# Patient Record
Sex: Female | Born: 1980
Health system: Southern US, Community
[De-identification: ages and names within clinical notes are randomized; demographics above are authoritative.]

## PROBLEM LIST (undated history)

## (undated) DIAGNOSIS — I10 Essential (primary) hypertension: Secondary | ICD-10-CM

## (undated) DIAGNOSIS — J189 Pneumonia, unspecified organism: Secondary | ICD-10-CM

## (undated) DIAGNOSIS — J302 Other seasonal allergic rhinitis: Secondary | ICD-10-CM

## (undated) DIAGNOSIS — E785 Hyperlipidemia, unspecified: Secondary | ICD-10-CM

## (undated) HISTORY — PX: BREAST BIOPSY: SHX20

## (undated) HISTORY — DX: Hyperlipidemia, unspecified: E78.5

## (undated) HISTORY — PX: TUBAL LIGATION: SHX77

## (undated) HISTORY — DX: Essential (primary) hypertension: I10

---

## 2009-05-02 ENCOUNTER — Encounter (INDEPENDENT_AMBULATORY_CARE_PROVIDER_SITE_OTHER): Payer: Self-pay | Admitting: *Deleted

## 2009-05-16 ENCOUNTER — Ambulatory Visit: Payer: Self-pay | Admitting: Family Medicine

## 2009-05-16 DIAGNOSIS — R42 Dizziness and giddiness: Secondary | ICD-10-CM | POA: Insufficient documentation

## 2009-05-16 LAB — CONVERTED CEMR LAB
BUN: 9 mg/dL (ref 6–23)
Basophils Absolute: 0 10*3/uL (ref 0.0–0.1)
Basophils Relative: 0.1 % (ref 0.0–3.0)
CO2: 28 meq/L (ref 19–32)
Calcium: 9 mg/dL (ref 8.4–10.5)
Chloride: 108 meq/L (ref 96–112)
Creatinine, Ser: 0.6 mg/dL (ref 0.4–1.2)
Eosinophils Absolute: 0.1 10*3/uL (ref 0.0–0.7)
Eosinophils Relative: 2.8 % (ref 0.0–5.0)
Folate: 9.8 ng/mL
GFR calc non Af Amer: 124.71 mL/min (ref >60)
Glucose, Bld: 77 mg/dL (ref 70–99)
HCT: 37.1 % (ref 36.0–46.0)
Hemoglobin: 12.7 g/dL (ref 12.0–15.0)
Lymphocytes Relative: 38.5 % (ref 12.0–46.0)
Lymphs Abs: 1.7 10*3/uL (ref 0.7–4.0)
MCHC: 34.3 g/dL (ref 30.0–36.0)
MCV: 90.4 fL (ref 78.0–100.0)
Monocytes Absolute: 0.3 10*3/uL (ref 0.1–1.0)
Monocytes Relative: 7.7 % (ref 3.0–12.0)
Neutro Abs: 2.3 10*3/uL (ref 1.4–7.7)
Neutrophils Relative %: 50.9 % (ref 43.0–77.0)
Platelets: 226 10*3/uL (ref 150.0–400.0)
Potassium: 3.8 meq/L (ref 3.5–5.1)
RBC: 4.11 M/uL (ref 3.87–5.11)
RDW: 12.2 % (ref 11.5–14.6)
Sodium: 141 meq/L (ref 135–145)
TSH: 1.46 microintl units/mL (ref 0.35–5.50)
Vitamin B-12: 172 pg/mL — ABNORMAL LOW (ref 211–911)
WBC: 4.4 10*3/uL — ABNORMAL LOW (ref 4.5–10.5)

## 2009-05-17 LAB — CONVERTED CEMR LAB: Vit D, 25-Hydroxy: 20 ng/mL — ABNORMAL LOW (ref 30–89)

## 2009-05-20 ENCOUNTER — Telehealth (INDEPENDENT_AMBULATORY_CARE_PROVIDER_SITE_OTHER): Payer: Self-pay | Admitting: *Deleted

## 2009-05-22 ENCOUNTER — Encounter: Payer: Self-pay | Admitting: Cardiology

## 2009-05-22 ENCOUNTER — Ambulatory Visit: Payer: Self-pay

## 2009-05-23 ENCOUNTER — Ambulatory Visit: Payer: Self-pay | Admitting: Family Medicine

## 2009-05-23 DIAGNOSIS — E538 Deficiency of other specified B group vitamins: Secondary | ICD-10-CM | POA: Insufficient documentation

## 2009-05-30 ENCOUNTER — Ambulatory Visit: Payer: Self-pay | Admitting: Family Medicine

## 2009-05-30 DIAGNOSIS — E559 Vitamin D deficiency, unspecified: Secondary | ICD-10-CM | POA: Insufficient documentation

## 2009-05-30 DIAGNOSIS — F411 Generalized anxiety disorder: Secondary | ICD-10-CM | POA: Insufficient documentation

## 2009-05-30 LAB — CONVERTED CEMR LAB: Calcium: 9.4 mg/dL (ref 8.4–10.5)

## 2009-06-04 ENCOUNTER — Telehealth (INDEPENDENT_AMBULATORY_CARE_PROVIDER_SITE_OTHER): Payer: Self-pay | Admitting: *Deleted

## 2009-06-04 LAB — CONVERTED CEMR LAB: Vit D, 25-Hydroxy: 45 ng/mL (ref 30–89)

## 2009-06-06 ENCOUNTER — Ambulatory Visit: Payer: Self-pay | Admitting: Family Medicine

## 2009-06-13 ENCOUNTER — Ambulatory Visit: Payer: Self-pay | Admitting: Family Medicine

## 2009-07-04 ENCOUNTER — Ambulatory Visit: Payer: Self-pay | Admitting: Family Medicine

## 2009-08-05 ENCOUNTER — Ambulatory Visit: Payer: Self-pay | Admitting: Family Medicine

## 2009-08-05 ENCOUNTER — Encounter (INDEPENDENT_AMBULATORY_CARE_PROVIDER_SITE_OTHER): Payer: Self-pay | Admitting: *Deleted

## 2010-11-14 ENCOUNTER — Ambulatory Visit
Admission: RE | Admit: 2010-11-14 | Discharge: 2010-11-14 | Payer: Self-pay | Source: Home / Self Care | Attending: Family Medicine | Admitting: Family Medicine

## 2010-11-18 NOTE — Assessment & Plan Note (Signed)
Summary: b12 shot/kdc  Nurse Visit   Allergies: No Known Drug Allergies  Medication Administration  Injection # 1:    Medication: Vit B12 1000 mcg    Diagnosis: B12 DEFICIENCY (ICD-266.2)    Route: IM    Site: L deltoid    Exp Date: 02/2011    Lot #: [id number]    Mfr: American Regent    Patient tolerated injection without complications    Given by: Floydene Flock CMA (June 13, 2009 9:51 AM)  Orders Added: 1)  Admin of Therapeutic Inj  intramuscular or subcutaneous [96372] 2)  Vit B12 1000 mcg [J3420]   Medication Administration  Injection # 1:    Medication: Vit B12 1000 mcg    Diagnosis: B12 DEFICIENCY (ICD-266.2)    Route: IM    Site: L deltoid    Exp Date: 02/2011    Lot #: [id number]    Mfr: American Regent    Patient tolerated injection without complications    Given by: Floydene Flock CMA (June 13, 2009 9:51 AM)  Orders Added: 1)  Admin of Therapeutic Inj  intramuscular or subcutaneous [96372] 2)  Vit B12 1000 mcg [J3420]

## 2010-11-18 NOTE — Assessment & Plan Note (Signed)
Summary: RTO 2 WEEKS.CBS b12/kdc   Vital Signs:  Patient profile:   30 year old female Weight:      162.2 pounds Pulse rate:   54 / minute BP sitting:   110 / 58  (left arm)  Vitals Entered By: Doristine Devoid (May 30, 2009 9:42 AM) CC: 2 WEEK ROA- no dizziness occassionally light headed   History of Present Illness: 30 yo woman here today for f/u on dizziness.  pt reports dizziness is gone.  was having nausea w/ Vit D- took daily rather than weekly.  no chest pain, SOB, bone pain.  still having disconnected feeling- feels 'empty and anxious', 'light and airy', 'like a wave coming over me'.  reports there is a lot of stress at home- reports that she may be headed for 2nd divorce at age 43.  has 3 kids involved.  states she and husband have thought about counseling but have never followed through.  pt is tearful during visit.  Problems Prior to Update: 1)  Anxiety  (ICD-300.00) 2)  Vitamin D Deficiency  (ICD-268.9) 3)  B12 Deficiency  (ICD-266.2) 4)  Dizziness  (ICD-780.4)  Current Medications (verified): 1)  Meclizine Hcl 25 Mg Tabs (Meclizine Hcl) .Marland Kitchen.. 1 By Mouth Q6 As Needed For Dizziness 2)  Vitamin D (Ergocalciferol) 50000 Unit Caps (Ergocalciferol) .... 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 Eyes:  Denies blurring and double vision. CV:  Denies lightheadness, near fainting, palpitations, shortness of breath with exertion, swelling of feet, and swelling of hands. GI:  Denies abdominal pain, nausea, and vomiting. MS:  Denies joint pain and cramps. Psych:  Complains of anxiety, depression, easily angered, easily tearful, and irritability.  Physical Exam  General:  Well-developed,well-nourished,in no  acute distress; alert,appropriate and cooperative throughout examination Lungs:  Normal respiratory effort, chest expands symmetrically. Lungs are clear to auscultation, no crackles or wheezes. Heart:  reg S1/S2, no M/R/G Pulses:  +2 carotid, radial, DP Extremities:  no C/C/E Psych:  tearful, obviously anxious, depressed   Impression & Recommendations:  Problem # 1:  DIZZINESS (ICD-780.4) Assessment Improved pt reports no additional episodes of dizziness.  holter monitor completed but no results available, will continue to follow. Her updated medication list for this problem includes:    Meclizine Hcl 25 Mg Tabs (Meclizine hcl) .Marland Kitchen... 1 by mouth q6 as needed for dizziness  Problem # 2:  VITAMIN D DEFICIENCY (ICD-268.9) Assessment: New pt repleted Vit D in 8 days rather than 8 weeks.  had some nausea and HAs while taking meds but now asymptomatic.  repeat EKG unchanged from baseline- no arrhythmia present.  check Vit D and Ca levels.  pt aware of medication mistake when called into pharmacy and accepted both my and nurse's apology. Orders: Venipuncture (78295) T-Vitamin D (25-Hydroxy) (62130-86578) TLB-Calcium (82310-CA)  Problem # 3:  ANXIETY (ICD-300.00) Assessment: New pt obviously upset about current marriage situation.  referred for counseling.  spent >30 minutes discussing current situation and pt's feelings about it.  pt in agreement that sxs of 'disconnected' are likely stress related.  will follow closely.  Complete Medication List: 1)  Meclizine Hcl 25 Mg Tabs (Meclizine hcl) .Marland Kitchen.. 1 by mouth q6 as needed for dizziness 2)  Vitamin D (ergocalciferol) 50000 Unit Caps (  Ergocalciferol) .... Take one tablet weekly  Other Orders: Admin of Therapeutic Inj  intramuscular or subcutaneous (86578) Vit B12 1000 mcg (I6962)  Patient Instructions: 1)  Please schedule a follow-up appointment in 6 weeks to follow up anxiety. 2)  Try and find a stress outlet 3)  We'll notify you of your  labs 4)  Hold off on your Caltrate for the next 6 weeks- until your next visit 5)  We'll call you when we get your Holter monitor results (they still haven't faxed them!) 6)  Please call and schedule some counseling w/ [facility] at [phone] 7)  Call with any questions or concerns 8)  HANG IN THERE!!!  You can do this!    Medication Administration  Injection # 1:    Medication: Vit B12 1000 mcg    Diagnosis: B12 DEFICIENCY (ICD-266.2)    Route: IM    Site: L deltoid    Exp Date: 12/2010    Lot #: [id number]    Mfr: American Regent    Patient tolerated injection without complications    Given by: Floydene Flock CMA (May 30, 2009 11:09 AM)  Orders Added: 1)  Venipuncture [24401] 2)  T-Vitamin D (25-Hydroxy) [phone] 3)  Admin of Therapeutic Inj  intramuscular or subcutaneous [96372] 4)  Vit B12 1000 mcg [J3420] 5)  TLB-Calcium [82310-CA] 6)  Est. Patient Level IV [03474]

## 2010-11-18 NOTE — Assessment & Plan Note (Signed)
Summary: rov/swh//b12 inj//lh   Vital Signs:  Patient profile:   30 year old female Weight:      161.8 pounds Pulse rate:   62 / minute BP sitting:   90 / 60  (left arm)  Vitals Entered By: Doristine Devoid (July 04, 2009 10:12 AM) CC: rov and b12 shot   History of Present Illness: 30 yo woman here today to f/u  1) anxiety- has not started counseling due to financial reasons.  she and husband are 'trying to be on our best behavior'.  'we're making a better effort to work on Therapist, music'.  denies any dizzy spells since she and her husband have been on better terms.  doesn't feel she needs meds at this time.  Problems Prior to Update: 1)  Anxiety  (ICD-300.00) 2)  Vitamin D Deficiency  (ICD-268.9) 3)  B12 Deficiency  (ICD-266.2) 4)  Dizziness  (ICD-780.4)  Current Medications (verified): 1)  Meclizine Hcl 25 Mg Tabs (Meclizine Hcl) .Marland Kitchen.. 1 By Mouth Q6 As Needed For Dizziness 2)  Vitamin D (Ergocalciferol) 50000 Unit Caps (Ergocalciferol) .... Take One Tablet Weekly  Allergies (verified): No Known Drug Allergies  Past History:  Social History: Last updated: 05/16/2009 married, has 3 children- (98, 99, 2002) works at [employer] as Production designer, theatre/television/film  Review of Systems      See HPI  Physical Exam  General:  Well-developed,well-nourished,in no acute distress; alert,appropriate and cooperative throughout examination Psych:  Oriented X3, memory intact for recent and remote, normally interactive, good eye contact, not anxious appearing, and not depressed appearing.     Impression & Recommendations:  Problem # 1:  ANXIETY (ICD-300.00) Assessment Unchanged pt's sxs much improved since working on things w/ husband.  no need for meds at this time.  has not started counseling.  will follow.  Complete Medication List: 1)  Meclizine Hcl 25 Mg Tabs (Meclizine hcl) .Marland Kitchen.. 1 by mouth q6 as needed for dizziness 2)  Vitamin D (ergocalciferol) 50000 Unit Caps (Ergocalciferol) .... Take one tablet  weekly  Other Orders: Vit B12 1000 mcg (J3420) Admin of Therapeutic Inj  intramuscular or subcutaneous (65784)  Patient Instructions: 1)  Schedule a nurse visit in 1 month for B12 injection 2)  Set up a physical at your convenience in the next 2 months- do not eat before this appt 3)  Call with any questions or concerns 4)  Hang in there and keep 'working' at it!   Medication Administration  Injection # 1:    Medication: Vit B12 1000 mcg    Diagnosis: B12 DEFICIENCY (ICD-266.2)    Route: IM    Site: R deltoid    Exp Date: 02/17/2011    Lot #: [id number]    Mfr: American Regent    Given by: Doristine Devoid (July 04, 2009 10:13 AM)  Orders Added: 1)  Vit B12 1000 mcg [J3420] 2)  Admin of Therapeutic Inj  intramuscular or subcutaneous [96372] 3)  Est. Patient Level III [95284]

## 2010-11-18 NOTE — Assessment & Plan Note (Signed)
Summary: b12 injection/swh  Nurse Visit   Allergies: No Known Drug Allergies  Medication Administration  Injection # 1:    Medication: Vit B12 1000 mcg    Diagnosis: B12 DEFICIENCY (ICD-266.2)    Route: IM    Site: R deltoid    Exp Date: 12/18/2010    Lot #: [id number]    Mfr: American Regent    Given by: Doristine Devoid (June 06, 2009 10:38 AM)  Orders Added: 1)  Vit B12 1000 mcg [J3420] 2)  Admin of Therapeutic Inj  intramuscular or subcutaneous [96372]   Medication Administration  Injection # 1:    Medication: Vit B12 1000 mcg    Diagnosis: B12 DEFICIENCY (ICD-266.2)    Route: IM    Site: R deltoid    Exp Date: 12/18/2010    Lot #: [id number]    Mfr: American Regent    Given by: Doristine Devoid (June 06, 2009 10:38 AM)  Orders Added: 1)  Vit B12 1000 mcg [J3420] 2)  Admin of Therapeutic Inj  intramuscular or subcutaneous [45409]

## 2010-11-18 NOTE — Assessment & Plan Note (Signed)
Summary: NEW TO EST.CBS - AWARE N/S   Vital Signs:  Patient profile:   30 year old female Height:      63.25 inches Weight:      159.6 pounds BMI:     28.15 Pulse rate:   64 / minute BP sitting:   120 / 60  (left arm)  Vitals Entered By: Doristine Devoid (May 16, 2009 9:52 AM) CC: new est- dizziness on and off xmar. went away but came back few weeks ago not associated w/ anything in particular   History of Present Illness: 30 yo woman here today to establish care.  hasn't had a regular doctor 'in quite some time'.  here today for dizziness.  [place] w/ [place] and while stepping off the bus got very pale, dizzy, weak, had a hard time focusing.  ate something and still felt shaky.  took a few hours to feel better.  had a few additional spells and then the sxs disappeared.  a few weeks ago sxs returned.  1st sxs was increased fatigue- would sleep as much as possible.  felt motivation to do anything was low.  1 week ago Monday pt felt shaky.  the next morning pt again felt weak, trouble walking w/out holding on, vision blurry.  decreased appetite.  sxs all seem to cluster around meals- breakfast, lunch.  yesterday felt nausea.  no vomiting.  no fevers.  Preventive Screening-Counseling & Management  Alcohol-Tobacco     Alcohol drinks/day: <1     Smoking Status: never  Caffeine-Diet-Exercise     Does Patient Exercise: no      Sexual History:  currently monogamous.        Drug Use:  never.    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 Eyes:  Denies blurring and double vision; during episodes vision w/ 'close in'. ENT:  Denies nosebleeds and ringing in ears. CV:  Denies chest pain or discomfort, shortness of breath with exertion, swelling of feet, and swelling of hands. Resp:  Denies cough and wheezing. GI:  Complains of loss of appetite and nausea; denies change in bowel habits and vomiting. MS:  Denies joint pain and muscle weakness. Neuro:  Complains of poor balance, sensation of room spinning, and visual disturbances; denies falling down, headaches, and seizures. Psych:  Complains of irritability; denies anxiety, depression, easily angered, and easily tearful.  Physical Exam  General:  Well-developed,well-nourished,in no acute distress; alert,appropriate and cooperative throughout examination Head:  Normocephalic and atraumatic without obvious abnormalities. No apparent alopecia or balding. Eyes:  No corneal or conjunctival inflammation noted. EOMI. Perrla. Funduscopic exam benign, without hemorrhages, exudates or papilledema. Vision grossly normal. Ears:  External ear exam shows no significant lesions or deformities.  Otoscopic examination reveals clear canals, tympanic membranes are intact bilaterally without bulging, retraction, inflammation or discharge. Hearing is grossly normal bilaterally. Nose:  External nasal examination shows no deformity or inflammation. Nasal mucosa are pink and moist without lesions or exudates. Mouth:  Oral mucosa and oropharynx without  lesions or exudates.  Teeth in good repair. Neck:  No deformities, masses, or tenderness noted. Lungs:  Normal respiratory effort, chest expands symmetrically. Lungs are clear to auscultation, no crackles or wheezes. Heart:  reg S1/S2, no M/R/G Abdomen:  soft, NT/ND, +BS Pulses:  +2 carotid, radial, DP Extremities:  no C/C/E Neurologic:  No cranial nerve deficits noted.  Station and gait are normal. Plantar reflexes are down-going bilaterally. DTRs are symmetrical throughout. Sensory, motor and coordinative functions appear intact.  tandem gait intact, (-) Romberg   Impression & Recommendations:  Problem # 1:  DIZZINESS (ICD-780.4) Assessment New pt's dizziness w/out clear etiology based on hx or PE.  EKG showed irregular sinus bradycardia.  will arrange for 48 hr holter monitor to r/o symptomatic bradycardia.  check labs to r/o DM, anemia, thyroid dz, vitamin deficiency.  encouraged small but frequent meals to avoid rapid changes in blood sugars.  reviewed supportive care and red flags that should prompt return.  Pt expresses understanding and is in agreement w/ this plan.  34 minutes spent w/ pt. Orders: Venipuncture (57846) T-Vitamin D (25-Hydroxy) [phone]) TLB-BMP (Basic Metabolic Panel-BMET) (80048-METABOL) TLB-CBC Platelet - w/Differential (85025-CBCD) TLB-TSH (Thyroid Stimulating Hormone) (84443-TSH) TLB-B12 + Folate Pnl ([id number]-D66/YQI) Cardiology Referral (Cardiology)  Her updated medication list for this problem includes:    Meclizine Hcl 25 Mg Tabs (Meclizine hcl) .Marland Kitchen... 1 by mouth q6 as needed for dizziness  Complete Medication List: 1)  Meclizine Hcl 25 Mg Tabs (Meclizine hcl) .Marland Kitchen.. 1 by mouth q6 as needed for dizziness  Patient Instructions: 1)  Please schedule a follow-up appointment in 2 weeks to discuss dizziness. 2)  Someone will contact you to arrange your holter monitor 3)  Make sure you are eating small amounts but frequently- this will avoid your blood sugar dropping low and then flooding your system 4)  Drink plenty of fluids- especially in this heat 5)  We'll notify you of your lab results 6)  Take the Meclizine as needed for dizziness 7)  Call with any questions or concerns 8)  Hang in there! Prescriptions: MECLIZINE HCL 25 MG TABS (MECLIZINE HCL) 1 by mouth Q6 as needed for dizziness  #45 x 0   Entered and  Authorized by:   Neena Rhymes MD   Signed by:   Neena Rhymes MD on 05/16/2009   Method used:   Print then Give to Patient   RxID:   [id number]

## 2010-11-18 NOTE — Procedures (Signed)
Summary: summary report  summary report   Imported By: Mirna Mires 06/03/2009 09:59:22  _____________________________________________________________________  External Attachment:    Type:   Image     Comment:   External Document

## 2010-11-18 NOTE — Progress Notes (Signed)
Summary: labs  Phone Note Outgoing Call   Call placed by: Doristine Devoid,  June 04, 2009 10:53 AM Call placed to: Patient Summary of Call: Vit D and Ca are both normal and in center of the normal range.  so despite taking the Vit D for 8 days in a row, her levels are normal and she should have no problems.  please call  Follow-up for Phone Call        patient aware of labs................Marland KitchenDoristine Devoid  June 04, 2009 10:53 AM

## 2010-11-18 NOTE — Letter (Signed)
Summary: [place] No Show Letter  [facility] [place]  [address] [place], [place] [address]   Phone: [phone]  Fax: [phone]    08/05/2009 MRN: [id number]  Rose Bell [address] [place], [place]  [address]   Dear Ms. Bjorn,   Our records indicate that you missed your scheduled appointment with ________Dr.Tabori________ on _____10/18/10______.  Please contact this office to reschedule your appointment as soon as possible.  It is important that you keep your scheduled appointments with your physician, so we can provide you the best care possible.  Please be advised that there may be a charge for "no show" appointments.    Sincerely,    at [facility]

## 2010-11-18 NOTE — Progress Notes (Signed)
Summary: discuss lab  Phone Note Outgoing Call   Call placed by: Doristine Devoid,  May 20, 2009 10:15 AM Summary of Call: pt needs 50,000 units of Vit D x8 weeks and then recheck labs.  B12 deficiency- can replete w/ injections weekly x1 month and then monthly or use the nasal spray weekly.  remainder of labs normal.  Follow-up for Phone Call        spoke with patient aware of labs and recommendations appt scheduled to for b12 shots..............Marland KitchenDoristine Devoid  May 20, 2009 10:16 AM     New/Updated Medications: VITAMIN D (ERGOCALCIFEROL) 50000 UNIT CAPS (ERGOCALCIFEROL) take one tablet daily Prescriptions: VITAMIN D (ERGOCALCIFEROL) 50000 UNIT CAPS (ERGOCALCIFEROL) take one tablet daily  #8 x 0   Entered by:   Doristine Devoid   Authorized by:   Neena Rhymes MD   Signed by:   Doristine Devoid on 05/20/2009   Method used:   Electronically to        [facility]. [phone]* (retail)       [address]       [place], [place]  [address]       Ph: [phone]       Fax: [phone]   RxID:   [id number]

## 2010-11-26 NOTE — Assessment & Plan Note (Signed)
Summary: FOR CONSULT/ ABOUT HER SON///PH   Vital Signs:  Patient profile:   30 year old female Weight:      154 pounds BMI:     27.16 Pulse rate:   62 / minute  Vitals Entered By: Doristine Devoid CMA (November 14, 2010 3:00 PM) CC: discuss son and ADHD   History of Present Illness: 30 yo woman here today for anxiety.  pt concerned about her son, age 26 (he will be a pt here).  reports he has always had borderline grades but this year he has dropped 'way below grade level'.  school wants him tested for ADHD.  this is upsetting to mom.  his behavior is disruptive, he can't focus.  mom reports he is active at home.  son attends [employer]- they are calling pt frequently.  also having stress w/ bad marriage, school, full time work- but is losing her job.  has again started panic attacks.  still not interested in meds.    Current Medications (verified): 1)  None  Allergies (verified): No Known Drug Allergies  Past History:  Past medical, surgical, family and social histories (including risk factors) reviewed for relevance to current acute and chronic problems.  Past Medical History: Reviewed history from 05/16/2009 and no changes required. none  Past Surgical History: Reviewed history from 05/16/2009 and no changes required. Tubal ligation  Family History: Reviewed history from 05/16/2009 and no changes required. CAD-no HTN-no DM-paternal aunt and uncle STROKE-no COLON CA-no BREAST CA-no mom w/ thyroid condition  Social History: Reviewed history from 05/16/2009 and no changes required. married, has 3 children- (98, 99, 2002) works at [employer] as Production designer, theatre/television/film  Review of Systems      See HPI  Physical Exam  General:  Well-developed,well-nourished,in no acute distress; alert,appropriate and cooperative throughout examination Psych:  Oriented X3, memory intact for recent and remote, normally interactive, good eye contact, rapid speech, mildly anxious.  not  depressed.   Impression & Recommendations:  Problem # 1:  ANXIETY (ICD-300.00) Assessment Deteriorated pt's anxiety previously well controlled but she is now having panic attacks again due to stress of losing her job, son's possible ADHD and problems w/ school.  she is still not interested in meds- 'i just wanted to talk to you'.  discussed her current stressors and possible strategies for dealing w/ them- including having son tested by the school system.  will follow pt's mood closely.  total time spent w/ pt- 31 minutes, >50% spent counseling pt.  Patient Instructions: 1)  Follow up at your convenience for your complete physical- do not eat before this appt 2)  Go ahead and proceed w/ school testing for your son 3)  Call if think you need to start meds for the anxiety 4)  You can do this!!!   Orders Added: 1)  Est. Patient Level IV [16109]

## 2011-05-25 ENCOUNTER — Encounter: Payer: Self-pay | Admitting: Family Medicine

## 2011-06-26 ENCOUNTER — Other Ambulatory Visit (HOSPITAL_COMMUNITY)
Admission: RE | Admit: 2011-06-26 | Discharge: 2011-06-26 | Disposition: A | Discharge status: Home / Self Care | Payer: 59 | Source: Ambulatory Visit | Attending: Family Medicine | Admitting: Family Medicine

## 2011-06-26 ENCOUNTER — Encounter: Payer: Self-pay | Admitting: Family Medicine

## 2011-06-26 ENCOUNTER — Ambulatory Visit (INDEPENDENT_AMBULATORY_CARE_PROVIDER_SITE_OTHER): Payer: 59 | Admitting: Family Medicine

## 2011-06-26 DIAGNOSIS — E559 Vitamin D deficiency, unspecified: Secondary | ICD-10-CM

## 2011-06-26 DIAGNOSIS — Z0000 Encounter for general adult medical examination without abnormal findings: Secondary | ICD-10-CM | POA: Insufficient documentation

## 2011-06-26 DIAGNOSIS — Z01419 Encounter for gynecological examination (general) (routine) without abnormal findings: Secondary | ICD-10-CM

## 2011-06-26 DIAGNOSIS — Z124 Encounter for screening for malignant neoplasm of cervix: Secondary | ICD-10-CM | POA: Insufficient documentation

## 2011-06-26 DIAGNOSIS — E538 Deficiency of other specified B group vitamins: Secondary | ICD-10-CM

## 2011-06-26 LAB — CBC WITH DIFFERENTIAL/PLATELET
Basophils Absolute: 0 10*3/uL (ref 0.0–0.1)
Basophils Relative: 0.5 % (ref 0.0–3.0)
Eosinophils Absolute: 0.2 10*3/uL (ref 0.0–0.7)
Eosinophils Relative: 2.9 % (ref 0.0–5.0)
HCT: 36.8 % (ref 36.0–46.0)
Hemoglobin: 12.3 g/dL (ref 12.0–15.0)
Lymphocytes Relative: 36.3 % (ref 12.0–46.0)
Lymphs Abs: 1.9 10*3/uL (ref 0.7–4.0)
MCHC: 33.5 g/dL (ref 30.0–36.0)
MCV: 92.6 fl (ref 78.0–100.0)
Monocytes Absolute: 0.4 10*3/uL (ref 0.1–1.0)
Monocytes Relative: 7.2 % (ref 3.0–12.0)
Neutro Abs: 2.7 10*3/uL (ref 1.4–7.7)
Neutrophils Relative %: 53.1 % (ref 43.0–77.0)
Platelets: 235 10*3/uL (ref 150.0–400.0)
RBC: 3.97 Mil/uL (ref 3.87–5.11)
RDW: 12.5 % (ref 11.5–14.6)
WBC: 5.2 10*3/uL (ref 4.5–10.5)

## 2011-06-26 LAB — LIPID PANEL
Cholesterol: 265 mg/dL — ABNORMAL HIGH (ref 0–200)
HDL: 51.2 mg/dL (ref >39.00)
Total CHOL/HDL Ratio: 5
Triglycerides: 96 mg/dL (ref 0.0–149.0)
VLDL: 19.2 mg/dL (ref 0.0–40.0)

## 2011-06-26 LAB — BASIC METABOLIC PANEL
BUN: 12 mg/dL (ref 6–23)
CO2: 26 mEq/L (ref 19–32)
Calcium: 9 mg/dL (ref 8.4–10.5)
Chloride: 106 mEq/L (ref 96–112)
Creatinine, Ser: 0.7 mg/dL (ref 0.4–1.2)
GFR: 99.67 mL/min (ref >60.00)
Glucose, Bld: 90 mg/dL (ref 70–99)
Potassium: 3.7 mEq/L (ref 3.5–5.1)
Sodium: 141 mEq/L (ref 135–145)

## 2011-06-26 LAB — LDL CHOLESTEROL, DIRECT: Direct LDL: 199.8 mg/dL

## 2011-06-26 LAB — HEPATIC FUNCTION PANEL
ALT: 13 U/L (ref 0–35)
AST: 16 U/L (ref 0–37)
Albumin: 4.2 g/dL (ref 3.5–5.2)
Alkaline Phosphatase: 57 U/L (ref 39–117)
Bilirubin, Direct: 0.1 mg/dL (ref 0.0–0.3)
Total Bilirubin: 0.8 mg/dL (ref 0.3–1.2)
Total Protein: 7 g/dL (ref 6.0–8.3)

## 2011-06-26 LAB — TSH: TSH: 1.35 u[IU]/mL (ref 0.35–5.50)

## 2011-06-26 LAB — VITAMIN B12: Vitamin B-12: 391 pg/mL (ref 211–911)

## 2011-06-26 NOTE — Progress Notes (Signed)
  Subjective:    Patient ID: Rose Bell, female    DOB: 10-15-1981, 30 y.o.   MRN: [id number]  HPI CPE- no concerns about health today.   Review of Systems Patient reports no vision/ hearing changes, adenopathy,fever, weight change,  persistant/recurrent hoarseness , swallowing issues, chest pain, palpitations, edema, persistant/recurrent cough, hemoptysis, dyspnea (rest/exertional/paroxysmal nocturnal), gastrointestinal bleeding (melena, rectal bleeding), abdominal pain, significant heartburn, bowel changes, GU symptoms (dysuria, hematuria, incontinence), Gyn symptoms (abnormal  bleeding, pain),  syncope, focal weakness, memory loss, numbness & tingling, skin/hair/nail changes, abnormal bruising or bleeding, anxiety, or depression.     Objective:   Physical Exam  General Appearance:    Alert, cooperative, no distress, appears stated age  Head:    Normocephalic, without obvious abnormality, atraumatic  Eyes:    PERRL, conjunctiva/corneas clear, EOM's intact, fundi    benign, both eyes  Ears:    Normal TM's and external ear canals, both ears  Nose:   Nares normal, septum midline, mucosa normal, no drainage    or sinus tenderness  Throat:   Lips, mucosa, and tongue normal; teeth and gums normal  Neck:   Supple, symmetrical, trachea midline, no adenopathy;    Thyroid: no enlargement/tenderness/nodules  Back:     Symmetric, no curvature, ROM normal, no CVA tenderness  Lungs:     Clear to auscultation bilaterally, respirations unlabored  Chest Wall:    No tenderness or deformity   Heart:    Regular rate and rhythm, S1 and S2 normal, no murmur, rub   or gallop  Breast Exam:    No tenderness, masses, or nipple abnormality  Abdomen:     Soft, non-tender, bowel sounds active all four quadrants,    no masses, no organomegaly  Genitalia:    External genitalia normal, cervix normal in appearance, no CMT, uterus in normal size and position, adnexa w/out mass or tenderness, mucosa pink and moist,  no lesions or discharge present  Rectal:    Normal external appearance  Extremities:   Extremities normal, atraumatic, no cyanosis or edema  Pulses:   2+ and symmetric all extremities  Skin:   Skin color, texture, turgor normal, no rashes or lesions  Lymph nodes:   Cervical, supraclavicular, and axillary nodes normal  Neurologic:   CNII-XII intact, normal strength, sensation and reflexes    throughout          Assessment & Plan:

## 2011-06-26 NOTE — Patient Instructions (Signed)
Follow up in 1 year or as needed We'll notify you of your lab results Try and get regular exercise and make healthy food choices Call with any questions or concerns Hang in there!!!

## 2011-06-28 NOTE — Assessment & Plan Note (Signed)
Pt's PE WNL.  Check labs.  Encouraged healthy diet and regular exercise.  Anticipatory guidance provided.  

## 2011-06-28 NOTE — Assessment & Plan Note (Signed)
Check labs- supplement prn.

## 2011-06-28 NOTE — Assessment & Plan Note (Signed)
Pap collected. 

## 2011-06-28 NOTE — Assessment & Plan Note (Signed)
Check labs.  Supplement prn.

## 2011-06-29 ENCOUNTER — Telehealth: Payer: Self-pay

## 2011-06-29 LAB — VITAMIN D 1,25 DIHYDROXY
Vitamin D 1, 25 (OH)2 Total: 105 pg/mL — ABNORMAL HIGH (ref 18–72)
Vitamin D2 1, 25 (OH)2: <8 pg/mL
Vitamin D3 1, 25 (OH)2: 105 pg/mL

## 2011-06-29 NOTE — Progress Notes (Signed)
Pt aware.

## 2011-06-29 NOTE — Telephone Encounter (Signed)
Pt notified and verbalized understanding. She decides that she will work on the diet and exercise instead of starting the medication.

## 2011-06-29 NOTE — Telephone Encounter (Signed)
Message copied by Beverely Low on Mon Jun 29, 2011 11:29 AM ------      Message from: Sheliah Hatch      Created: Sun Jun 28, 2011  8:15 PM       Total cholesterol and LDL are both elevated- need to reduce by ~50%.  Would recommend working on diet and exercise for next 6 months and then rechecking labs.  If pt doesn't think she can do diet and exercise can start Lipitor 20mg  nightly and recheck LFTs in 6-8 weeks (dx 272.4)

## 2011-07-17 ENCOUNTER — Telehealth: Payer: Self-pay | Admitting: Family Medicine

## 2011-07-17 NOTE — Telephone Encounter (Signed)
Patient dropped off a form from her insurance co, she would like to pick it up ASAP

## 2011-07-22 NOTE — Telephone Encounter (Signed)
Forms faxed to pt's employer and pt aware

## 2011-08-19 ENCOUNTER — Telehealth: Payer: Self-pay | Admitting: Family Medicine

## 2011-08-19 NOTE — Telephone Encounter (Signed)
Error msg is for son

## 2012-03-04 ENCOUNTER — Ambulatory Visit (INDEPENDENT_AMBULATORY_CARE_PROVIDER_SITE_OTHER): Payer: 59 | Admitting: Family Medicine

## 2012-03-04 ENCOUNTER — Encounter: Payer: Self-pay | Admitting: Family Medicine

## 2012-03-04 VITALS — BP 127/78 | HR 91 | Temp 98.5°F | Ht 63.0 in | Wt 179.6 lb

## 2012-03-04 DIAGNOSIS — E538 Deficiency of other specified B group vitamins: Secondary | ICD-10-CM

## 2012-03-04 DIAGNOSIS — E559 Vitamin D deficiency, unspecified: Secondary | ICD-10-CM

## 2012-03-04 DIAGNOSIS — K219 Gastro-esophageal reflux disease without esophagitis: Secondary | ICD-10-CM | POA: Insufficient documentation

## 2012-03-04 DIAGNOSIS — N926 Irregular menstruation, unspecified: Secondary | ICD-10-CM

## 2012-03-04 DIAGNOSIS — Z01419 Encounter for gynecological examination (general) (routine) without abnormal findings: Secondary | ICD-10-CM

## 2012-03-04 LAB — CBC WITH DIFFERENTIAL/PLATELET
Basophils Absolute: 0 10*3/uL (ref 0.0–0.1)
Basophils Relative: 0.3 % (ref 0.0–3.0)
Eosinophils Absolute: 0.1 10*3/uL (ref 0.0–0.7)
Eosinophils Relative: 2 % (ref 0.0–5.0)
HCT: 38.2 % (ref 36.0–46.0)
Hemoglobin: 12.8 g/dL (ref 12.0–15.0)
Lymphocytes Relative: 34.7 % (ref 12.0–46.0)
Lymphs Abs: 2.5 10*3/uL (ref 0.7–4.0)
MCHC: 33.4 g/dL (ref 30.0–36.0)
MCV: 91.1 fl (ref 78.0–100.0)
Monocytes Absolute: 0.5 10*3/uL (ref 0.1–1.0)
Monocytes Relative: 7.1 % (ref 3.0–12.0)
Neutro Abs: 4 10*3/uL (ref 1.4–7.7)
Neutrophils Relative %: 55.9 % (ref 43.0–77.0)
Platelets: 243 10*3/uL (ref 150.0–400.0)
RBC: 4.2 Mil/uL (ref 3.87–5.11)
RDW: 13.2 % (ref 11.5–14.6)
WBC: 7.1 10*3/uL (ref 4.5–10.5)

## 2012-03-04 LAB — BASIC METABOLIC PANEL
BUN: 10 mg/dL (ref 6–23)
CO2: 23 mEq/L (ref 19–32)
Calcium: 9 mg/dL (ref 8.4–10.5)
Chloride: 106 mEq/L (ref 96–112)
Creatinine, Ser: 0.7 mg/dL (ref 0.4–1.2)
GFR: 107.83 mL/min (ref >60.00)
Glucose, Bld: 78 mg/dL (ref 70–99)
Potassium: 3.5 mEq/L (ref 3.5–5.1)
Sodium: 139 mEq/L (ref 135–145)

## 2012-03-04 LAB — HEPATIC FUNCTION PANEL
ALT: 16 U/L (ref 0–35)
AST: 19 U/L (ref 0–37)
Albumin: 4.1 g/dL (ref 3.5–5.2)
Alkaline Phosphatase: 61 U/L (ref 39–117)
Bilirubin, Direct: 0 mg/dL (ref 0.0–0.3)
Total Bilirubin: 0.7 mg/dL (ref 0.3–1.2)
Total Protein: 6.9 g/dL (ref 6.0–8.3)

## 2012-03-04 LAB — VITAMIN B12: Vitamin B-12: 479 pg/mL (ref 211–911)

## 2012-03-04 LAB — LIPID PANEL
Cholesterol: 254 mg/dL — ABNORMAL HIGH (ref 0–200)
HDL: 49.3 mg/dL (ref >39.00)
Total CHOL/HDL Ratio: 5
Triglycerides: 135 mg/dL (ref 0.0–149.0)
VLDL: 27 mg/dL (ref 0.0–40.0)

## 2012-03-04 LAB — LDL CHOLESTEROL, DIRECT: Direct LDL: 199.3 mg/dL

## 2012-03-04 LAB — TSH: TSH: 1.77 u[IU]/mL (ref 0.35–5.50)

## 2012-03-04 MED ORDER — OMEPRAZOLE 40 MG PO CPDR: 40.0000 mg | DELAYED_RELEASE_CAPSULE | Freq: Every day | ORAL | Status: DC

## 2012-03-04 NOTE — Patient Instructions (Signed)
We'll notify you of your lab results We'll call you with your GYN appt Start the Omeprazole daily for the reflux Call with any questions or concerns Hang in there!  You look great! Happy Early [place] Ouch!!!

## 2012-03-04 NOTE — Progress Notes (Signed)
  Subjective:    Patient ID: Rose Bell, female    DOB: Sep 14, 1981, 31 y.o.   MRN: [id number]  HPI CPE- wants to find GYN due to menstrual irregularity.   Review of Systems Patient reports no vision/ hearing changes, adenopathy,fever, weight change,  persistant/recurrent hoarseness , swallowing issues, palpitations, edema, persistant/recurrent cough, hemoptysis, dyspnea (rest/exertional/paroxysmal nocturnal), gastrointestinal bleeding (melena, rectal bleeding), abdominal pain, bowel changes, GU symptoms (dysuria, hematuria, incontinence),  syncope, focal weakness, memory loss, numbness & tingling, skin/hair/nail changes, abnormal bruising or bleeding, anxiety, or depression.   + chest pain- hx of similar, it was anxiety at that time.  Described as a tightness, w/ burning sensation.  Woke her from sleep.  + sour brash.  Having daytime reflux sxs.    Objective:   Physical Exam General Appearance:    Alert, cooperative, no distress, appears stated age  Head:    Normocephalic, without obvious abnormality, atraumatic  Eyes:    PERRL, conjunctiva/corneas clear, EOM's intact, fundi    benign, both eyes  Ears:    Normal TM's and external ear canals, both ears  Nose:   Nares normal, septum midline, mucosa normal, no drainage    or sinus tenderness  Throat:   Lips, mucosa, and tongue normal; teeth and gums normal  Neck:   Supple, symmetrical, trachea midline, no adenopathy;    Thyroid: no enlargement/tenderness/nodules  Back:     Symmetric, no curvature, ROM normal, no CVA tenderness  Lungs:     Clear to auscultation bilaterally, respirations unlabored  Chest Wall:    No tenderness or deformity   Heart:    Regular rate and rhythm, S1 and S2 normal, no murmur, rub   or gallop  Breast Exam:    Deferred to GYN  Abdomen:     Soft, non-tender, bowel sounds active all four quadrants,    no masses, no organomegaly  Genitalia:    Deferred to GYN  Rectal:    Extremities:   Extremities normal,  atraumatic, no cyanosis or edema  Pulses:   2+ and symmetric all extremities  Skin:   Skin color, texture, turgor normal, no rashes or lesions  Lymph nodes:   Cervical, supraclavicular, and axillary nodes normal  Neurologic:   CNII-XII intact, normal strength, sensation and reflexes    throughout         Assessment & Plan:

## 2012-03-07 ENCOUNTER — Encounter: Payer: Self-pay | Admitting: *Deleted

## 2012-03-07 LAB — VITAMIN D 1,25 DIHYDROXY
Vitamin D 1, 25 (OH)2 Total: 76 pg/mL — ABNORMAL HIGH (ref 18–72)
Vitamin D2 1, 25 (OH)2: <8 pg/mL
Vitamin D3 1, 25 (OH)2: 76 pg/mL

## 2012-03-07 MED ORDER — ATORVASTATIN CALCIUM 20 MG PO TABS: 20.0000 mg | ORAL_TABLET | Freq: Every day | ORAL | Status: DC

## 2012-03-08 ENCOUNTER — Telehealth: Payer: Self-pay | Admitting: *Deleted

## 2012-03-08 NOTE — Telephone Encounter (Signed)
Left message to call office to see if Pt has tried Prilosec OTC,prevacid 24 hour, zegerid OTC or any OTC med for this condition. If so PA can be started if not must have tried and fail in order to approval current Rx.

## 2012-03-10 NOTE — Telephone Encounter (Signed)
Pt states has not tried any other meds.per insurance Pt must have tried and the OTC meds. Please advise

## 2012-03-10 NOTE — Telephone Encounter (Signed)
Please let pt know her insurance requires her to try OTC med first

## 2012-03-11 NOTE — Telephone Encounter (Signed)
Spoke to pt to advise results/instructions. Pt understood. Pt will try OTC, if they do not work pt will call back to let us know

## 2012-03-15 NOTE — Assessment & Plan Note (Signed)
Check labs.  Replete prn. 

## 2012-03-15 NOTE — Assessment & Plan Note (Signed)
New.  Pt's sxs consistent w/ GERD.  Start PPI.  Reviewed supportive care and red flags that should prompt return.  Pt expressed understanding and is in agreement w/ plan.

## 2012-03-15 NOTE — Assessment & Plan Note (Signed)
Pt's PE WNL.  Check labs.  Anticipatory guidance provided.  

## 2012-03-22 ENCOUNTER — Encounter: Payer: Self-pay | Admitting: *Deleted

## 2013-08-10 LAB — HM PAP SMEAR: HM Pap smear: NORMAL

## 2014-01-18 ENCOUNTER — Telehealth: Payer: Self-pay

## 2014-01-18 NOTE — Telephone Encounter (Signed)
Medication and allergies:  Reviewed and updated  90 day supply/mail order: n/a Local pharmacy:  [id number] - [place], [place]   Immunizations due:  Tetanus-DUE   A/P: Personal, family history and past surgical hx:  Reviewed and updated PAP- 06/26/11- normal Flu- did not receive Tdap- patient was unsure of last vaccination; feels that it has been greater than 10 years   To Discuss with Provider: She would like any labs drawn to be sent to [facility] to be processed.

## 2014-01-22 ENCOUNTER — Ambulatory Visit (INDEPENDENT_AMBULATORY_CARE_PROVIDER_SITE_OTHER): Payer: 59 | Admitting: Family Medicine

## 2014-01-22 ENCOUNTER — Encounter: Payer: Self-pay | Admitting: Family Medicine

## 2014-01-22 VITALS — BP 124/76 | HR 74 | Temp 98.2°F | Resp 16 | Ht 63.0 in | Wt 178.1 lb

## 2014-01-22 DIAGNOSIS — Z23 Encounter for immunization: Secondary | ICD-10-CM

## 2014-01-22 DIAGNOSIS — Z0000 Encounter for general adult medical examination without abnormal findings: Secondary | ICD-10-CM

## 2014-01-22 NOTE — Assessment & Plan Note (Signed)
Pt's PE WNL.  UTD on GYN.  Tdap updated today.  Check labs.  Anticipatory guidance provided.

## 2014-01-22 NOTE — Patient Instructions (Signed)
Follow up in 6 months to recheck cholesterol We'll notify you of your lab results and make any changes if needed Keep up the good work on healthy diet and regular exercise Call with any questions or concerns Hang in there!!!

## 2014-01-22 NOTE — Progress Notes (Signed)
Pre visit review using our clinic review tool, if applicable. No additional management support is needed unless otherwise documented below in the visit note. 

## 2014-01-22 NOTE — Addendum Note (Signed)
Addended by: Modena Morrow D on: 01/22/2014 11:08 AM   Modules accepted: Orders

## 2014-01-22 NOTE — Progress Notes (Signed)
   Subjective:    Patient ID: Rose Bell, female    DOB: Jan 30, 1981, 33 y.o.   MRN: [id number]  HPI CPE- UTD w/ GYN ([facility])   Review of Systems Patient reports no vision/ hearing changes, adenopathy,fever, weight change,  persistant/recurrent hoarseness , swallowing issues, chest pain, palpitations, edema, persistant/recurrent cough, hemoptysis, dyspnea (rest/exertional/paroxysmal nocturnal), gastrointestinal bleeding (melena, rectal bleeding), abdominal pain, significant heartburn, bowel changes, GU symptoms (dysuria, hematuria, incontinence), Gyn symptoms (abnormal  bleeding, pain),  syncope, focal weakness, memory loss, numbness & tingling, skin/hair/nail changes, abnormal bruising or bleeding, anxiety, or depression.     Objective:   Physical Exam General Appearance:    Alert, cooperative, no distress, appears stated age  Head:    Normocephalic, without obvious abnormality, atraumatic  Eyes:    PERRL, conjunctiva/corneas clear, EOM's intact, fundi    benign, both eyes  Ears:    Normal TM's and external ear canals, both ears  Nose:   Nares normal, septum midline, mucosa normal, no drainage    or sinus tenderness  Throat:   Lips, mucosa, and tongue normal; teeth and gums normal  Neck:   Supple, symmetrical, trachea midline, no adenopathy;    Thyroid: no enlargement/tenderness/nodules  Back:     Symmetric, no curvature, ROM normal, no CVA tenderness  Lungs:     Clear to auscultation bilaterally, respirations unlabored  Chest Wall:    No tenderness or deformity   Heart:    Regular rate and rhythm, S1 and S2 normal, no murmur, rub   or gallop  Breast Exam:    Deferred to GYN  Abdomen:     Soft, non-tender, bowel sounds active all four quadrants,    no masses, no organomegaly  Genitalia:    Deferred to GYN  Rectal:    Extremities:   Extremities normal, atraumatic, no cyanosis or edema  Pulses:   2+ and symmetric all extremities  Skin:   Skin color, texture, turgor normal, no  rashes or lesions  Lymph nodes:   Cervical, supraclavicular, and axillary nodes normal  Neurologic:   CNII-XII intact, normal strength, sensation and reflexes    throughout          Assessment & Plan:

## 2014-01-25 ENCOUNTER — Encounter: Payer: Self-pay | Admitting: General Practice

## 2014-01-25 ENCOUNTER — Other Ambulatory Visit: Payer: Self-pay | Admitting: Family Medicine

## 2014-01-25 DIAGNOSIS — E785 Hyperlipidemia, unspecified: Secondary | ICD-10-CM

## 2014-01-25 MED ORDER — ATORVASTATIN CALCIUM 40 MG PO TABS: 40.0000 mg | ORAL_TABLET | Freq: Every day | ORAL | Status: DC

## 2014-02-16 ENCOUNTER — Encounter: Payer: Self-pay | Admitting: Family Medicine

## 2014-03-02 ENCOUNTER — Telehealth: Payer: Self-pay | Admitting: Family Medicine

## 2014-03-02 NOTE — Telephone Encounter (Signed)
Caller name: Cheyrl Relation to pt: Call back number: [phone]   Reason for call:  Pt is having symptoms that they want to speak with nurse about.  Would not give anymore than that.

## 2014-03-02 NOTE — Telephone Encounter (Signed)
C/o:  "abscess" located on patient's left outer vaginal lip.  Area described as reddened and slightly swollen ("like a little ball underneath").  Painful. No drainage.  No fever.    Onset:  3-4 days ago  States she has never had an abscess in the area before.    Self-treatment: warm compresses; swelling has decreased some.    Advice given:  Appointment scheduled for Sat. Clinic on 03/03/14 @ 0930.

## 2014-03-03 ENCOUNTER — Encounter: Payer: Self-pay | Admitting: Family Medicine

## 2014-03-03 ENCOUNTER — Ambulatory Visit (INDEPENDENT_AMBULATORY_CARE_PROVIDER_SITE_OTHER): Payer: 59 | Admitting: Family Medicine

## 2014-03-03 VITALS — BP 120/80 | HR 63 | Temp 98.0°F | Wt 173.0 lb

## 2014-03-03 DIAGNOSIS — N9089 Other specified noninflammatory disorders of vulva and perineum: Secondary | ICD-10-CM

## 2014-03-03 MED ORDER — MUPIROCIN CALCIUM 2 % EX CREA: 1.0000 "application " | TOPICAL_CREAM | Freq: Two times a day (BID) | CUTANEOUS | Status: DC

## 2014-03-03 NOTE — Progress Notes (Signed)
   BP 120/80  Pulse 63  Temp(Src) 98 F (36.7 C) (Oral)  Wt 173 lb (78.472 kg)  SpO2 98%   CC: groin skin infection?  Subjective:    Patient ID: Rose Bell, female    DOB: 03/13/1981, 33 y.o.   MRN: [id number]  HPI: Rose Bell is a 33 y.o. female presenting on 03/03/2014 for Recurrent Skin Infections   1 wk h/o small knot in groin that started larger, now has gotten smaller.  Would like this evaluated.  Describes lesion in vulvar area.  Initially some uncomfortable but actually improved, may have drained because she did see some blood   Has been treating with warm compresses and warm shower. No h/o diabetes. First time this has happened.  Relevant past medical, surgical, family and social history reviewed and updated as indicated.  Allergies and medications reviewed and updated. Current Outpatient Prescriptions on File Prior to Visit  Medication Sig  . atorvastatin (LIPITOR) 40 MG tablet Take 1 tablet (40 mg total) by mouth daily.   No current facility-administered medications on file prior to visit.    Review of Systems Per HPI unless specifically indicated above    Objective:    BP 120/80  Pulse 63  Temp(Src) 98 F (36.7 C) (Oral)  Wt 173 lb (78.472 kg)  SpO2 98%  Physical Exam  Nursing note and vitals reviewed. Constitutional: She appears well-developed and well-nourished. No distress.  Genitourinary:     Left outer vulva with open wound with underlying indurated skin without surrounding erythema or fluctuance.  + mild induration.  Bartholin gland opening not erythematous or swollen, does not connect to indurated area on outside.       Assessment & Plan:   Problem List Items Addressed This Visit   Vulvar lesion - Primary     Anticipate resolving small boil from ingrown hair, rec no shaving but rather trimming. Nothing to drain today. Treat with mupirocin cream and continued warm compresses. Doubt bartholin gland cyst/abscess. Questions answered.   Update if sxs recurring.        Follow up plan: No Follow-up on file.

## 2014-03-03 NOTE — Patient Instructions (Signed)
I think there was ingrown hair that is resolving. Continue warm compresses and continue warm soaks. May use mupirocin antibiotic cream twice daily for 3-5 days. Update [place] if symptoms persist or recur.

## 2014-03-03 NOTE — Progress Notes (Signed)
Pre visit review using our clinic review tool, if applicable. No additional management support is needed unless otherwise documented below in the visit note. 

## 2014-03-03 NOTE — Assessment & Plan Note (Signed)
Anticipate resolving small boil from ingrown hair, rec no shaving but rather trimming. Nothing to drain today. Treat with mupirocin cream and continued warm compresses. Doubt bartholin gland cyst/abscess. Questions answered.  Update if sxs recurring.

## 2014-04-11 ENCOUNTER — Telehealth: Payer: Self-pay | Admitting: *Deleted

## 2014-04-11 NOTE — Telephone Encounter (Signed)
Noted will wait for a call back if pt changes her mind.

## 2014-04-11 NOTE — Telephone Encounter (Signed)
Caller name:  Johann Relation to pt:  self Call back number: [phone]  Pharmacy:  Reason for call:   Right hand pointer finger at top knuckle/joint she has been having burning sensation for aprox 2 days.  No other symptoms, but becoming more frequent.  I consulted with Ashlee, RN and she advised if no other symptoms, she just needed appt to have it checked.  Pt wants to wait and see how it does and will call back if she feels she needs appt.

## 2014-07-24 ENCOUNTER — Ambulatory Visit: Payer: 59 | Admitting: Family Medicine

## 2014-08-10 ENCOUNTER — Encounter: Payer: Self-pay | Admitting: Family Medicine

## 2014-08-10 ENCOUNTER — Ambulatory Visit (INDEPENDENT_AMBULATORY_CARE_PROVIDER_SITE_OTHER): Payer: 59 | Admitting: Family Medicine

## 2014-08-10 VITALS — BP 120/82 | HR 71 | Temp 98.2°F | Resp 16 | Wt 177.5 lb

## 2014-08-10 DIAGNOSIS — E785 Hyperlipidemia, unspecified: Secondary | ICD-10-CM

## 2014-08-10 LAB — HEPATIC FUNCTION PANEL
ALT: 19 U/L (ref 7–35)
AST: 17 U/L (ref 13–35)
Alkaline Phosphatase: 76 U/L (ref 25–125)
Bilirubin, Direct: 0.13 mg/dL
Bilirubin, Total: 0.5 mg/dL

## 2014-08-10 LAB — LIPID PANEL
Cholesterol: 192 mg/dL (ref 0–200)
HDL: 54 mg/dL (ref 35–70)
LDL Cholesterol: 118 mg/dL
LDl/HDL Ratio: 3.6
Triglycerides: 99 mg/dL (ref 40–160)

## 2014-08-10 LAB — BASIC METABOLIC PANEL
BUN: 7 mg/dL (ref 4–21)
Creatinine: 0.7 mg/dL (ref <=1.1)
Glucose: 90 mg/dL
Potassium: 4 mmol/L (ref 3.4–5.3)
Sodium: 141 mmol/L (ref 137–147)

## 2014-08-10 NOTE — Patient Instructions (Signed)
Schedule your complete physical in 6 months Keep up the good work on healthy diet and regular exercise Monitor your periods for the next few cycles and if they remain irregular, call me! Call with any questions or concerns Happy Fall!!!

## 2014-08-10 NOTE — Progress Notes (Signed)
Pre visit review using our clinic review tool, if applicable. No additional management support is needed unless otherwise documented below in the visit note. 

## 2014-08-10 NOTE — Progress Notes (Signed)
   Subjective:    Patient ID: Rose Bell, female    DOB: [birth year], 33 y.o.   MRN: [id number]  HPI Hyperlipidemia- chronic problem, on Lipitor 40mg  daily.  Denies abd pain, N/V, myalgias.  Has been attempting to work on healthy diet and regular exercise.   Review of Systems For ROS see HPI     Objective:   Physical Exam  Vitals reviewed. Constitutional: She is oriented to person, place, and time. She appears well-developed and well-nourished. No distress.  HENT:  Head: Normocephalic and atraumatic.  Eyes: Conjunctivae and EOM are normal. Pupils are equal, round, and reactive to light.  Neck: Normal range of motion. Neck supple. No thyromegaly present.  Cardiovascular: Normal rate, regular rhythm, normal heart sounds and intact distal pulses.   No murmur heard. Pulmonary/Chest: Effort normal and breath sounds normal. No respiratory distress.  Abdominal: Soft. She exhibits no distension. There is no tenderness.  Musculoskeletal: She exhibits no edema.  Lymphadenopathy:    She has no cervical adenopathy.  Neurological: She is alert and oriented to person, place, and time.  Skin: Skin is warm and dry.  Psychiatric: She has a normal mood and affect. Her behavior is normal.          Assessment & Plan:

## 2014-08-12 NOTE — Assessment & Plan Note (Signed)
Chronic problem.  Pt now taking statin regularly.  Attempting healthy diet and regular exercise.  Check labs.  Adjust meds prn

## 2014-08-15 ENCOUNTER — Encounter: Payer: Self-pay | Admitting: General Practice

## 2014-08-21 ENCOUNTER — Encounter: Payer: Self-pay | Admitting: Family Medicine

## 2014-08-22 ENCOUNTER — Ambulatory Visit (INDEPENDENT_AMBULATORY_CARE_PROVIDER_SITE_OTHER): Payer: 59 | Admitting: Family Medicine

## 2014-08-22 VITALS — BP 137/84 | HR 86 | Temp 98.3°F | Resp 16 | Wt 181.2 lb

## 2014-08-22 DIAGNOSIS — N9089 Other specified noninflammatory disorders of vulva and perineum: Secondary | ICD-10-CM

## 2014-08-22 MED ORDER — DOXYCYCLINE HYCLATE 100 MG PO TABS: 100.0000 mg | ORAL_TABLET | Freq: Two times a day (BID) | ORAL | Status: DC

## 2014-08-22 NOTE — Patient Instructions (Signed)
Follow up as needed Remind the lab to send it to [place]- I ordered it but a reminder never hurts! Start the Doxy twice daily as directed- take w/ food Hot compresses or warm soaks will help Call with any questions or concerns Hang in there!!!

## 2014-08-22 NOTE — Progress Notes (Signed)
   Subjective:    Patient ID: Rose Bell, female    DOB: 01/07/1981, 33 y.o.   MRN: [id number]  HPI Bump- 'like a pimple'.  Started this week, mildly irritated.  On inner labia, R side.  No drainage.  Hx of similar on L that 'popped' and scarred.  No new sexual contacts.  No hx of HSV.   Review of Systems For ROS see HPI      Objective:   Physical Exam  Constitutional: She appears well-developed and well-nourished. No distress.  Genitourinary:  Pt w/ <0.5 cm erythematous, TTP, R labial inclusion cyst w/ apparent infection.  Area prepped w/ alcohol, lanced w/ 25 gauge needle and firm, pearly core expressed.  Vitals reviewed.         Assessment & Plan:

## 2014-08-22 NOTE — Assessment & Plan Note (Signed)
New.  Area consistent w/ inclusion cyst.  Due to erythema and TTP will start doxy in case of mild infxn.  Area was lanced and hard pearly center expressed.  Pt tolerated w/o difficulty.  Area not consistent w/ HSV but will get labs to r/o and provide reassurance for pt.

## 2014-08-22 NOTE — Progress Notes (Signed)
Pre visit review using our clinic review tool, if applicable. No additional management support is needed unless otherwise documented below in the visit note. 

## 2014-08-23 NOTE — Addendum Note (Signed)
Addended by: Modena Morrow D on: 08/23/2014 09:48 AM   Modules accepted: Orders

## 2014-08-23 NOTE — Addendum Note (Signed)
Addended by: Modena Morrow D on: 08/23/2014 09:45 AM   Modules accepted: Orders

## 2014-08-27 ENCOUNTER — Telehealth: Payer: Self-pay | Admitting: General Practice

## 2014-08-27 NOTE — Telephone Encounter (Signed)
Pt notified that herpes results were negative. Original sent to scanning.

## 2014-11-12 ENCOUNTER — Telehealth: Payer: Self-pay | Admitting: Family Medicine

## 2014-11-12 MED ORDER — ATORVASTATIN CALCIUM 40 MG PO TABS: 40.0000 mg | ORAL_TABLET | Freq: Every day | ORAL | Status: DC

## 2014-11-12 NOTE — Telephone Encounter (Signed)
Caller name: Joana Relation to pt: self Call back number:  [phone] Pharmacy:   Reason for call:   Insurance has deemed that cholesterol medication (does not remember name) a maintenance medication and requires that all refills be a 90 day supply. Patient does not know name of mail order pharmacy. Will be calling me back with this information.

## 2014-11-12 NOTE — Telephone Encounter (Signed)
Caller name:Jillann  Relationship to patient:Self Can be reached:[phone] Pharmacy: [facility] [phone]  Reason for call: PT calling to update with pharmacy info

## 2014-11-12 NOTE — Telephone Encounter (Signed)
Med filled.  

## 2015-01-02 ENCOUNTER — Encounter: Payer: Self-pay | Admitting: Family Medicine

## 2015-01-02 ENCOUNTER — Telehealth: Payer: Self-pay | Admitting: Family Medicine

## 2015-01-02 ENCOUNTER — Ambulatory Visit (INDEPENDENT_AMBULATORY_CARE_PROVIDER_SITE_OTHER): Payer: 59 | Admitting: Family Medicine

## 2015-01-02 VITALS — BP 118/78 | HR 85 | Temp 98.2°F | Resp 16 | Wt 185.2 lb

## 2015-01-02 DIAGNOSIS — R221 Localized swelling, mass and lump, neck: Secondary | ICD-10-CM | POA: Diagnosis not present

## 2015-01-02 DIAGNOSIS — E669 Obesity, unspecified: Secondary | ICD-10-CM | POA: Diagnosis not present

## 2015-01-02 DIAGNOSIS — E785 Hyperlipidemia, unspecified: Secondary | ICD-10-CM

## 2015-01-02 MED ORDER — PHENTERMINE HCL 37.5 MG PO CAPS: 37.5000 mg | ORAL_CAPSULE | ORAL | Status: DC

## 2015-01-02 NOTE — Patient Instructions (Signed)
Follow up in 4-6 weeks to recheck BP, HR, and weight loss progress Schedule your complete physical in 3-4 months Start the Phentermine daily to assist w/ weight loss Continue to make healthy food choices and get regular exercise Start Claritin or Zyrtec daily Drink plenty of fluids!! You can stop the Lipitor and start Red Yeast Rice to improve cholesterol Call with any questions or concerns Hang in there!

## 2015-01-02 NOTE — Telephone Encounter (Signed)
Ok to switch 

## 2015-01-02 NOTE — Telephone Encounter (Signed)
Caller name:Sylvia Relation to VV:ZSMOLMBE Call back number:[phone] Pharmacy:[facility]  Reason for call: pharmacy would like to know if the rx  phentermine 37.5 MG capsule can be switched to tablets verses capsule, states there is a $15 difference in the price and pt would perfer tabs.

## 2015-01-02 NOTE — Progress Notes (Signed)
   Subjective:    Patient ID: Rose Bell, female    DOB: 1981/03/04, 34 y.o.   MRN: [id number]  HPI Lump in throat- sxs started last week.  Pt has sensation of fullness and something being 'stuck' when swallowing, eating.  Unable to cough it up.  + PND.  No fevers.  No recent choking or episode of painful swallowing.  Pt has family hx of thyroid disease and this concerns here.  Obesity-pt reports weight continues to increase despite exercising and attempting to make healthy food choices.  Pt wants to lose weight in attempts to get off cholesterol.   Review of Systems For ROS see HPI     Objective:   Physical Exam  Constitutional: She appears well-developed and well-nourished. No distress.  HENT:  Head: Normocephalic and atraumatic.  Right Ear: Tympanic membrane normal.  Left Ear: Tympanic membrane normal.  Nose: Mucosal edema and rhinorrhea present. Right sinus exhibits no maxillary sinus tenderness and no frontal sinus tenderness. Left sinus exhibits no maxillary sinus tenderness and no frontal sinus tenderness.  Mouth/Throat: Mucous membranes are normal. Posterior oropharyngeal erythema (w/ PND) present.  Eyes: Conjunctivae and EOM are normal. Pupils are equal, round, and reactive to light.  Neck: Normal range of motion. Neck supple.  Cardiovascular: Normal rate, regular rhythm and normal heart sounds.   Pulmonary/Chest: Effort normal and breath sounds normal. No respiratory distress. She has no wheezes. She has no rales.  Lymphadenopathy:    She has no cervical adenopathy.  Vitals reviewed.         Assessment & Plan:

## 2015-01-02 NOTE — Progress Notes (Signed)
Pre visit review using our clinic review tool, if applicable. No additional management support is needed unless otherwise documented below in the visit note. 

## 2015-01-02 NOTE — Telephone Encounter (Signed)
Medication phoned in and switched to the tablet form. Chart updated to reflect change.

## 2015-01-15 NOTE — Assessment & Plan Note (Signed)
Ongoing struggle for pt.  She has recently gained weight w/ the holiday season.  Pt reports she is working on [employer] and plans to resume her exercise.  Feels she is 'stuck'.  Based on this, will start phentermine and monitor for weight loss.  Cautioned pt about possible anxiety, insomnia, tachycardia, elevated BP.  Pt expressed understanding and is in agreement w/ plan.

## 2015-01-15 NOTE — Assessment & Plan Note (Signed)
Chronic problem.  Pt would like to stop statin and see if she can control her #s w/ healthy diet, regular exercise and red yeast rice.  Pt to attempt this x3-6 months and at that time, we will re-evaluate and determine if statin is required.  Pt expressed understanding and is in agreement w/ plan.

## 2015-01-15 NOTE — Assessment & Plan Note (Signed)
New.  Based on pt's PE and copious PND, suspect this is the cause of her sxs.  Discussed referral to ENT but pt prefers to hold at this time.  Will start OTC antihistamine daily and monitor for symptom improvement.  Pt expressed understanding and is in agreement w/ plan.

## 2015-02-13 ENCOUNTER — Emergency Department (HOSPITAL_COMMUNITY)
Admission: EM | Admit: 2015-02-13 | Discharge: 2015-02-13 | Disposition: A | Discharge status: Home / Self Care | Payer: 59 | Attending: Emergency Medicine | Admitting: Emergency Medicine

## 2015-02-13 ENCOUNTER — Encounter (HOSPITAL_COMMUNITY): Payer: Self-pay | Admitting: *Deleted

## 2015-02-13 ENCOUNTER — Telehealth: Payer: Self-pay | Admitting: Family Medicine

## 2015-02-13 ENCOUNTER — Emergency Department (HOSPITAL_COMMUNITY): Payer: 59

## 2015-02-13 DIAGNOSIS — Z3202 Encounter for pregnancy test, result negative: Secondary | ICD-10-CM | POA: Insufficient documentation

## 2015-02-13 DIAGNOSIS — E785 Hyperlipidemia, unspecified: Secondary | ICD-10-CM | POA: Diagnosis not present

## 2015-02-13 DIAGNOSIS — R1011 Right upper quadrant pain: Secondary | ICD-10-CM

## 2015-02-13 DIAGNOSIS — Z79899 Other long term (current) drug therapy: Secondary | ICD-10-CM | POA: Diagnosis not present

## 2015-02-13 LAB — COMPREHENSIVE METABOLIC PANEL
ALT: 17 U/L (ref 0–35)
AST: 20 U/L (ref 0–37)
Albumin: 4.2 g/dL (ref 3.5–5.2)
Alkaline Phosphatase: 72 U/L (ref 39–117)
Anion gap: 10 (ref 5–15)
BUN: 9 mg/dL (ref 6–23)
CO2: 23 mmol/L (ref 19–32)
Calcium: 9.5 mg/dL (ref 8.4–10.5)
Chloride: 106 mmol/L (ref 96–112)
Creatinine, Ser: 0.87 mg/dL (ref 0.50–1.10)
GFR calc Af Amer: >90 mL/min (ref >90)
GFR calc non Af Amer: 85 mL/min — ABNORMAL LOW (ref >90)
Glucose, Bld: 103 mg/dL — ABNORMAL HIGH (ref 70–99)
Potassium: 4 mmol/L (ref 3.5–5.1)
Sodium: 139 mmol/L (ref 135–145)
Total Bilirubin: 0.6 mg/dL (ref 0.3–1.2)
Total Protein: 7.4 g/dL (ref 6.0–8.3)

## 2015-02-13 LAB — CBC WITH DIFFERENTIAL/PLATELET
Basophils Absolute: 0 10*3/uL (ref 0.0–0.1)
Basophils Relative: 0 % (ref 0–1)
Eosinophils Absolute: 0.1 10*3/uL (ref 0.0–0.7)
Eosinophils Relative: 1 % (ref 0–5)
HCT: 39.8 % (ref 36.0–46.0)
Hemoglobin: 13.1 g/dL (ref 12.0–15.0)
Lymphocytes Relative: 23 % (ref 12–46)
Lymphs Abs: 2.8 10*3/uL (ref 0.7–4.0)
MCH: 29.4 pg (ref 26.0–34.0)
MCHC: 32.9 g/dL (ref 30.0–36.0)
MCV: 89.4 fL (ref 78.0–100.0)
Monocytes Absolute: 0.7 10*3/uL (ref 0.1–1.0)
Monocytes Relative: 6 % (ref 3–12)
Neutro Abs: 8.7 10*3/uL — ABNORMAL HIGH (ref 1.7–7.7)
Neutrophils Relative %: 70 % (ref 43–77)
Platelets: 275 10*3/uL (ref 150–400)
RBC: 4.45 MIL/uL (ref 3.87–5.11)
RDW: 12.7 % (ref 11.5–15.5)
WBC: 12.4 10*3/uL — ABNORMAL HIGH (ref 4.0–10.5)

## 2015-02-13 LAB — URINALYSIS, ROUTINE W REFLEX MICROSCOPIC
Bilirubin Urine: NEGATIVE
Glucose, UA: NEGATIVE mg/dL
Hgb urine dipstick: NEGATIVE
Ketones, ur: NEGATIVE mg/dL
Leukocytes, UA: NEGATIVE
Nitrite: NEGATIVE
Protein, ur: NEGATIVE mg/dL
Specific Gravity, Urine: 1.017 (ref 1.005–1.030)
Urobilinogen, UA: 0.2 mg/dL (ref 0.0–1.0)
pH: 5 (ref 5.0–8.0)

## 2015-02-13 LAB — I-STAT TROPONIN, ED: Troponin i, poc: 0 ng/mL (ref 0.00–0.08)

## 2015-02-13 LAB — LIPASE, BLOOD: Lipase: 31 U/L (ref 11–59)

## 2015-02-13 LAB — POC URINE PREG, ED: Preg Test, Ur: NEGATIVE

## 2015-02-13 MED ORDER — HYDROCODONE-ACETAMINOPHEN 5-325 MG PO TABS: 2.0000 | ORAL_TABLET | ORAL | Status: DC | PRN

## 2015-02-13 MED ORDER — SODIUM CHLORIDE 0.9 % IV BOLUS (SEPSIS)
1000.0000 mL | Freq: Once | INTRAVENOUS | Status: AC
  Administered 2015-02-13: 1000 mL via INTRAVENOUS

## 2015-02-13 MED ORDER — MORPHINE SULFATE 4 MG/ML IJ SOLN
4.0000 mg | Freq: Once | INTRAMUSCULAR | Status: AC
  Administered 2015-02-13: 4 mg via INTRAVENOUS
  Filled 2015-02-13: qty 1

## 2015-02-13 MED ORDER — ONDANSETRON 4 MG PO TBDP: 4.0000 mg | ORAL_TABLET | Freq: Three times a day (TID) | ORAL | Status: DC | PRN

## 2015-02-13 MED ORDER — ONDANSETRON HCL 4 MG/2ML IJ SOLN
4.0000 mg | Freq: Once | INTRAMUSCULAR | Status: AC
  Administered 2015-02-13: 4 mg via INTRAVENOUS
  Filled 2015-02-13: qty 2

## 2015-02-13 NOTE — Telephone Encounter (Signed)
Patient arrived in [facility] ED.

## 2015-02-13 NOTE — ED Notes (Signed)
Pt c/o sharp pain to upper right abd since last night. States that the pain radiates across. Denies N/V/D.

## 2015-02-13 NOTE — Telephone Encounter (Signed)
Patient called in with sharp rib pain since last night that has worsened. Having a hard time catching her breath. Call transferred to [facility].

## 2015-02-13 NOTE — Discharge Instructions (Signed)
Take Vicodin as needed for pain. Take zofran as needed for nausea. Refer to attached documents for more information. Return to the ED with worsening or concerning symptoms.

## 2015-02-13 NOTE — Telephone Encounter (Signed)
[facility] - Client TELEPHONE ADVICE RECORD   [facility]     Patient Name: Rose Bell Initial Comment caller states sharp rib pain and short of breath  DOB: November 14, 1980      Nurse Assessment  Nurse: Venetia Maxon, RN, Manuela Schwartz Date/Time (Eastern Time): 02/13/2015 3:03:35 PM  Confirm and document reason for call. If symptomatic, describe symptoms. ---Caller states sharp rib pain and short of breath It is under the right rib area. no fever. Caller states that last night she had a sharp searing in the same area 45 min. she could not straighten up and could not breathe deeply. pain level was 10 this am and now 4/10 . She cannot take a full deep breath.   Has the patient traveled out of the country within the last 30 days? ---No   Does the patient require triage? ---Yes   Related visit to physician within the last 2 weeks? ---No   Does the PT have any chronic conditions? (i.e. diabetes, asthma, etc.) ---Yes   List chronic conditions. ---cholesterol   Did the patient indicate they were pregnant? ---No     Guidelines     Guideline Title Affirmed Question Affirmed Notes   Chest Pain Taking a deep breath makes pain worse    Final Disposition User   Go to ED Now (or PCP triage) Venetia Maxon, RN, Manuela Schwartz

## 2015-02-13 NOTE — ED Provider Notes (Signed)
CSN: [id number]     Arrival date & time 02/13/15  1720 History   First MD Initiated Contact with Patient 02/13/15 2014     No chief complaint on file.    (Consider location/radiation/quality/duration/timing/severity/associated sxs/prior Treatment) HPI Comments: Patient is a 34 year old female who presents with abdominal pain since last night. The pain is located in her RUQ and does not radiate. The pain is described as stabbing and severe. The pain started gradually and progressively worsened since the onset. No alleviating/aggravating factors. The patient has tried nothing for symptoms without relief. Associated symptoms include nothing. Patient denies fever, headache, NVD, chest pain, SOB, dysuria, constipation, abnormal vaginal bleeding/discharge. No history of abdominal surgery.      Past Medical History  Diagnosis Date  . Hyperlipemia    History reviewed. No pertinent past surgical history. Family History  Problem Relation Age of Onset  . Diabetes Mother   . Diabetes      father side of family  . Hypertension Mother   . Hypothyroidism Mother    History  Substance Use Topics  . Smoking status: Never Smoker   . Smokeless tobacco: Not on file  . Alcohol Use: Yes     Comment: socially   OB History    No data available     Review of Systems  Gastrointestinal: Positive for abdominal pain.  All other systems reviewed and are negative.     Allergies  Review of patient's allergies indicates no known allergies.  Home Medications   Prior to Admission medications   Medication Sig Start Date End Date Taking? Authorizing Provider  atorvastatin (LIPITOR) 40 MG tablet Take 1 tablet (40 mg total) by mouth daily. 11/12/14   Midge Minium, MD  Phentermine HCl 37.5 MG TBDP Take 1 tablet by mouth every morning.    Historical Provider, MD   BP 142/80 mmHg  Pulse 102  Temp(Src) 99 F (37.2 C)  Resp 20  Wt 178 lb 6 oz (80.91 kg)  SpO2 100%  LMP 02/13/2015 Physical Exam   Constitutional: She is oriented to person, place, and time. She appears well-developed and well-nourished. No distress.  HENT:  Head: Normocephalic and atraumatic.  Eyes: Conjunctivae and EOM are normal.  Neck: Normal range of motion.  Cardiovascular: Normal rate and regular rhythm.  Exam reveals no gallop and no friction rub.   No murmur heard. Pulmonary/Chest: Effort normal and breath sounds normal. She has no wheezes. She has no rales. She exhibits no tenderness.  Abdominal: Soft. She exhibits no distension. There is tenderness. There is no rebound.  RUQ tenderness to palpation. No other focal tenderness or peritoneal signs.   Musculoskeletal: Normal range of motion.  Neurological: She is alert and oriented to person, place, and time. Coordination normal.  Speech is goal-oriented. Moves limbs without ataxia.   Skin: Skin is warm and dry.  Psychiatric: She has a normal mood and affect. Her behavior is normal.  Nursing note and vitals reviewed.   ED Course  Procedures (including critical care time) Labs Review Labs Reviewed  CBC WITH DIFFERENTIAL/PLATELET - Abnormal; Notable for the following:    WBC 12.4 (*)    Neutro Abs 8.7 (*)    All other components within normal limits  COMPREHENSIVE METABOLIC PANEL - Abnormal; Notable for the following:    Glucose, Bld 103 (*)    GFR calc non Af Amer 85 (*)    All other components within normal limits  LIPASE, BLOOD  URINALYSIS, ROUTINE W REFLEX  MICROSCOPIC  Randolm Idol, ED  POC URINE PREG, ED    Imaging Review US Abdomen Complete  02/13/2015   CLINICAL DATA:  34 year old female with right upper quadrant abdominal pain  EXAM: ULTRASOUND ABDOMEN COMPLETE  COMPARISON:  None.  FINDINGS: Gallbladder: No gallstones or wall thickening visualized. No sonographic Murphy sign noted.  Common bile duct: Diameter: Within normal limits at 4 mm  Liver: No focal lesion identified. Within normal limits in parenchymal echogenicity. Main portal  vein patent with normal hepatopetal flow.  IVC: No abnormality visualized.  Pancreas: Visualized portion unremarkable.  Spleen: Size and appearance within normal limits.  Right Kidney: Length: 10.9 cm. Echogenicity within normal limits. No mass or hydronephrosis visualized.  Left Kidney: Length: 11.9 cm. Echogenicity within normal limits. No mass or hydronephrosis visualized.  Abdominal aorta: No aneurysm visualized.  Other findings: None.  IMPRESSION: Unremarkable abdominal ultrasound.   Electronically Signed   By: Jacqulynn Cadet M.D.   On: 02/13/2015 23:05     EKG Interpretation None      MDM   Final diagnoses:  RUQ abdominal pain   8:32 PM Patient's labs show elevated WBC at 12.4 with remaining labs stable. Patient mildly tachycardic at 102 likely due to pain. Patient will have morphine and zofran.   11:19 PM Patient reports improvement of pain. US shows no acute changes. Patient will be discharged with Vicodin and zofran and instructed to follow up with PCP. Patient instructed to return with worsening or concerning symptoms.     Alvina Chou, PA-C 02/13/15 2321  Merryl Hacker, MD 02/15/15 2007

## 2015-02-18 ENCOUNTER — Encounter: Payer: Self-pay | Admitting: Family Medicine

## 2015-02-18 ENCOUNTER — Ambulatory Visit (INDEPENDENT_AMBULATORY_CARE_PROVIDER_SITE_OTHER): Payer: 59 | Admitting: Family Medicine

## 2015-02-18 VITALS — BP 120/80 | HR 95 | Temp 98.6°F | Resp 16 | Wt 182.2 lb

## 2015-02-18 DIAGNOSIS — K5901 Slow transit constipation: Secondary | ICD-10-CM

## 2015-02-18 DIAGNOSIS — F411 Generalized anxiety disorder: Secondary | ICD-10-CM

## 2015-02-18 DIAGNOSIS — R1011 Right upper quadrant pain: Secondary | ICD-10-CM | POA: Diagnosis not present

## 2015-02-18 LAB — CBC AND DIFFERENTIAL
HCT: 38 % (ref 36–46)
Hemoglobin: 12.7 g/dL (ref 12.0–16.0)
Neutrophils Absolute: 3 /uL
Platelets: 307 10*3/uL (ref 150–399)
WBC: 5.5 10^3/mL

## 2015-02-18 MED ORDER — POLYETHYLENE GLYCOL 3350 17 GM/SCOOP PO POWD: 17.0000 g | Freq: Every day | ORAL | Status: DC

## 2015-02-18 MED ORDER — BUPROPION HCL ER (XL) 150 MG PO TB24: 150.0000 mg | ORAL_TABLET | Freq: Every day | ORAL | Status: DC

## 2015-02-18 NOTE — Progress Notes (Signed)
Pre visit review using our clinic review tool, if applicable. No additional management support is needed unless otherwise documented below in the visit note. 

## 2015-02-18 NOTE — Assessment & Plan Note (Signed)
Deteriorated.  Pt is very stressed w/ her inability to lose weight, her recent promotion, and home situation.  Based on this, will attempt to start Wellbutrin to aide in her anxiety and also her weight loss struggles.  Cautioned on possible side effects.  Will follow closely.

## 2015-02-18 NOTE — Assessment & Plan Note (Signed)
New to provider, ongoing for pt.  Start Miralax daily.  Reviewed need for increased water, fiber, and regular exercise.  Pt expressed understanding and is in agreement w/ plan.

## 2015-02-18 NOTE — Patient Instructions (Signed)
Follow up in 4 weeks to recheck mood We'll notify you of your lab results and make any changes if needed Start the Wellbutrin daily to help w/ anxiety and weight Continue the phentermine daily Start the Miralax daily until regular BMs and then as needed Drink plenty of fluids Get exercise as you are able- it will help w/ weight loss but also w/ stress outlet Call with any questions or concerns Hang in there!

## 2015-02-18 NOTE — Progress Notes (Signed)
   Subjective:    Patient ID: Rose Bell, female    DOB: Oct 21, 1980, 34 y.o.   MRN: [id number]  HPI ER f/u- pt went to ER on 4/27 for RUQ pain.  Pt reports pain woke her from sleep, 'like someone had stabbed me w/ a hot iron'.  Pain lasted for 1 hr and then resolved.  Pain worsened after lunch and was then constant.  Went to ER after talking to RN.  Had normal [place] but elevated WBC.  Had morphine in ER.  Has not had abdominal pain since ER.  Pt has hx of constipation.  Is very upset about weight- 'i have struggled my whole life and I don't want to be this way'.  Got a promotion at work and is not able to work out due to schedule and stress.   Review of Systems For ROS see HPI     Objective:   Physical Exam  Constitutional: She is oriented to person, place, and time. She appears well-developed and well-nourished. No distress.  HENT:  Head: Normocephalic and atraumatic.  Eyes: Conjunctivae and EOM are normal. Pupils are equal, round, and reactive to light.  Neck: Normal range of motion. Neck supple. No thyromegaly present.  Cardiovascular: Normal rate, regular rhythm, normal heart sounds and intact distal pulses.   No murmur heard. Pulmonary/Chest: Effort normal and breath sounds normal. No respiratory distress.  Abdominal: Soft. She exhibits no distension. There is no tenderness. There is no rebound and no guarding.  Musculoskeletal: She exhibits no edema.  Lymphadenopathy:    She has no cervical adenopathy.  Neurological: She is alert and oriented to person, place, and time.  Skin: Skin is warm and dry.  Psychiatric: Her behavior is normal.  Tearful, anxious  Vitals reviewed.         Assessment & Plan:

## 2015-02-18 NOTE — Assessment & Plan Note (Signed)
Resolved since pt's recent ER visit.  sxs and relief w/ morphine sound consistent w/ gallstone that may have passed prior to [place].  CBC was elevated at time of ER visit, will repeat today.  Reviewed supportive care and red flags that should prompt return.  Pt expressed understanding and is in agreement w/ plan.

## 2015-03-04 ENCOUNTER — Encounter: Payer: Self-pay | Admitting: General Practice

## 2015-03-11 ENCOUNTER — Encounter: Payer: Self-pay | Admitting: Family Medicine

## 2015-04-17 ENCOUNTER — Telehealth: Payer: Self-pay | Admitting: Family Medicine

## 2015-04-17 NOTE — Telephone Encounter (Signed)
Pre visit letter mailed 04/15/15

## 2015-05-03 ENCOUNTER — Telehealth: Payer: Self-pay | Admitting: *Deleted

## 2015-05-03 NOTE — Telephone Encounter (Signed)
Unable to reach patient at time of Pre-Visit Call.  Left message for patient to return call when available.    

## 2015-05-06 ENCOUNTER — Encounter: Payer: Self-pay | Admitting: Family Medicine

## 2015-05-06 ENCOUNTER — Ambulatory Visit (INDEPENDENT_AMBULATORY_CARE_PROVIDER_SITE_OTHER): Payer: 59 | Admitting: Family Medicine

## 2015-05-06 VITALS — BP 120/80 | HR 73 | Temp 98.2°F | Resp 16 | Ht 63.0 in | Wt 181.0 lb

## 2015-05-06 DIAGNOSIS — Z0000 Encounter for general adult medical examination without abnormal findings: Secondary | ICD-10-CM

## 2015-05-06 LAB — CBC AND DIFFERENTIAL
HCT: 39 % (ref 36–46)
Hemoglobin: 12.3 g/dL (ref 12.0–16.0)
Neutrophils Absolute: 52 /uL
Platelets: 324 10*3/uL (ref 150–399)
WBC: 6.8 10^3/mL

## 2015-05-06 LAB — BASIC METABOLIC PANEL
BUN: 8 mg/dL (ref 4–21)
Creatinine: 0.7 mg/dL (ref <=1.1)
Glucose: 88 mg/dL
Potassium: 3.6 mmol/L (ref 3.4–5.3)
Sodium: 141 mmol/L (ref 137–147)

## 2015-05-06 LAB — HEPATIC FUNCTION PANEL
ALT: 10 U/L (ref 7–35)
AST: 14 U/L (ref 13–35)
Alkaline Phosphatase: 71 U/L (ref 25–125)
Bilirubin, Direct: 0.09 mg/dL
Bilirubin, Total: 0.4 mg/dL

## 2015-05-06 LAB — LIPID PANEL
Cholesterol: 268 mg/dL — AB (ref 0–200)
HDL: 46 mg/dL (ref 35–70)
LDL Cholesterol: 193 mg/dL
LDl/HDL Ratio: 5.8
Triglycerides: 147 mg/dL (ref 40–160)

## 2015-05-06 LAB — TSH: TSH: 1.47 u[IU]/mL (ref <=5.90)

## 2015-05-06 NOTE — Assessment & Plan Note (Signed)
Pt's PE WNL w/ exception of obesity.  Check labs.  Applauded her recent efforts at healthy diet and regular exercise.  Anticipatory guidance provided.

## 2015-05-06 NOTE — Patient Instructions (Signed)
Follow up in 6 months to recheck cholesterol We'll notify you of your lab results and make any changes if needed Keep up the good work on healthy diet and regular exercise Call with any questions or concerns Happy [birth year] Birthday!!!

## 2015-05-06 NOTE — Progress Notes (Signed)
Pre visit review using our clinic review tool, if applicable. No additional management support is needed unless otherwise documented below in the visit note. 

## 2015-05-06 NOTE — Progress Notes (Signed)
   Subjective:    Patient ID: Rose Bell, female    DOB: [birth year], 34 y.o.   MRN: [id number]  HPI CPE- UTD on pap, due next year.  Exercising regularly.  Very upset that she hasn't lost weight.   Review of Systems Patient reports no vision/ hearing changes, adenopathy,fever, weight change,  persistant/recurrent hoarseness , swallowing issues, chest pain, palpitations, edema, persistant/recurrent cough, hemoptysis, dyspnea (rest/exertional/paroxysmal nocturnal), gastrointestinal bleeding (melena, rectal bleeding), abdominal pain, significant heartburn, bowel changes, GU symptoms (dysuria, hematuria, incontinence), Gyn symptoms (abnormal  bleeding, pain),  syncope, focal weakness, memory loss, numbness & tingling, skin/hair/nail changes, abnormal bruising or bleeding, anxiety, or depression.     Objective:   Physical Exam  General Appearance:    Alert, cooperative, no distress, appears stated age  Head:    Normocephalic, without obvious abnormality, atraumatic  Eyes:    PERRL, conjunctiva/corneas clear, EOM's intact, fundi    benign, both eyes  Ears:    Normal TM's and external ear canals, both ears  Nose:   Nares normal, septum midline, mucosa normal, no drainage    or sinus tenderness  Throat:   Lips, mucosa, and tongue normal; teeth and gums normal  Neck:   Supple, symmetrical, trachea midline, no adenopathy;    Thyroid: no enlargement/tenderness/nodules  Back:     Symmetric, no curvature, ROM normal, no CVA tenderness  Lungs:     Clear to auscultation bilaterally, respirations unlabored  Chest Wall:    No tenderness or deformity   Heart:    Regular rate and rhythm, S1 and S2 normal, no murmur, rub   or gallop  Breast Exam:    No tenderness, masses, or nipple abnormality  Abdomen:     Soft, non-tender, bowel sounds active all four quadrants,    no masses, no organomegaly  Genitalia:    External genitalia normal  Rectal:    Normal external appearance  Extremities:   Extremities  normal, atraumatic, no cyanosis or edema  Pulses:   2+ and symmetric all extremities  Skin:   Skin color, texture, turgor normal, no rashes or lesions  Lymph nodes:   Cervical, supraclavicular, and axillary nodes normal  Neurologic:   CNII-XII intact, normal strength, sensation and reflexes    throughout          Assessment & Plan:

## 2015-05-09 ENCOUNTER — Telehealth: Payer: Self-pay | Admitting: General Practice

## 2015-05-09 ENCOUNTER — Other Ambulatory Visit: Payer: Self-pay | Admitting: General Practice

## 2015-05-09 MED ORDER — VITAMIN D (ERGOCALCIFEROL) 1.25 MG (50000 UNIT) PO CAPS
50000.0000 [IU] | ORAL_CAPSULE | ORAL | Status: DC
Start: 2015-05-09 — End: 2015-08-07

## 2015-05-09 MED ORDER — ATORVASTATIN CALCIUM 40 MG PO TABS: 40.0000 mg | ORAL_TABLET | Freq: Every day | ORAL | Status: DC

## 2015-05-09 NOTE — Telephone Encounter (Signed)
Patient returned phone call. Best # [phone]. Will be away from her desk from 1:30-2:00pm

## 2015-05-09 NOTE — Telephone Encounter (Signed)
Called pt to give her latest lab results  LMOVM to return call. Per provider Total and LDL cholesterol are again elevated pt needs to restart Lipitor. Vitamin D level is low pt needs to begin 50,000iu supplement once a week fro 12 weeks, and 2000iu of Vitamin D daily (latest level was 21.2) .

## 2015-05-09 NOTE — Telephone Encounter (Signed)
Pt notified, expressed an understanding. meds filled to [facility] on [address] as requested.

## 2015-05-24 ENCOUNTER — Telehealth: Payer: Self-pay | Admitting: Family Medicine

## 2015-05-24 NOTE — Telephone Encounter (Signed)
Attempted to call patient and Rose Bell.  

## 2015-05-24 NOTE — Telephone Encounter (Signed)
Left a message for call back.  Message routed to PCP for FYI.

## 2015-05-24 NOTE — Telephone Encounter (Signed)
Please triage pt and try to determine if there is a hemorrhoid.  I will be happy to see her on Monday but if the bleeding intensifies over the weekend, she will need to be seen

## 2015-05-24 NOTE — Telephone Encounter (Signed)
[facility] - Client TELEPHONE ADVICE RECORD [facility]  Patient Name: Rose Bell  DOB: 12-01-1980    Initial Comment Caller states she has rectal bleeding. Also on the stool. She thought it might be vaginal bleeding, but it's coming from the rectum.   Nurse Assessment  Nurse: Wynetta Emery, RN, Baker Janus Date/Time Eilene Ghazi Time): 05/24/2015 10:07:19 AM  Confirm and document reason for call. If symptomatic, describe symptoms. ---Verdis Frederickson has rectal bleeding x two weeks and it is on stools thought when first noticed it was vaginal bleeding but today was on stool and from rectum heavier today than usual.  Has the patient traveled out of the country within the last 30 days? ---No  Does the patient require triage? ---Yes  Related visit to physician within the last 2 weeks? ---No  Does the PT have any chronic conditions? (i.e. diabetes, asthma, etc.) ---No  Did the patient indicate they were pregnant? ---No     Guidelines    Guideline Title Affirmed Question Affirmed Notes  Rectal Bleeding Blood passed alone without any stool    Final Disposition User   See Physician within 4 Hours (or PCP triage) Wynetta Emery, RN, Baker Janus    Comments  ATTN: appt given for 05/27/2015 1030am K. Birdie Riddle, MD c/o rectal bleed does not want to see anyone but her (no available appt today)   Referrals  REFERRED TO PCP OFFICE   Disagree/Comply: Comply

## 2015-05-27 ENCOUNTER — Ambulatory Visit: Payer: Self-pay | Admitting: Family Medicine

## 2015-05-27 NOTE — Telephone Encounter (Signed)
Patient (patient LVM 05/24/15 at 11:46am canceling appointment for 05/27/15).   Called patient to follow up.  Left voice message requesting call back.

## 2015-05-27 NOTE — Telephone Encounter (Signed)
Encounter closed until pt calls back.

## 2015-06-03 ENCOUNTER — Telehealth: Payer: Self-pay | Admitting: Family Medicine

## 2015-06-03 NOTE — Telephone Encounter (Signed)
Called patient and left voicemail on machine.

## 2015-06-03 NOTE — Telephone Encounter (Signed)
If the flow is light, there is no concern for a longer period.  This can be related to stress, change in diet, increased exercise.  Spotting or light flow can continue for some time as our bodies go through changes.

## 2015-06-03 NOTE — Telephone Encounter (Signed)
Relation to pt: self  Call back number: [phone] leave message  Pharmacy:  Reason for call:  Patient states she has been on her menstrual for 11 days and typically she is on her menstrual for 7 days, patient unsure if provider would like to see her while she is on her menstrual. Please advise

## 2015-06-03 NOTE — Telephone Encounter (Signed)
Patient states she just wanted to notify MD of long cycle and see if this is a reason to be seen.  She has no lightheadedness, states she has no other symptoms at all except abdominal/pelvic pain- in hip area which she describes as throbbing.  She states that her flow is light, she has had it for 11 days and it is continuing now (normally 7 days).  No changes in medications, no OTC or vitamin changes.  She has been trying to lose weight so she states she is eating a bit less but nothing drastic.  No urinary symptoms.    Patient would just like to know if she should be concerned. Please advise.

## 2015-06-03 NOTE — Telephone Encounter (Signed)
Can you please speak with pt and verify if she is having any other symptoms other than a long cycle? Per records pt is UTD on pap, PCP completes these

## 2015-06-04 NOTE — Telephone Encounter (Signed)
Notified patient and she stated understanding.  

## 2015-08-01 ENCOUNTER — Telehealth: Payer: Self-pay

## 2015-08-01 ENCOUNTER — Telehealth: Payer: Self-pay | Admitting: Family Medicine

## 2015-08-01 NOTE — Telephone Encounter (Signed)
error 

## 2015-08-01 NOTE — Telephone Encounter (Signed)
Caller name: Avery Eustice   Relationship to patient: Self   Can be reached: [phone]   Reason for call: Pt called in to follow up on a form that she faxed to [place] in September. She said its an appeal form from [employer] that has a deadline of 08/19/15 to be back to her insurance carrier.. She says that it has been a month and she haven't heard anything back, not sure if received. Advised pt to refax. Pt states that she is right now.

## 2015-08-01 NOTE — Telephone Encounter (Signed)
Just received the form via fax. Filled out as much as possible and forwarded to Dr. Birdie Riddle. JG//CMA

## 2015-08-01 NOTE — Telephone Encounter (Signed)
I do not recall this form but I have completed all paperwork that is in my folder

## 2015-08-02 NOTE — Telephone Encounter (Signed)
Completed form emailed to pt at [email or web address]. Sent for scanning. JG//CMA

## 2015-08-07 ENCOUNTER — Other Ambulatory Visit: Payer: Self-pay | Admitting: General Practice

## 2015-08-07 MED ORDER — VITAMIN D (ERGOCALCIFEROL) 1.25 MG (50000 UNIT) PO CAPS: 50000.0000 [IU] | ORAL_CAPSULE | ORAL | Status: DC

## 2015-08-07 MED ORDER — BUPROPION HCL ER (XL) 150 MG PO TB24
150.0000 mg | ORAL_TABLET | Freq: Every day | ORAL | Status: DC
Start: 2015-08-07 — End: 2015-12-07

## 2015-09-25 ENCOUNTER — Other Ambulatory Visit: Payer: Self-pay | Admitting: Family Medicine

## 2015-09-25 NOTE — Telephone Encounter (Signed)
Medication filled to pharmacy as requested.   

## 2015-12-07 ENCOUNTER — Other Ambulatory Visit: Payer: Self-pay | Admitting: Family Medicine

## 2015-12-09 NOTE — Telephone Encounter (Signed)
Medication filled to pharmacy as requested.  Letter mailed to pt to make cholesterol follow up.

## 2016-01-07 ENCOUNTER — Telehealth: Payer: Self-pay | Admitting: Family Medicine

## 2016-01-07 NOTE — Telephone Encounter (Signed)
Patient Name: Rose Bell  DOB: 1981-06-01    Initial Comment Caller states her bp is 171/101, feels throbbing in head   Nurse Assessment  Nurse: Mallie Mussel, RN, Alveta Heimlich Date/Time Eilene Ghazi Time): 01/07/2016 10:15:27 AM  Confirm and document reason for call. If symptomatic, describe symptoms. You must click the next button to save text entered. ---Caller states that her BP was 171/101 about 10 minutes ago. She has had a throbbing headache which began today. She rates her headache as mild at this point. Denies fever and blurry vision. She denies unilateral weakness and numbness.  Has the patient traveled out of the country within the last 30 days? ---No  Does the patient have any new or worsening symptoms? ---Yes  Will a triage be completed? ---Yes  Related visit to physician within the last 2 weeks? ---No  Does the PT have any chronic conditions? (i.e. diabetes, asthma, etc.) ---Yes  List chronic conditions. ---Hypercholesterolemia  Is the patient pregnant or possibly pregnant? (Ask all females between the ages of 39-55) ---No  Is this a behavioral health or substance abuse call? ---No     Guidelines    Guideline Title Affirmed Question Affirmed Notes  High Blood Pressure BP ? 160/100    Final Disposition User   See PCP When Office is Open (within 3 days) Mallie Mussel, RN, Alveta Heimlich    Comments  Dr. Birdie Riddle did not have any appointments today or tomorrow. She had several blocked tomorrow. I called the office and spoke with [place] who states that Dr. Birdie Riddle is moving tomorrow to another office. We discussed the PA's. She suggested that since she will be a new patient for them to schedule her for 30 minutes instead of 15. Both of them had a couple 30 minute slots available. She chose [name] PA. Appointment was scheduled for 10:30 am tomorrow.   Referrals  REFERRED TO PCP OFFICE   Disagree/Comply: Comply

## 2016-01-08 ENCOUNTER — Emergency Department (HOSPITAL_BASED_OUTPATIENT_CLINIC_OR_DEPARTMENT_OTHER): Payer: 59

## 2016-01-08 ENCOUNTER — Ambulatory Visit (INDEPENDENT_AMBULATORY_CARE_PROVIDER_SITE_OTHER): Payer: 59 | Admitting: Physician Assistant

## 2016-01-08 ENCOUNTER — Encounter: Payer: Self-pay | Admitting: Physician Assistant

## 2016-01-08 ENCOUNTER — Emergency Department (HOSPITAL_BASED_OUTPATIENT_CLINIC_OR_DEPARTMENT_OTHER)
Admission: EM | Admit: 2016-01-08 | Discharge: 2016-01-08 | Disposition: A | Discharge status: Home / Self Care | Payer: 59 | Attending: Emergency Medicine | Admitting: Emergency Medicine

## 2016-01-08 ENCOUNTER — Encounter (HOSPITAL_BASED_OUTPATIENT_CLINIC_OR_DEPARTMENT_OTHER): Payer: Self-pay | Admitting: Emergency Medicine

## 2016-01-08 VITALS — BP 180/80 | HR 89 | Temp 98.1°F | Ht 63.0 in | Wt 186.6 lb

## 2016-01-08 DIAGNOSIS — G4482 Headache associated with sexual activity: Secondary | ICD-10-CM | POA: Insufficient documentation

## 2016-01-08 DIAGNOSIS — I10 Essential (primary) hypertension: Secondary | ICD-10-CM

## 2016-01-08 DIAGNOSIS — R519 Headache, unspecified: Secondary | ICD-10-CM

## 2016-01-08 DIAGNOSIS — R51 Headache: Secondary | ICD-10-CM

## 2016-01-08 DIAGNOSIS — Z79899 Other long term (current) drug therapy: Secondary | ICD-10-CM | POA: Insufficient documentation

## 2016-01-08 DIAGNOSIS — E785 Hyperlipidemia, unspecified: Secondary | ICD-10-CM | POA: Diagnosis not present

## 2016-01-08 NOTE — ED Notes (Signed)
Patient states that she went to her MD upstairs because she was having a pain in her head. She reports that she took her BP and it was elevated. The patient also started to have more twinges of pain in her head at MD's office and they recommended her to come here.

## 2016-01-08 NOTE — Assessment & Plan Note (Signed)
With two episodes of sudden severe pain and "popping" in the head. Worsening headache from onset of visit to the end of visit. BP elevated which is new for patinet. EKG with NSR. Exam negative for meningial signs but patient endorses significant neck stiffness along with worsening headache. Patient triaged to ER for assessment and emergent imaging to r/o any bleed.

## 2016-01-08 NOTE — Progress Notes (Signed)
Patient presents to clinic today c/o new onset of hypertension since this past weekend. Endorses mild headache this past weekend. Notes during intercourse with husband Sunday night she developed a severe headache described as the worst headache of her life. Endorses she went to bed with resolution of headache. Has had mild headache since that time. Last night had another episode of severe stabbing headache with sensation that something "popped in her head". Has had headache since that time with difficulty focusing and stiffness in the neck. Denies chest pain, palpitations, dizziness, nausea or vomiting. States headache has increasing in severity since this morning.   Past Medical History  Diagnosis Date  . Hyperlipemia     Current Outpatient Prescriptions on File Prior to Visit  Medication Sig Dispense Refill  . atorvastatin (LIPITOR) 40 MG tablet Take 1 tablet by mouth  daily 90 tablet 1  . Cyanocobalamin (B-12 PO) Take 1 tablet by mouth daily.    Marland Kitchen buPROPion (WELLBUTRIN XL) 150 MG 24 hr tablet Take 1 tablet by mouth  daily (Patient not taking: Reported on 01/08/2016) 90 tablet 0  . polyethylene glycol powder (GLYCOLAX/MIRALAX) powder Take 17 g by mouth daily. (Patient not taking: Reported on 05/06/2015) 3350 g 6   No current facility-administered medications on file prior to visit.    Allergies  Allergen Reactions  . Phentermine Swelling    After 3 days pt says tongue swells and was having trouble with speech    Family History  Problem Relation Age of Onset  . Diabetes Mother   . Diabetes      father side of family  . Hypertension Mother   . Hypothyroidism Mother     Social History   Social History  . Marital Status: Married    Spouse Name: N/A  . Number of Children: N/A  . Years of Education: N/A   Social History Main Topics  . Smoking status: Never Smoker   . Smokeless tobacco: None  . Alcohol Use: Yes     Comment: socially  . Drug Use: No  . Sexual Activity: Not  Asked   Other Topics Concern  . None   Social History Narrative   Review of Systems - See HPI.  All other ROS are negative.  BP 180/80 mmHg  Pulse 89  Temp(Src) 98.1 F (36.7 C) (Oral)  Ht 5\' 3"  (1.6 m)  Wt 186 lb 9.6 oz (84.641 kg)  BMI 33.06 kg/m2  SpO2 98%  LMP 12/20/2015  Physical Exam  Constitutional: She is oriented to person, place, and time and well-developed, well-nourished, and in no distress.  Patient with head in hands due to worsening headache.   HENT:  Head: Normocephalic and atraumatic.  Eyes: Conjunctivae are normal. Pupils are equal, round, and reactive to light.  Cardiovascular: Normal rate, regular rhythm, normal heart sounds and intact distal pulses.   Pulmonary/Chest: Effort normal and breath sounds normal. No respiratory distress. She has no wheezes. She has no rales. She exhibits no tenderness.  Neurological: She is alert and oriented to person, place, and time. No cranial nerve deficit.  Skin: Skin is warm and dry.  Vitals reviewed.    Assessment/Plan: Severe headache With two episodes of sudden severe pain and "popping" in the head. Worsening headache from onset of visit to the end of visit. BP elevated which is new for patinet. EKG with NSR. Exam negative for meningial signs but patient endorses significant neck stiffness along with worsening headache. Patient triaged to ER for assessment and  emergent imaging to r/o any bleed.

## 2016-01-08 NOTE — Patient Instructions (Signed)
Giving you worsening headache with new onset elevated BP associated with these severe popping sensation in the head, I am sending you downstairs for a more emergent assessment to make sure everything is ok.

## 2016-01-08 NOTE — Progress Notes (Signed)
Pre visit review using our clinic review tool, if applicable. No additional management support is needed unless otherwise documented below in the visit note. 

## 2016-01-08 NOTE — ED Notes (Signed)
Pt reports sudden "explosive" headache with sexual orgasm on Saturday, headache subsided, had sex again on Sunday, sudden onset of headache with orgasm, resolved, again had sex on Monday and had sudden onset of headache with orgasm. Pt states she does not have headache at this time. Pt states her blood pressure was 180/80, yesterday at work was 171/100. Denies any visual changes.

## 2016-01-08 NOTE — ED Provider Notes (Signed)
CSN: [id number]     Arrival date & time 01/08/16  1125 History   First MD Initiated Contact with Patient 01/08/16 1214     Chief Complaint  Patient presents with  . Headache     (Consider location/radiation/quality/duration/timing/severity/associated sxs/prior Treatment) HPI  Pt presenting with c/o headache in the setting of hypertension.  She was referred by PMD office upstairs to have a head CT. Pt states she has had 3 separate headaches over the weekend and yesterday.  Headaches have subsided.  Her BP was elevated at 170/100 yesterday and 180/80 today at doctors office.  No changes in vision or speech, no focal weakness.  No neck pain or stiffness. No changes in vision or speech.  There are no other associated systemic symptoms, there are no other alleviating or modifying factors.   Past Medical History  Diagnosis Date  . Hyperlipemia    History reviewed. No pertinent past surgical history. Family History  Problem Relation Age of Onset  . Diabetes Mother   . Diabetes      father side of family  . Hypertension Mother   . Hypothyroidism Mother    Social History  Substance Use Topics  . Smoking status: Never Smoker   . Smokeless tobacco: None  . Alcohol Use: Yes     Comment: socially   OB History    No data available     Review of Systems  ROS reviewed and all otherwise negative except for mentioned in HPI    Allergies  Phentermine  Home Medications   Prior to Admission medications   Medication Sig Start Date End Date Taking? Authorizing Provider  atorvastatin (LIPITOR) 40 MG tablet Take 1 tablet by mouth  daily 09/25/15   Midge Minium, MD  buPROPion (WELLBUTRIN XL) 150 MG 24 hr tablet Take 1 tablet by mouth  daily Patient not taking: Reported on 01/08/2016 12/09/15   Midge Minium, MD  Cyanocobalamin (B-12 PO) Take 1 tablet by mouth daily.    Historical Provider, MD  OVER THE COUNTER MEDICATION Vitamin D3-Take 1 capsule by mouth daily.    Historical  Provider, MD  polyethylene glycol powder (GLYCOLAX/MIRALAX) powder Take 17 g by mouth daily. Patient not taking: Reported on 05/06/2015 02/18/15   Midge Minium, MD   BP 136/86 mmHg  Pulse 79  Temp(Src) 98.6 F (37 C) (Oral)  Resp 18  Ht 5\' 2"  (1.575 m)  Wt 186 lb (84.369 kg)  BMI 34.01 kg/m2  SpO2 99%  LMP 12/20/2015  Vitals reviewed Physical Exam  Physical Examination: General appearance - alert, well appearing, and in no distress Mental status - alert, oriented to person, place, and time Eyes - pupils equal and reactive, extraocular eye movements intact Mouth - mucous membranes moist, pharynx normal without lesions Neck - supple, no significant adenopathy Chest - clear to auscultation, no wheezes, rales or rhonchi, symmetric air entry Heart - normal rate, regular rhythm, normal S1, S2, no murmurs, rubs, clicks or gallops Abdomen - soft, nontender, nondistended, no masses or organomegaly Neurological - alert, oriented x 3, normal speech, cranial nerves 2-12 tested and intact, strength 5/5 in extremities x 4, sensation intact Extremities - peripheral pulses normal, no pedal edema, no clubbing or cyanosis Skin - normal coloration and turgor, no rashes  ED Course  Procedures (including critical care time) Labs Review Labs Reviewed - No data to display  Imaging Review Ct Head Wo Contrast  01/08/2016  CLINICAL DATA:  Pain in top of head for several  days. Elevated blood pressure. EXAM: CT HEAD WITHOUT CONTRAST TECHNIQUE: Contiguous axial images were obtained from the base of the skull through the vertex without intravenous contrast. COMPARISON:  None. FINDINGS: No evidence for acute infarction, hemorrhage, mass lesion, hydrocephalus, or extra-axial fluid. Mild BILATERAL frontal lobe volume loss, nonspecific, with prominence of the extracerebral CSF spaces over the frontal lobes. No signs of previous cortical infarction. No white matter disease of significance. Calvarium intact. No  layering fluid in the sinuses or mastoids. IMPRESSION: Query premature frontal atrophy for age. No acute intracranial findings. Electronically Signed   By: Staci Righter M.D.   On: 01/08/2016 13:11   I have personally reviewed and evaluated these images and lab results as part of my medical decision-making.   EKG Interpretation None      MDM   Final diagnoses:  Essential hypertension  Nonintractable episodic headache, unspecified headache type    Pt presenting with c/o headache in the setting of hypertension.  Pt currently does not have headache in the ED.  Normal neuro exam.  She was referred by her PMD to have head CT performed, this was done and reassuring- no acute findings.  Discharged with strict return precautions.  Pt agreeable with plan.    Alfonzo Beers, MD 01/08/16 [phone]

## 2016-01-08 NOTE — Discharge Instructions (Signed)
Return to the ED with any concerns including weakness of arms or legs, changes in vision or speech, fainting, vomiting and not able to keep down liquids, chest pain, difficulty breathing, decreased level of alertness/lethargy, or any other alarming symptoms

## 2016-01-08 NOTE — ED Notes (Signed)
MD at bedside. 

## 2016-01-10 ENCOUNTER — Ambulatory Visit (INDEPENDENT_AMBULATORY_CARE_PROVIDER_SITE_OTHER): Payer: 59 | Admitting: Family Medicine

## 2016-01-10 ENCOUNTER — Encounter: Payer: Self-pay | Admitting: Family Medicine

## 2016-01-10 VITALS — BP 152/88 | HR 72 | Temp 98.1°F | Resp 16 | Ht 63.0 in | Wt 187.5 lb

## 2016-01-10 DIAGNOSIS — I10 Essential (primary) hypertension: Secondary | ICD-10-CM

## 2016-01-10 DIAGNOSIS — G4482 Headache associated with sexual activity: Secondary | ICD-10-CM | POA: Diagnosis not present

## 2016-01-10 MED ORDER — SUMATRIPTAN 20 MG/ACT NA SOLN: 20.0000 mg | Freq: Once | NASAL | Status: DC

## 2016-01-10 MED ORDER — HYDROCHLOROTHIAZIDE 12.5 MG PO TABS: 12.5000 mg | ORAL_TABLET | Freq: Every day | ORAL | Status: DC

## 2016-01-10 NOTE — Progress Notes (Signed)
   Subjective:    Patient ID: Rose Bell, female    DOB: 14-Dec-1980, 35 y.o.   MRN: [id number]  HPI HA- pt was seen 3/22 for severe HA and elevated BP.  Had head CT which didn't show any acute abnormality including bleed.  6 days ago developed severe pain during orgasm- subsided in ~1 hr.  Occurred again Sunday and Monday w/ intercourse.  BP at work was elevated- 171/100, repeat 150/98.  HA is localized to top of head   Review of Systems For ROS see HPI     Objective:   Physical Exam  Constitutional: She is oriented to person, place, and time. She appears well-developed and well-nourished. No distress.  HENT:  Head: Normocephalic and atraumatic.  TMs WNL No TTP over sinuses Minimal nasal congestion  Eyes: Conjunctivae and EOM are normal. Pupils are equal, round, and reactive to light.  Neck: Normal range of motion. Neck supple.  Cardiovascular: Normal rate, regular rhythm, normal heart sounds and intact distal pulses.   Pulmonary/Chest: Effort normal and breath sounds normal. No respiratory distress. She has no wheezes. She has no rales.  Lymphadenopathy:    She has no cervical adenopathy.  Neurological: She is alert and oriented to person, place, and time. She has normal reflexes. No cranial nerve deficit. Coordination normal.  Psychiatric: She has a normal mood and affect. Her behavior is normal. Judgment and thought content normal.  Vitals reviewed.         Assessment & Plan:

## 2016-01-10 NOTE — Progress Notes (Signed)
Pre visit review using our clinic review tool, if applicable. No additional management support is needed unless otherwise documented below in the visit note. 

## 2016-01-10 NOTE — Patient Instructions (Signed)
Follow up in 1 month to recheck BP We'll notify you of your lab results and make any changes if needed Drink plenty of fluids Use the nasal spray- 1 spray if you develop a headache.  May repeat in 2 hrs if not improving (by then, be en route to ER) Start the HCTZ daily for BP Limit your salt intake We'll call you with your neuro appt Call with any questions or concerns Hang in there!!!

## 2016-01-12 NOTE — Assessment & Plan Note (Signed)
New.  Pt's BP has been elevated which may be causing her underlying headache.  Start HCTZ.  Discussed importance of low Na diet and regular exercise.  Check TSH, CBC, BMP.  Pt to f/u in 4 weeks.  Reviewed supportive care and red flags that should prompt return.  Pt expressed understanding and is in agreement w/ plan.

## 2016-01-12 NOTE — Assessment & Plan Note (Signed)
New.  Pt's HA description and severe pain at point of climax are consistent w/ orgasmic HA.  Pt needs neuro evaluation.  Will start nasal imitrex as recommended in UpToDate article.  Pt had negative CT scan for acute bleed.  Will hold on future imaging until neuro determines which is most appropriate although I feel she will likely need either MRI, MRA or both.  Pt expressed understanding and is in agreement w/ plan.

## 2016-01-13 ENCOUNTER — Other Ambulatory Visit (INDEPENDENT_AMBULATORY_CARE_PROVIDER_SITE_OTHER): Payer: 59

## 2016-01-13 DIAGNOSIS — I10 Essential (primary) hypertension: Secondary | ICD-10-CM

## 2016-01-13 LAB — CBC AND DIFFERENTIAL
HCT: 37 % (ref 36–46)
Hemoglobin: 12.3 g/dL (ref 12.0–16.0)
Platelets: 244 10*3/uL (ref 150–399)
WBC: 7.3 10^3/mL

## 2016-01-13 LAB — BASIC METABOLIC PANEL
BUN: 10 mg/dL (ref 4–21)
Creatinine: 0.7 mg/dL (ref <=1.1)
Glucose: 99 mg/dL

## 2016-01-13 LAB — TSH: TSH: 1.78 u[IU]/mL (ref <=5.90)

## 2016-01-14 ENCOUNTER — Telehealth: Payer: Self-pay | Admitting: Family Medicine

## 2016-01-14 NOTE — Telephone Encounter (Signed)
Can you check on this?

## 2016-01-14 NOTE — Telephone Encounter (Signed)
Noted.  Please check on neuro referral for pt

## 2016-01-14 NOTE — Telephone Encounter (Signed)
Pt just wanted Dr Birdie Riddle to know that she has had some sharp pains in her head. Nothing to be concerned about, has not stopped her from working. Pt also states that she should be able to start her Rx today.

## 2016-01-14 NOTE — Telephone Encounter (Signed)
In lB Neuro work que/awaiting appt

## 2016-01-22 ENCOUNTER — Encounter: Payer: Self-pay | Admitting: General Practice

## 2016-02-10 ENCOUNTER — Ambulatory Visit: Payer: 59 | Admitting: Family Medicine

## 2016-02-11 ENCOUNTER — Ambulatory Visit (INDEPENDENT_AMBULATORY_CARE_PROVIDER_SITE_OTHER): Payer: 59 | Admitting: Neurology

## 2016-02-11 ENCOUNTER — Encounter: Payer: Self-pay | Admitting: Neurology

## 2016-02-11 VITALS — BP 136/78 | HR 68 | Ht 63.0 in | Wt 191.5 lb

## 2016-02-11 DIAGNOSIS — G4482 Headache associated with sexual activity: Secondary | ICD-10-CM

## 2016-02-11 DIAGNOSIS — R200 Anesthesia of skin: Secondary | ICD-10-CM | POA: Diagnosis not present

## 2016-02-11 DIAGNOSIS — M5481 Occipital neuralgia: Secondary | ICD-10-CM

## 2016-02-11 DIAGNOSIS — R202 Paresthesia of skin: Secondary | ICD-10-CM

## 2016-02-11 NOTE — Progress Notes (Signed)
NEUROLOGY CONSULTATION NOTE  Rose Bell MRN: [id number] DOB: 03-08-1981  Referring provider: Dr. Birdie Riddle Primary care provider: Dr. Birdie Riddle  Reason for consult:  headache  HISTORY OF PRESENT ILLNESS: Rose Bell is a 35 year old right-handed female who presents for headache.  History obtained by patient and PCP note.  Imaging of head CT reviewed.  In mid-March, she developed sudden onset of severe headache which occurred only when she had an orgasm.  The severe headache was brief but she started having a constant holocephalic throbbing headache, not associated with nausea, visual disturbance, or photophobia.  It resolved after 2 weeks.  She was having sex regularly during this time.  Around this time, her blood pressure was found to be 152/88 and she was started on an antihypertensive.  She had a CT of head from 01/08/16 showed no acute intracranial abnormalities.  Since then, she occasionally has shooting pain radiating up both sides of her occiput.  She notices it while laying in bed.  She has tension in the neck and shoulders at times.  She also will get numbness and tingling in the right arm radiating down into the hand and fingers, noticeable while driving, riding a spin bike or laying in bed.  She has to shake it off.  There is no associated weakness.  PAST MEDICAL HISTORY: Past Medical History  Diagnosis Date  . Hyperlipemia   . Hypertension     PAST SURGICAL HISTORY: History reviewed. No pertinent past surgical history.  MEDICATIONS: Current Outpatient Prescriptions on File Prior to Visit  Medication Sig Dispense Refill  . atorvastatin (LIPITOR) 40 MG tablet Take 1 tablet by mouth  daily 90 tablet 1  . Cyanocobalamin (B-12 PO) Take 1 tablet by mouth daily.    . hydrochlorothiazide (HYDRODIURIL) 12.5 MG tablet Take 1 tablet (12.5 mg total) by mouth daily. 30 tablet 3  . OVER THE COUNTER MEDICATION Vitamin D3-Take 1 capsule by mouth daily.    . SUMAtriptan (IMITREX) 20  MG/ACT nasal spray Place 1 spray (20 mg total) into the nose once. May repeat in 2 hours if headache persists or recurs. (Patient not taking: Reported on 02/11/2016) 1 Inhaler 3   No current facility-administered medications on file prior to visit.    ALLERGIES: Allergies  Allergen Reactions  . Phentermine Swelling    After 3 days pt says tongue swells and was having trouble with speech    FAMILY HISTORY: Family History  Problem Relation Age of Onset  . Diabetes Mother   . Diabetes      father side of family  . Hypertension Mother   . Hypothyroidism Mother     SOCIAL HISTORY: Social History   Social History  . Marital Status: Married    Spouse Name: N/A  . Number of Children: N/A  . Years of Education: N/A   Occupational History  . Not on file.   Social History Main Topics  . Smoking status: Never Smoker   . Smokeless tobacco: Not on file  . Alcohol Use: Yes     Comment: socially  . Drug Use: No  . Sexual Activity: Not on file   Other Topics Concern  . Not on file   Social History Narrative    REVIEW OF SYSTEMS: Constitutional: No fevers, chills, or sweats, no generalized fatigue, change in appetite Eyes: No visual changes, double vision, eye pain Ear, nose and throat: No hearing loss, ear pain, nasal congestion, sore throat Cardiovascular: No chest pain, palpitations Respiratory:  No  shortness of breath at rest or with exertion, wheezes GastrointestinaI: No nausea, vomiting, diarrhea, abdominal pain, fecal incontinence Genitourinary:  No dysuria, urinary retention or frequency Musculoskeletal:  No neck pain, back pain Integumentary: No rash, pruritus, skin lesions Neurological: as above Psychiatric: No depression, insomnia, anxiety Endocrine: No palpitations, fatigue, diaphoresis, mood swings, change in appetite, change in weight, increased thirst Hematologic/Lymphatic:  No anemia, purpura, petechiae. Allergic/Immunologic: no itchy/runny eyes, nasal  congestion, recent allergic reactions, rashes  PHYSICAL EXAM: Filed Vitals:   02/11/16 0803  BP: 136/78  Pulse: 68   General: No acute distress.  Patient appears well-groomed.  Head:  Normocephalic/atraumatic Eyes:  fundi examined but not visualized Neck: supple, no paraspinal tenderness, full range of motion Back: No paraspinal tenderness Heart: regular rate and rhythm Lungs: Clear to auscultation bilaterally. Vascular: No carotid bruits. Neurological Exam: Mental status: alert and oriented to person, place, and time, recent and remote memory intact, fund of knowledge intact, attention and concentration intact, speech fluent and not dysarthric, language intact. Cranial nerves: CN I: not tested CN II: pupils equal, round and reactive to light, visual fields intact CN III, IV, VI:  full range of motion, no nystagmus, no ptosis CN V: facial sensation intact CN VII: upper and lower face symmetric CN VIII: hearing intact CN IX, X: gag intact, uvula midline CN XI: sternocleidomastoid and trapezius muscles intact CN XII: tongue midline Bulk & Tone: normal, no fasciculations. Motor:  5/5 throughout  Sensation:  Pinprick, temperature and vibration sensation intact. . Deep Tendon Reflexes:  2+ throughout, toes downgoing.  Finger to nose testing:  Without dysmetria.  Heel to shin:  Without dysmetria.  Gait:  Normal station and stride.  Able to turn and tandem walk. Romberg negative.  IMPRESSION: 1.  Probable primary headache associated with sexual activity.  Often this remits on its own. 2.  Probable occipital neuralgia, fairly well-controlled 3.  Possible carpal tunnel syndrome  PLAN: 1.  I want to rule out any potential vascular cause of the headache, so we will get CTA of the head. 2.  If they should recur, we can prescribe her indomethacin, which she can take prior to coitus.  Otherwise, propranolol is often used. 3.  She may consider wrist splint for right hand.  If it gets  worse, NCV-EMG 4.  Follow up   Thank you for allowing me to take part in the care of this patient.  Metta Clines, DO  CC:  Annye Asa, MD

## 2016-02-11 NOTE — Patient Instructions (Signed)
1.  I don't think anything serious is causing the headache, but just to be sure, I would like to get a CTA of the head to look at the blood vessels. 2.  If it should recur, then contact me and I can prescribe a medication. 3.  Follow up in 4 months or so.

## 2016-02-21 ENCOUNTER — Encounter: Payer: Self-pay | Admitting: Family Medicine

## 2016-02-21 ENCOUNTER — Ambulatory Visit (INDEPENDENT_AMBULATORY_CARE_PROVIDER_SITE_OTHER): Payer: 59 | Admitting: Family Medicine

## 2016-02-21 VITALS — BP 129/80 | HR 80 | Temp 98.1°F | Resp 16 | Wt 190.0 lb

## 2016-02-21 DIAGNOSIS — I10 Essential (primary) hypertension: Secondary | ICD-10-CM | POA: Diagnosis not present

## 2016-02-21 MED ORDER — ATORVASTATIN CALCIUM 40 MG PO TABS: ORAL_TABLET | ORAL | Status: DC

## 2016-02-21 NOTE — Progress Notes (Signed)
   Subjective:    Patient ID: Rose Bell, female    DOB: 1981-04-18, 35 y.o.   MRN: [id number]  HPI HTN- new dx at last visit.  Started on HCTZ 5 weeks ago.  BP is much better today than previous.  No recent HAs.  No CP, SOB, visual changes, edema.  Exercising regularly- losing weight.     Review of Systems For ROS see HPI     Objective:   Physical Exam  Constitutional: She is oriented to person, place, and time. She appears well-developed and well-nourished. No distress.  HENT:  Head: Normocephalic and atraumatic.  Eyes: Conjunctivae and EOM are normal. Pupils are equal, round, and reactive to light.  Neck: Normal range of motion. Neck supple. No thyromegaly present.  Cardiovascular: Normal rate, regular rhythm, normal heart sounds and intact distal pulses.   No murmur heard. Pulmonary/Chest: Effort normal and breath sounds normal. No respiratory distress.  Abdominal: Soft. She exhibits no distension. There is no tenderness.  Musculoskeletal: She exhibits no edema.  Lymphadenopathy:    She has no cervical adenopathy.  Neurological: She is alert and oriented to person, place, and time.  Skin: Skin is warm and dry.  Psychiatric: She has a normal mood and affect. Her behavior is normal.  Vitals reviewed.         Assessment & Plan:

## 2016-02-21 NOTE — Assessment & Plan Note (Signed)
New problem for pt at last visit.  BP is much better controlled on HCTZ.  HA has resolved.  Currently asymptomatic.  No side effects from medication.  Check BMP.  Will follow.

## 2016-02-21 NOTE — Patient Instructions (Signed)
Schedule your complete physical for sometime after July We'll notify you of your lab results and make any changes if needed Continue the HCTZ daily Keep up the great work on healthy diet and regular exercise- you look fantastic! Call with any questions or concerns Happy Spring!!!

## 2016-02-21 NOTE — Progress Notes (Signed)
Pre visit review using our clinic review tool, if applicable. No additional management support is needed unless otherwise documented below in the visit note. 

## 2016-02-22 LAB — BASIC METABOLIC PANEL
BUN/Creatinine Ratio: 11 (ref 9–23)
BUN: 8 mg/dL (ref 6–20)
CO2: 22 mmol/L (ref 18–29)
Calcium: 9.2 mg/dL (ref 8.7–10.2)
Chloride: 103 mmol/L (ref 96–106)
Creatinine, Ser: 0.76 mg/dL (ref 0.57–1.00)
GFR calc Af Amer: 117 mL/min/{1.73_m2} (ref >59)
GFR calc non Af Amer: 101 mL/min/{1.73_m2} (ref >59)
Glucose: 98 mg/dL (ref 65–99)
Potassium: 3.8 mmol/L (ref 3.5–5.2)
Sodium: 140 mmol/L (ref 134–144)

## 2016-02-24 ENCOUNTER — Encounter: Payer: Self-pay | Admitting: General Practice

## 2016-03-02 ENCOUNTER — Ambulatory Visit
Admission: RE | Admit: 2016-03-02 | Discharge: 2016-03-02 | Disposition: A | Discharge status: Home / Self Care | Payer: 59 | Source: Ambulatory Visit | Attending: Neurology | Admitting: Neurology

## 2016-03-02 DIAGNOSIS — G4482 Headache associated with sexual activity: Secondary | ICD-10-CM

## 2016-03-02 DIAGNOSIS — M5481 Occipital neuralgia: Secondary | ICD-10-CM

## 2016-03-02 MED ORDER — IOPAMIDOL (ISOVUE-370) INJECTION 76%
100.0000 mL | Freq: Once | INTRAVENOUS | Status: AC | PRN
  Administered 2016-03-02: 100 mL via INTRAVENOUS

## 2016-04-02 ENCOUNTER — Other Ambulatory Visit: Payer: Self-pay | Admitting: Family Medicine

## 2016-04-02 NOTE — Telephone Encounter (Signed)
Medication filled to pharmacy as requested.   

## 2016-04-14 IMAGING — US US ABDOMEN COMPLETE
1 series · 14 of 25 positions shown · non-contrast
Comparison: None.

CLINICAL DATA: 33-year-old female with right upper quadrant
abdominal pain

EXAM:
ULTRASOUND ABDOMEN COMPLETE

[Series 1: us abdomen complete · 0.26mm/px · 14 of 45 slices shown]
[im 1/45]
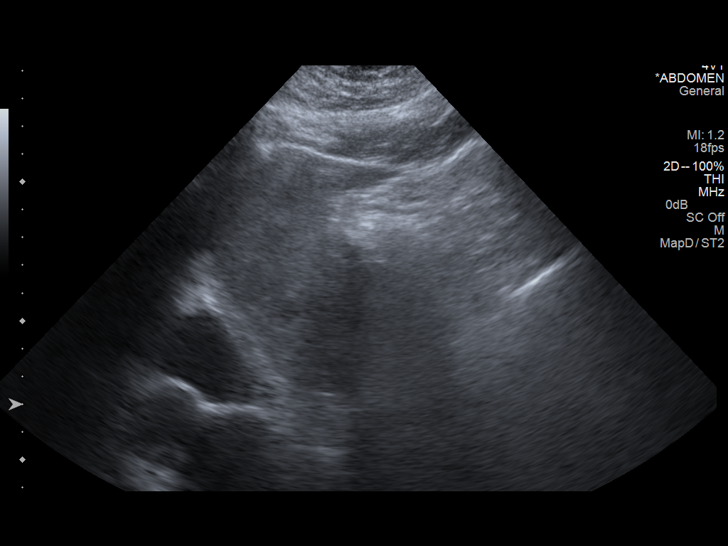
[im 4/45]
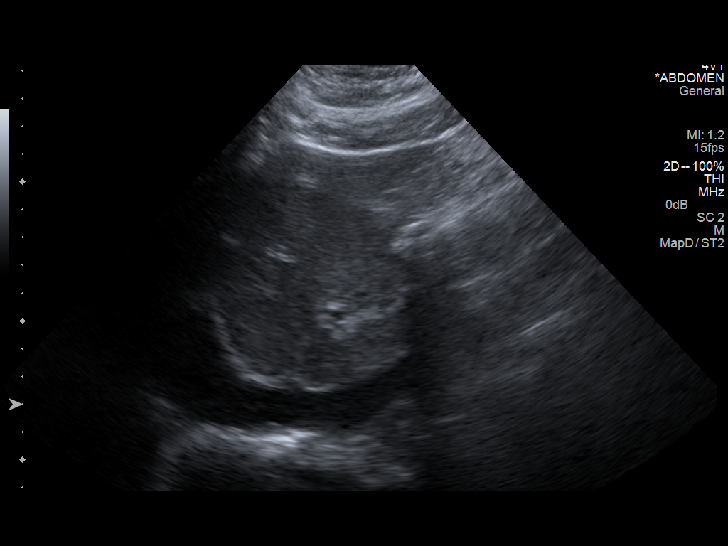
[im 8/45]
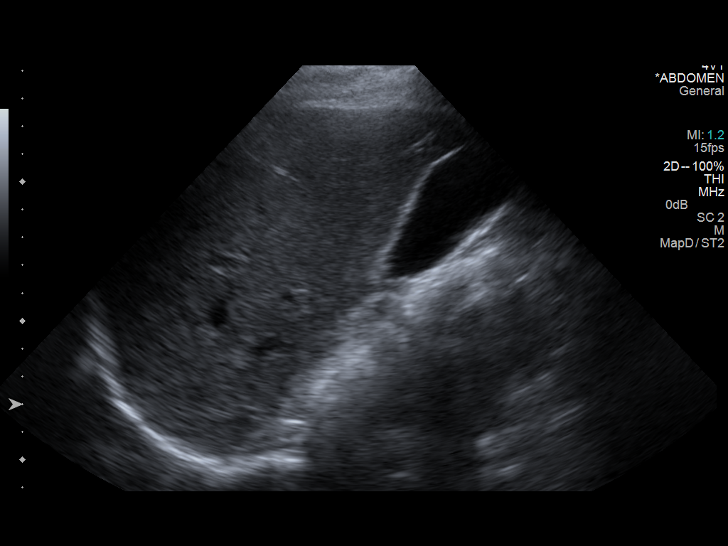
[im 12/45]
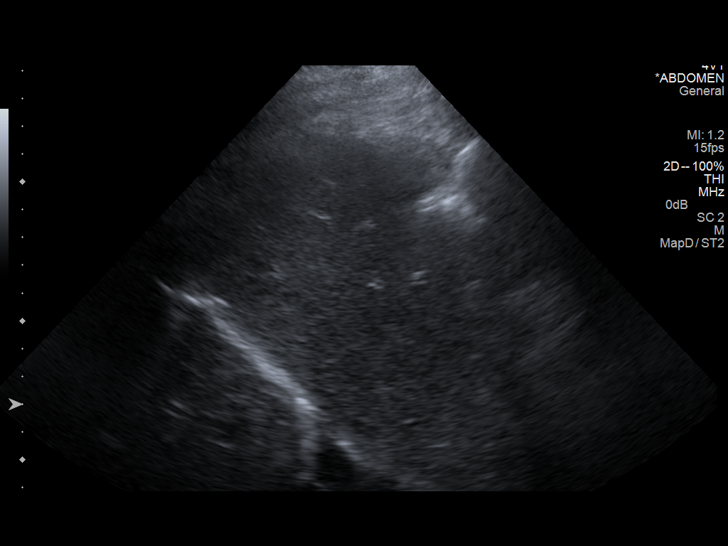
[im 15/45]
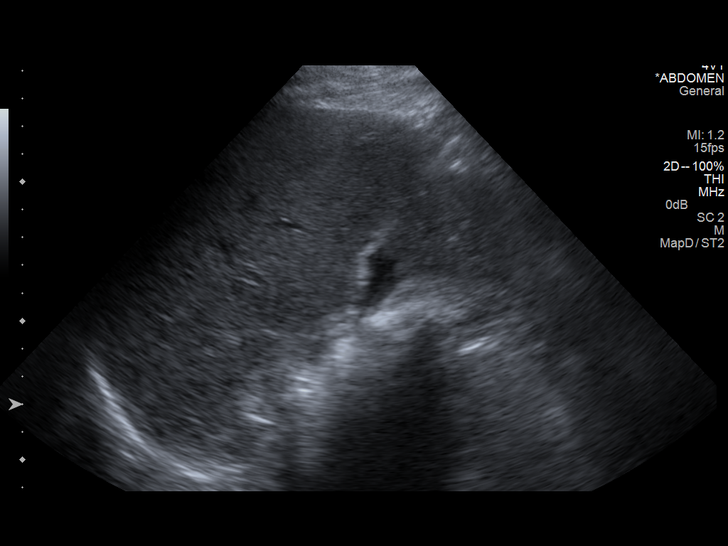
[im 17/45]
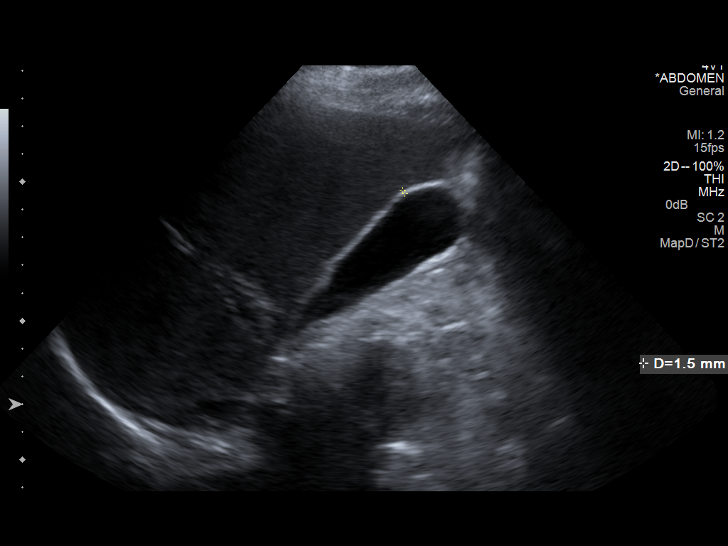
[im 21/45]
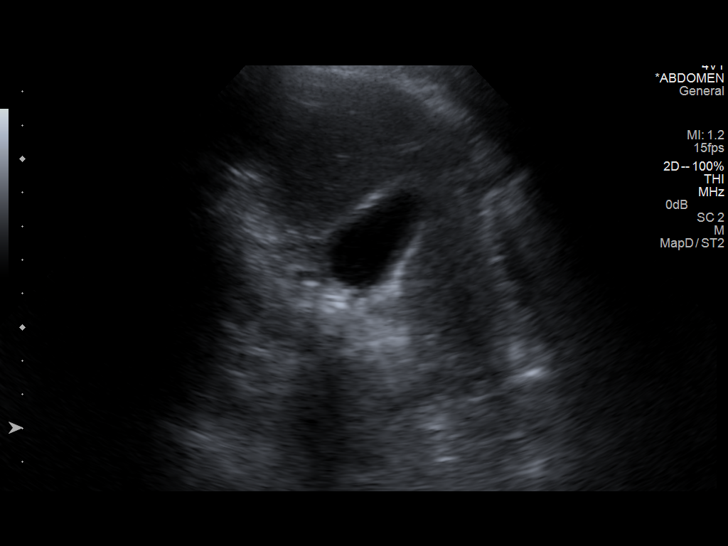
[im 24/45]
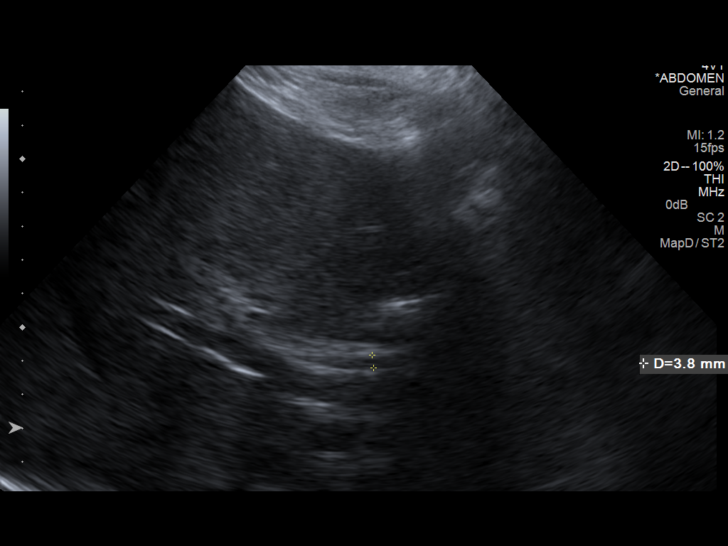
[im 28/45]
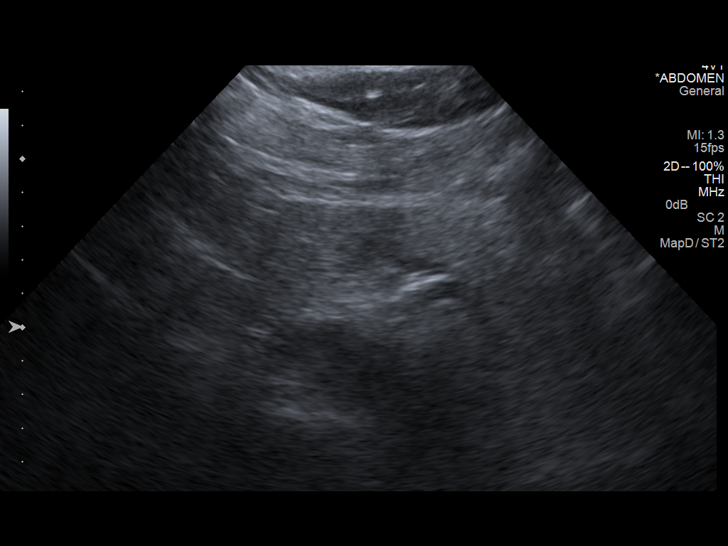
[im 30/45]
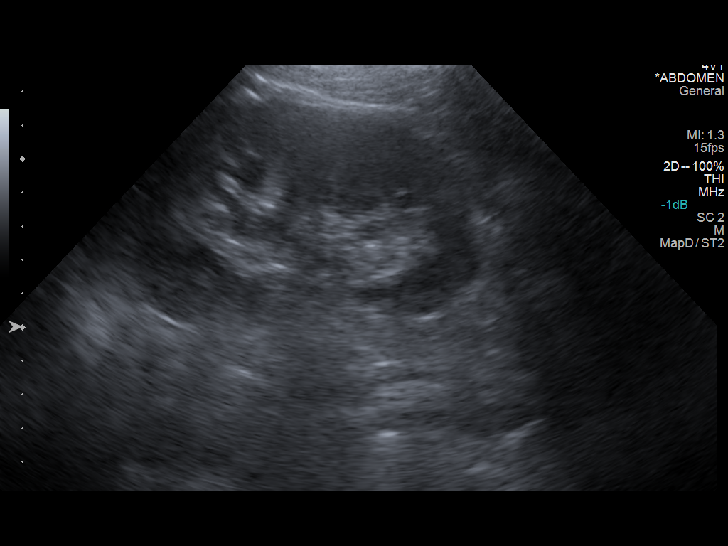
[im 34/45]
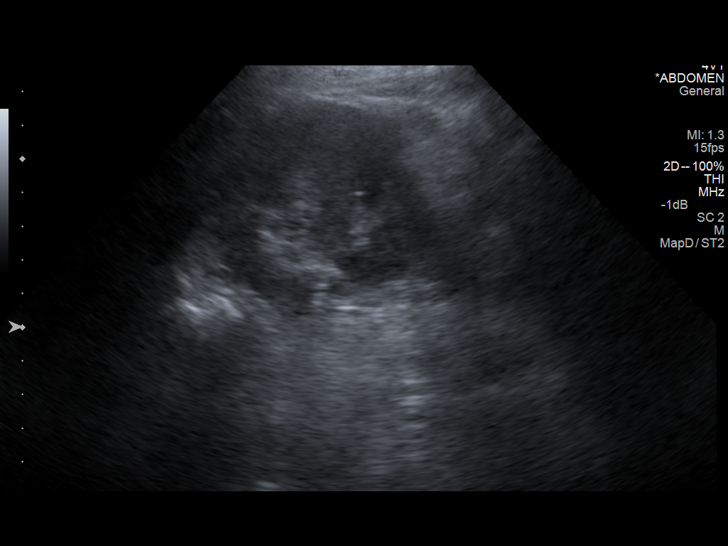
[im 37/45]
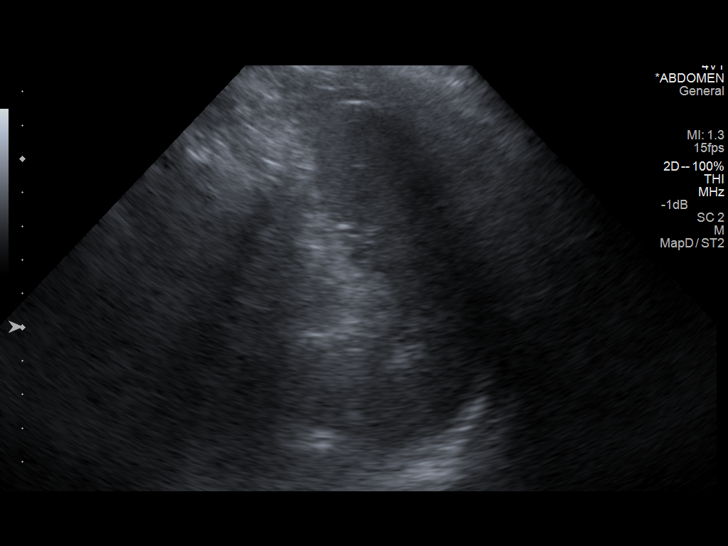
[im 41/45]
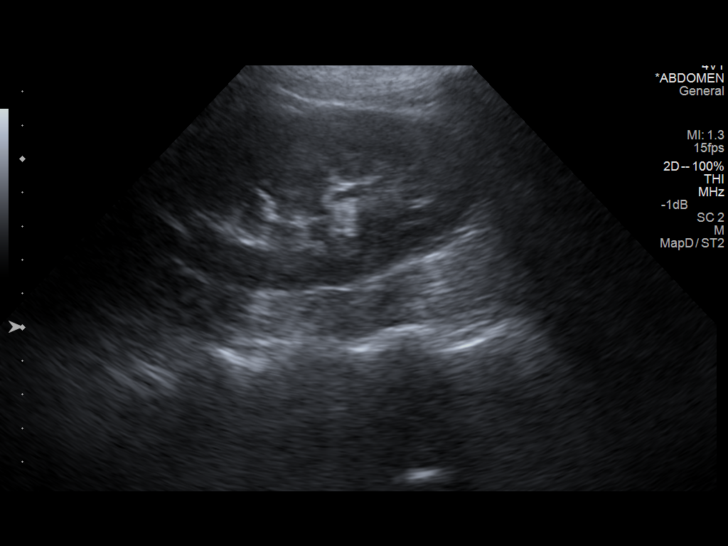
[im 45/45]
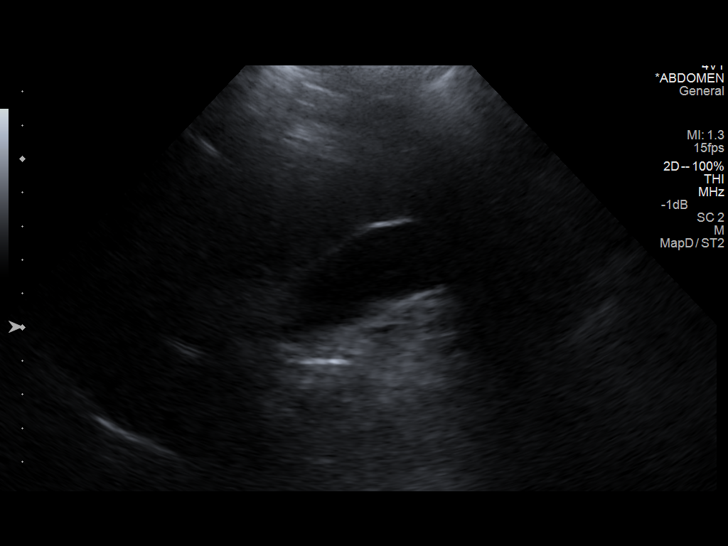

[14 of 25 positions shown; findings below may reference images not displayed]

FINDINGS: Gallbladder: No gallstones or wall thickening visualized. No
sonographic Murphy sign noted.

Common bile duct: Diameter: Within normal limits at 4 mm

Liver: No focal lesion identified. Within normal limits in
parenchymal echogenicity. Main portal vein patent with normal
hepatopetal flow.

IVC: No abnormality visualized.

Pancreas: Visualized portion unremarkable.

Spleen: Size and appearance within normal limits.

Right Kidney: Length: 10.9 cm. Echogenicity within normal limits. No
mass or hydronephrosis visualized.

Left Kidney: Length: 11.9 cm. Echogenicity within normal limits. No
mass or hydronephrosis visualized.

Abdominal aorta: No aneurysm visualized.

Other findings: None.
IMPRESSION: Unremarkable abdominal ultrasound.

## 2016-06-02 ENCOUNTER — Ambulatory Visit (INDEPENDENT_AMBULATORY_CARE_PROVIDER_SITE_OTHER): Payer: 59 | Admitting: Family Medicine

## 2016-06-02 ENCOUNTER — Encounter: Payer: Self-pay | Admitting: Family Medicine

## 2016-06-02 VITALS — BP 130/83 | HR 66 | Temp 98.3°F | Resp 16 | Ht 63.0 in | Wt 180.4 lb

## 2016-06-02 DIAGNOSIS — Z0000 Encounter for general adult medical examination without abnormal findings: Secondary | ICD-10-CM | POA: Diagnosis not present

## 2016-06-02 NOTE — Progress Notes (Signed)
Pre visit review using our clinic review tool, if applicable. No additional management support is needed unless otherwise documented below in the visit note. 

## 2016-06-02 NOTE — Progress Notes (Signed)
   Subjective:    Patient ID: Rose Bell, female    DOB: 09-05-1981, 35 y.o.   MRN: [id number]  HPI CPE- stopped HCTZ and Wellbutrin earlier this year.  Continues to have intermittent HAs.   Review of Systems Patient reports no vision/ hearing changes, adenopathy,fever, weight change,  persistant/recurrent hoarseness , swallowing issues, chest pain, palpitations, edema, persistant/recurrent cough, hemoptysis, dyspnea (rest/exertional/paroxysmal nocturnal), gastrointestinal bleeding (melena, rectal bleeding), abdominal pain, significant heartburn, bowel changes, GU symptoms (dysuria, hematuria, incontinence), Gyn symptoms (abnormal  bleeding, pain),  syncope, focal weakness, memory loss, numbness & tingling, skin/hair/nail changes, abnormal bruising or bleeding, anxiety, or depression.     Objective:   Physical Exam General Appearance:    Alert, cooperative, no distress, appears stated age  Head:    Normocephalic, without obvious abnormality, atraumatic  Eyes:    PERRL, conjunctiva/corneas clear, EOM's intact, fundi    benign, both eyes  Ears:    Normal TM's and external ear canals, both ears  Nose:   Nares normal, septum midline, mucosa normal, no drainage    or sinus tenderness  Throat:   Lips, mucosa, and tongue normal; teeth and gums normal  Neck:   Supple, symmetrical, trachea midline, no adenopathy;    Thyroid: no enlargement/tenderness/nodules  Back:     Symmetric, no curvature, ROM normal, no CVA tenderness  Lungs:     Clear to auscultation bilaterally, respirations unlabored  Chest Wall:    No tenderness or deformity   Heart:    Regular rate and rhythm, S1 and S2 normal, no murmur, rub   or gallop  Breast Exam:    Deferred to GYN  Abdomen:     Soft, non-tender, bowel sounds active all four quadrants,    no masses, no organomegaly  Genitalia:    Deferred to GYN  Rectal:    Extremities:   Extremities normal, atraumatic, no cyanosis or edema  Pulses:   2+ and symmetric all  extremities  Skin:   Skin color, texture, turgor normal, no rashes or lesions  Lymph nodes:   Cervical, supraclavicular, and axillary nodes normal  Neurologic:   CNII-XII intact, normal strength, sensation and reflexes    throughout          Assessment & Plan:

## 2016-06-02 NOTE — Assessment & Plan Note (Signed)
Pt's PE WNL.  Pt to return for pap and breast exam.  Applauded her recent weight loss.  Check labs.  Anticipatory guidance provided.

## 2016-06-02 NOTE — Patient Instructions (Signed)
Follow up in 6 months to recheck BP, cholesterol and do your pap We'll notify you of your lab results and make any changes if needed Continue to work on healthy diet and regular exercise- you look great! Schedule an appt w/ [facility] and see if the headaches improve.  If not, let me know and we'll get you back to see Dr Tomi Likens Call with any questions or concerns Enjoy the rest of your summer!!!

## 2016-06-03 ENCOUNTER — Encounter: Payer: Self-pay | Admitting: General Practice

## 2016-06-03 LAB — HEPATIC FUNCTION PANEL
ALT: 13 IU/L (ref 0–32)
AST: 12 IU/L (ref 0–40)
Albumin: 4.6 g/dL (ref 3.5–5.5)
Alkaline Phosphatase: 67 IU/L (ref 39–117)
Bilirubin Total: 0.5 mg/dL (ref 0.0–1.2)
Bilirubin, Direct: 0.12 mg/dL (ref 0.00–0.40)
Total Protein: 6.4 g/dL (ref 6.0–8.5)

## 2016-06-03 LAB — CBC WITH DIFFERENTIAL/PLATELET
Basophils Absolute: 0 10*3/uL (ref 0.0–0.2)
Basos: 0 %
EOS (ABSOLUTE): 0.2 10*3/uL (ref 0.0–0.4)
Eos: 3 %
Hematocrit: 39.7 % (ref 34.0–46.6)
Hemoglobin: 13.1 g/dL (ref 11.1–15.9)
Immature Grans (Abs): 0 10*3/uL (ref 0.0–0.1)
Immature Granulocytes: 0 %
Lymphocytes Absolute: 2.3 10*3/uL (ref 0.7–3.1)
Lymphs: 38 %
MCH: 30.3 pg (ref 26.6–33.0)
MCHC: 33 g/dL (ref 31.5–35.7)
MCV: 92 fL (ref 79–97)
Monocytes Absolute: 0.4 10*3/uL (ref 0.1–0.9)
Monocytes: 7 %
Neutrophils Absolute: 3.2 10*3/uL (ref 1.4–7.0)
Neutrophils: 52 %
Platelets: 282 10*3/uL (ref 150–379)
RBC: 4.32 x10E6/uL (ref 3.77–5.28)
RDW: 13.6 % (ref 12.3–15.4)
WBC: 6 10*3/uL (ref 3.4–10.8)

## 2016-06-03 LAB — LIPID PANEL
Chol/HDL Ratio: 4.1 ratio units (ref 0.0–4.4)
Cholesterol, Total: 195 mg/dL (ref 100–199)
HDL: 47 mg/dL (ref >39)
LDL Calculated: 126 mg/dL — ABNORMAL HIGH (ref 0–99)
Triglycerides: 110 mg/dL (ref 0–149)
VLDL Cholesterol Cal: 22 mg/dL (ref 5–40)

## 2016-06-03 LAB — BASIC METABOLIC PANEL
BUN/Creatinine Ratio: 18 (ref 9–23)
BUN: 11 mg/dL (ref 6–20)
CO2: 23 mmol/L (ref 18–29)
Calcium: 9.3 mg/dL (ref 8.7–10.2)
Chloride: 102 mmol/L (ref 96–106)
Creatinine, Ser: 0.62 mg/dL (ref 0.57–1.00)
GFR calc Af Amer: 133 mL/min/{1.73_m2} (ref >59)
GFR calc non Af Amer: 116 mL/min/{1.73_m2} (ref >59)
Glucose: 89 mg/dL (ref 65–99)
Potassium: 4.1 mmol/L (ref 3.5–5.2)
Sodium: 142 mmol/L (ref 134–144)

## 2016-06-03 LAB — VITAMIN D 25 HYDROXY (VIT D DEFICIENCY, FRACTURES): Vit D, 25-Hydroxy: 29.3 ng/mL — ABNORMAL LOW (ref 30.0–100.0)

## 2016-06-03 LAB — TSH: TSH: 1.96 u[IU]/mL (ref 0.450–4.500)

## 2016-06-12 ENCOUNTER — Ambulatory Visit: Payer: 59 | Admitting: Neurology

## 2016-10-05 ENCOUNTER — Encounter: Payer: Self-pay | Admitting: Family Medicine

## 2016-10-05 ENCOUNTER — Ambulatory Visit (INDEPENDENT_AMBULATORY_CARE_PROVIDER_SITE_OTHER): Payer: 59 | Admitting: Family Medicine

## 2016-10-05 VITALS — BP 121/81 | HR 101 | Temp 98.3°F | Resp 17 | Ht 63.0 in | Wt 186.4 lb

## 2016-10-05 DIAGNOSIS — J0100 Acute maxillary sinusitis, unspecified: Secondary | ICD-10-CM

## 2016-10-05 MED ORDER — AMOXICILLIN 875 MG PO TABS: 875.0000 mg | ORAL_TABLET | Freq: Two times a day (BID) | ORAL | 0 refills | Status: DC

## 2016-10-05 NOTE — Progress Notes (Signed)
   Subjective:    Patient ID: Rose Bell, female    DOB: 1981-08-26, 35 y.o.   MRN: [id number]  HPI URI- sxs started last week w/ nasal congestion and by Friday, had to leave work early.  Bilateral ear pain.  + maxillary sinus pain.  No fever.  + tooth pain.  Minimal cough due to PND.  + sick contacts.   Review of Systems For ROS see HPI     Objective:   Physical Exam  Constitutional: She appears well-developed and well-nourished. No distress.  HENT:  Head: Normocephalic and atraumatic.  Right Ear: Tympanic membrane is retracted.  Left Ear: Tympanic membrane is retracted.  Nose: Mucosal edema and rhinorrhea present. Right sinus exhibits maxillary sinus tenderness. Right sinus exhibits no frontal sinus tenderness. Left sinus exhibits maxillary sinus tenderness. Left sinus exhibits no frontal sinus tenderness.  Mouth/Throat: Uvula is midline and mucous membranes are normal. Posterior oropharyngeal erythema present. No oropharyngeal exudate.  Eyes: Conjunctivae and EOM are normal. Pupils are equal, round, and reactive to light.  Neck: Normal range of motion. Neck supple.  Cardiovascular: Normal rate, regular rhythm and normal heart sounds.   Pulmonary/Chest: Effort normal and breath sounds normal. No respiratory distress. She has no wheezes.  Lymphadenopathy:    She has no cervical adenopathy.  Vitals reviewed.         Assessment & Plan:  Maxillary sinusitis- new.  Pt's sxs and PE consistent w/ infxn.  Start abx.  Reviewed supportive care and red flags that should prompt return.  Pt expressed understanding and is in agreement w/ plan.

## 2016-10-05 NOTE — Patient Instructions (Signed)
Follow up as needed Start the Amoxicillin twice daily- take w/ food Drink plenty of fluids REST! Ibuprofen for pain or fever Call with any questions or concerns Merry Christmas!!!

## 2016-10-05 NOTE — Progress Notes (Signed)
Pre visit review using our clinic review tool, if applicable. No additional management support is needed unless otherwise documented below in the visit note. 

## 2016-11-30 ENCOUNTER — Encounter: Payer: Self-pay | Admitting: Family Medicine

## 2016-11-30 ENCOUNTER — Ambulatory Visit (INDEPENDENT_AMBULATORY_CARE_PROVIDER_SITE_OTHER): Payer: 59 | Admitting: Family Medicine

## 2016-11-30 ENCOUNTER — Other Ambulatory Visit (HOSPITAL_COMMUNITY)
Admission: RE | Admit: 2016-11-30 | Discharge: 2016-11-30 | Disposition: A | Discharge status: Home / Self Care | Payer: 59 | Source: Ambulatory Visit | Attending: Family Medicine | Admitting: Family Medicine

## 2016-11-30 VITALS — BP 138/86 | HR 65 | Temp 98.3°F | Resp 16 | Ht 63.0 in | Wt 188.1 lb

## 2016-11-30 DIAGNOSIS — Z113 Encounter for screening for infections with a predominantly sexual mode of transmission: Secondary | ICD-10-CM | POA: Diagnosis present

## 2016-11-30 DIAGNOSIS — E785 Hyperlipidemia, unspecified: Secondary | ICD-10-CM

## 2016-11-30 DIAGNOSIS — Z01419 Encounter for gynecological examination (general) (routine) without abnormal findings: Secondary | ICD-10-CM | POA: Diagnosis present

## 2016-11-30 DIAGNOSIS — Z1151 Encounter for screening for human papillomavirus (HPV): Secondary | ICD-10-CM | POA: Diagnosis present

## 2016-11-30 DIAGNOSIS — R102 Pelvic and perineal pain: Secondary | ICD-10-CM | POA: Diagnosis not present

## 2016-11-30 DIAGNOSIS — I10 Essential (primary) hypertension: Secondary | ICD-10-CM

## 2016-11-30 LAB — POCT URINALYSIS DIPSTICK
Bilirubin, UA: NEGATIVE
Blood, UA: NEGATIVE
Glucose, UA: NEGATIVE
Ketones, UA: NEGATIVE
Leukocytes, UA: NEGATIVE
Nitrite, UA: NEGATIVE
Protein, UA: NEGATIVE
Spec Grav, UA: 1.01
Urobilinogen, UA: 0.2
pH, UA: 7

## 2016-11-30 NOTE — Assessment & Plan Note (Signed)
Chronic problem.  Not taking statin.  Check labs and restart meds prn.  Stressed need for healthy diet and regular exercise.  Pt expressed understanding and is in agreement w/ plan.

## 2016-11-30 NOTE — Assessment & Plan Note (Signed)
New to provider.  Pt reports sxs started a few weeks ago.  Pain w/ intercourse and pain w/ palpation of lower abdomen and pelvis.  No CMT on exam, pain w/ palpation of uterus and insertion of speculum.  Check for PID and get pelvic US.  Reviewed supportive care and red flags that should prompt return.  Pt expressed understanding and is in agreement w/ plan.

## 2016-11-30 NOTE — Progress Notes (Signed)
Pre visit review using our clinic review tool, if applicable. No additional management support is needed unless otherwise documented below in the visit note. 

## 2016-11-30 NOTE — Patient Instructions (Signed)
Follow up in 6 months to recheck BP and cholesterol We'll notify you of your lab results and make any changes if needed We'll call you with your ultrasound appt and results Continue to work on healthy diet and regular exercise- you can do it! Call with any questions or concerns Hang in there!!!

## 2016-11-30 NOTE — Assessment & Plan Note (Signed)
Chronic problem.  Pt is not currently taking meds and while BP is elevated, it's not high enough to warrant starting meds at this time.  Asymptomatic at this time.  Will follow.

## 2016-11-30 NOTE — Progress Notes (Signed)
   Subjective:    Patient ID: Rose Bell, female    DOB: 09-24-1981, 36 y.o.   MRN: [id number]  HPI HTN- ongoing issue for pt.  She is not taking HCTZ daily as directed.  BP is above goal but not hypertensive today.  No CP, SOB, HAs, visual changes, edema.  Pt admits to being flustered today.  Hyperlipidemia- chronic problem, not taking Lipitor.  Not exercising regularly- has gained 2 lbs.  Pap- pt having pain during intercourse.  Pt feels pain is more gas and bloating related vs 'lady parts'.  Has similar discomfort w/ sitting or walking.  Denies changes to bowel or bladder habits.  sxs started 'a few weeks' ago but are worsening.  Denies belching or reflux- some increased gas, which can be painful.  No fevers.   Review of Systems For ROS see HPI     Objective:   Physical Exam  Constitutional: She is oriented to person, place, and time. She appears well-developed and well-nourished. No distress.  HENT:  Head: Normocephalic and atraumatic.  Eyes: Conjunctivae and EOM are normal. Pupils are equal, round, and reactive to light.  Neck: Normal range of motion. Neck supple. No thyromegaly present.  Cardiovascular: Normal rate, regular rhythm, normal heart sounds and intact distal pulses.   No murmur heard. Pulmonary/Chest: Effort normal and breath sounds normal. No respiratory distress.  Abdominal: Soft. She exhibits no distension. There is tenderness (TTP over pelvis). There is guarding (voluntary guarding). There is no rebound.  Genitourinary: There is no rash, tenderness, lesion or injury on the right labia. There is no rash, tenderness, lesion or injury on the left labia. Uterus is tender. Uterus is not deviated, not enlarged and not fixed. Cervix exhibits no motion tenderness, no discharge and no friability. Right adnexum displays no mass, no tenderness and no fullness. Left adnexum displays no mass, no tenderness and no fullness. There is tenderness (pain w/ insertion of speculum) in the  vagina. No bleeding in the vagina. No foreign body in the vagina. No vaginal discharge found.  Musculoskeletal: She exhibits no edema.  Lymphadenopathy:    She has no cervical adenopathy.  Neurological: She is alert and oriented to person, place, and time.  Skin: Skin is warm and dry.  Psychiatric: She has a normal mood and affect. Her behavior is normal.          Assessment & Plan:

## 2016-12-01 ENCOUNTER — Telehealth: Payer: Self-pay | Admitting: Family Medicine

## 2016-12-01 LAB — CBC WITH DIFFERENTIAL/PLATELET
Basophils Absolute: 0 10*3/uL (ref 0.0–0.2)
Basos: 0 %
EOS (ABSOLUTE): 0.1 10*3/uL (ref 0.0–0.4)
Eos: 2 %
Hematocrit: 38.2 % (ref 34.0–46.6)
Hemoglobin: 12.7 g/dL (ref 11.1–15.9)
Immature Grans (Abs): 0 10*3/uL (ref 0.0–0.1)
Immature Granulocytes: 0 %
Lymphocytes Absolute: 2.1 10*3/uL (ref 0.7–3.1)
Lymphs: 35 %
MCH: 29.7 pg (ref 26.6–33.0)
MCHC: 33.2 g/dL (ref 31.5–35.7)
MCV: 90 fL (ref 79–97)
Monocytes Absolute: 0.5 10*3/uL (ref 0.1–0.9)
Monocytes: 8 %
Neutrophils Absolute: 3.4 10*3/uL (ref 1.4–7.0)
Neutrophils: 55 %
Platelets: 265 10*3/uL (ref 150–379)
RBC: 4.27 x10E6/uL (ref 3.77–5.28)
RDW: 13.7 % (ref 12.3–15.4)
WBC: 6.1 10*3/uL (ref 3.4–10.8)

## 2016-12-01 LAB — LIPID PANEL
Chol/HDL Ratio: 5.9 ratio units — ABNORMAL HIGH (ref 0.0–4.4)
Cholesterol, Total: 258 mg/dL — ABNORMAL HIGH (ref 100–199)
HDL: 44 mg/dL (ref >39)
LDL Calculated: 174 mg/dL — ABNORMAL HIGH (ref 0–99)
Triglycerides: 199 mg/dL — ABNORMAL HIGH (ref 0–149)
VLDL Cholesterol Cal: 40 mg/dL (ref 5–40)

## 2016-12-01 LAB — CYTOLOGY - PAP
Bacterial vaginitis: POSITIVE — AB
Candida vaginitis: NEGATIVE
Chlamydia: NEGATIVE
Diagnosis: NEGATIVE
HPV: NOT DETECTED
Neisseria Gonorrhea: NEGATIVE
Trichomonas: NEGATIVE

## 2016-12-01 LAB — BASIC METABOLIC PANEL
BUN/Creatinine Ratio: 10 (ref 9–23)
BUN: 7 mg/dL (ref 6–20)
CO2: 22 mmol/L (ref 18–29)
Calcium: 9 mg/dL (ref 8.7–10.2)
Chloride: 104 mmol/L (ref 96–106)
Creatinine, Ser: 0.67 mg/dL (ref 0.57–1.00)
GFR calc Af Amer: 130 mL/min/{1.73_m2} (ref >59)
GFR calc non Af Amer: 113 mL/min/{1.73_m2} (ref >59)
Glucose: 79 mg/dL (ref 65–99)
Potassium: 4 mmol/L (ref 3.5–5.2)
Sodium: 142 mmol/L (ref 134–144)

## 2016-12-01 LAB — HEPATIC FUNCTION PANEL
ALT: 9 IU/L (ref 0–32)
AST: 15 IU/L (ref 0–40)
Albumin: 4.3 g/dL (ref 3.5–5.5)
Alkaline Phosphatase: 69 IU/L (ref 39–117)
Bilirubin Total: <0.2 mg/dL (ref 0.0–1.2)
Bilirubin, Direct: 0.07 mg/dL (ref 0.00–0.40)
Total Protein: 6.2 g/dL (ref 6.0–8.5)

## 2016-12-01 LAB — TSH: TSH: 2.71 u[IU]/mL (ref 0.450–4.500)

## 2016-12-01 NOTE — Telephone Encounter (Signed)
Pt results documented in result note.

## 2016-12-01 NOTE — Telephone Encounter (Signed)
Patient returning phone call about results. Please call back as soon as possible.  Thank you

## 2016-12-01 NOTE — Telephone Encounter (Signed)
Patient returning call about results Please call patient back.   Thank you.

## 2016-12-02 ENCOUNTER — Other Ambulatory Visit: Payer: Self-pay | Admitting: *Deleted

## 2016-12-02 ENCOUNTER — Encounter: Payer: Self-pay | Admitting: Family Medicine

## 2016-12-02 ENCOUNTER — Telehealth: Payer: Self-pay | Admitting: Family Medicine

## 2016-12-02 DIAGNOSIS — N760 Acute vaginitis: Secondary | ICD-10-CM

## 2016-12-02 DIAGNOSIS — E785 Hyperlipidemia, unspecified: Secondary | ICD-10-CM

## 2016-12-02 DIAGNOSIS — B9689 Other specified bacterial agents as the cause of diseases classified elsewhere: Secondary | ICD-10-CM

## 2016-12-02 DIAGNOSIS — R102 Pelvic and perineal pain: Secondary | ICD-10-CM

## 2016-12-02 MED ORDER — METRONIDAZOLE 500 MG PO TABS: 500.0000 mg | ORAL_TABLET | Freq: Two times a day (BID) | ORAL | 0 refills | Status: DC

## 2016-12-02 MED ORDER — METRONIDAZOLE 500 MG PO TABS: 500.0000 mg | ORAL_TABLET | Freq: Two times a day (BID) | ORAL | 0 refills | Status: AC

## 2016-12-02 NOTE — Telephone Encounter (Signed)
bro

## 2016-12-02 NOTE — Telephone Encounter (Signed)
Disregard per pharmacy.

## 2016-12-02 NOTE — Telephone Encounter (Signed)
Were you able to contact pt yesterday?

## 2016-12-03 ENCOUNTER — Telehealth: Payer: Self-pay | Admitting: Family Medicine

## 2016-12-03 NOTE — Telephone Encounter (Signed)
Patient called to let Birdie Riddle know the antibiotic she prescribed for a bacteria is something that her husband should get treated for as well? Also, she would like to know if the bacteria is something that her body naturally formed or if her husband contributed to it from "somewhere else."   You are able to leave a detailed message on patient's phone, it is a secure line.   Thank you.

## 2016-12-03 NOTE — Telephone Encounter (Signed)
This is a normal bacterial overgrowth and did not come from an outside source.  No need for husband to be treated.

## 2016-12-03 NOTE — Telephone Encounter (Signed)
Called pt and left a detailed message on voicemail.

## 2016-12-09 ENCOUNTER — Encounter: Payer: Self-pay | Admitting: Family Medicine

## 2016-12-09 ENCOUNTER — Other Ambulatory Visit: Payer: Self-pay | Admitting: Family Medicine

## 2016-12-09 DIAGNOSIS — R102 Pelvic and perineal pain: Secondary | ICD-10-CM

## 2016-12-10 ENCOUNTER — Ambulatory Visit (HOSPITAL_COMMUNITY): Payer: 59

## 2017-01-27 ENCOUNTER — Other Ambulatory Visit: Payer: 59

## 2017-02-05 ENCOUNTER — Encounter: Payer: Self-pay | Admitting: Family Medicine

## 2017-02-12 ENCOUNTER — Other Ambulatory Visit (INDEPENDENT_AMBULATORY_CARE_PROVIDER_SITE_OTHER): Payer: 59

## 2017-02-12 DIAGNOSIS — E7800 Pure hypercholesterolemia, unspecified: Secondary | ICD-10-CM

## 2017-02-13 LAB — SPECIMEN STATUS

## 2017-02-16 LAB — LIPID PANEL
Chol/HDL Ratio: 5.7 ratio — ABNORMAL HIGH (ref 0.0–4.4)
Cholesterol, Total: 260 mg/dL — ABNORMAL HIGH (ref 100–199)
HDL: 46 mg/dL (ref >39)
LDL Calculated: 179 mg/dL — ABNORMAL HIGH (ref 0–99)
Triglycerides: 177 mg/dL — ABNORMAL HIGH (ref 0–149)
VLDL Cholesterol Cal: 35 mg/dL (ref 5–40)

## 2017-02-16 LAB — HEPATIC FUNCTION PANEL
ALT: 10 IU/L (ref 0–32)
AST: 13 IU/L (ref 0–40)
Albumin: 4.2 g/dL (ref 3.5–5.5)
Alkaline Phosphatase: 66 IU/L (ref 39–117)
Bilirubin Total: 0.4 mg/dL (ref 0.0–1.2)
Bilirubin, Direct: 0.1 mg/dL (ref 0.00–0.40)
Total Protein: 6.6 g/dL (ref 6.0–8.5)

## 2017-03-09 IMAGING — CT CT HEAD W/O CM
1 series · 16 of 30 positions shown, 20 images · non-contrast
Comparison: None.

CLINICAL DATA: Pain in top of head for several days. Elevated blood
pressure.

EXAM:
CT HEAD WITHOUT CONTRAST
TECHNIQUE: Contiguous axial images were obtained from the base of the skull
through the vertex without intravenous contrast.

[Series 2: head wo · axial · 0.44mm/px · z∈[+1212,+1357]mm · 16 of 33 slices shown, 20 images]
[im 2/33  brain]
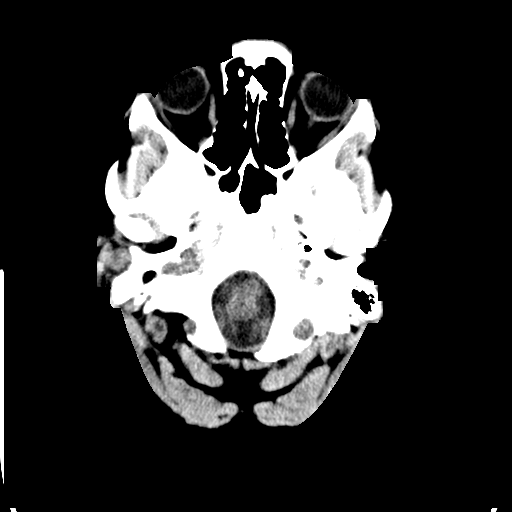
[im 2/33  bone]
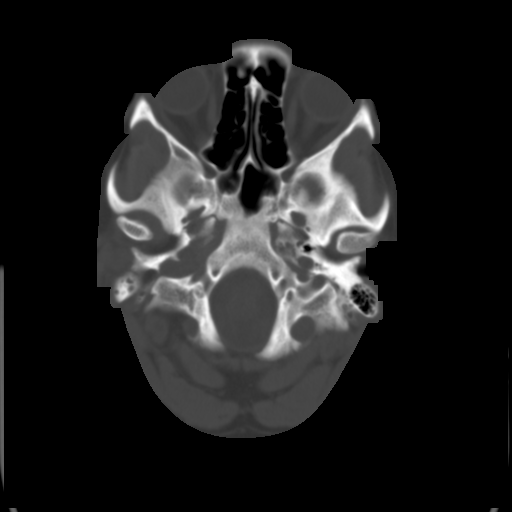
[im 4/33  brain]
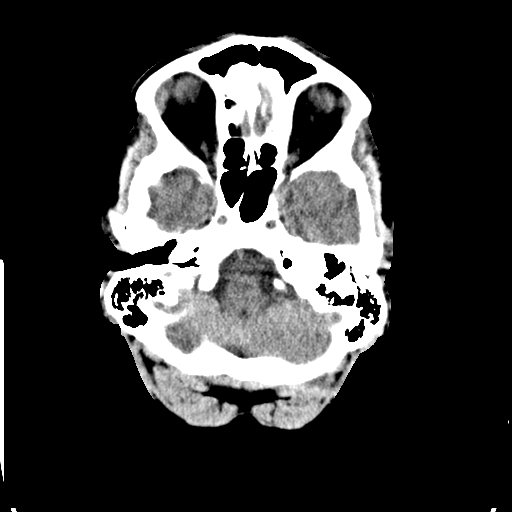
[im 6/33  brain]
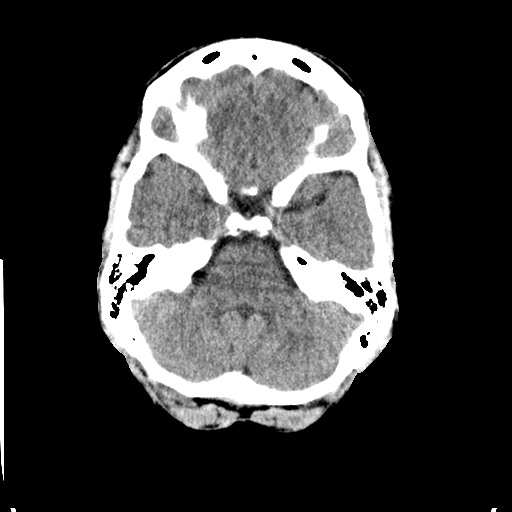
[im 8/33  brain]
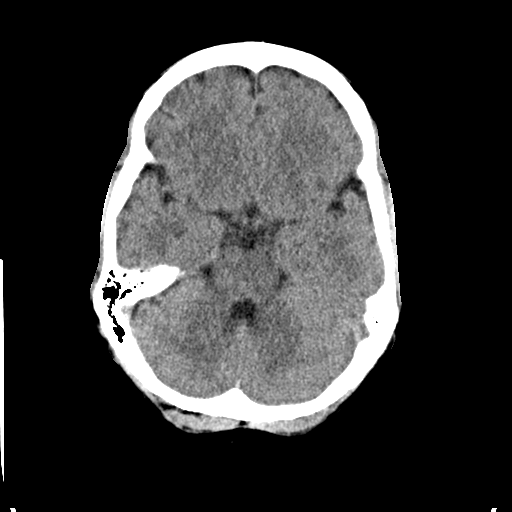
[im 9/33  brain]
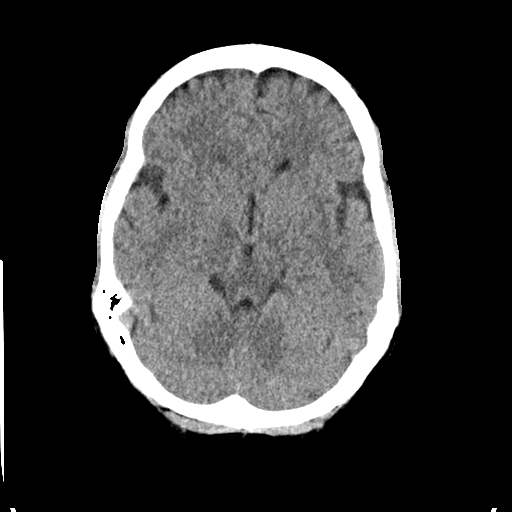
[im 9/33  bone]
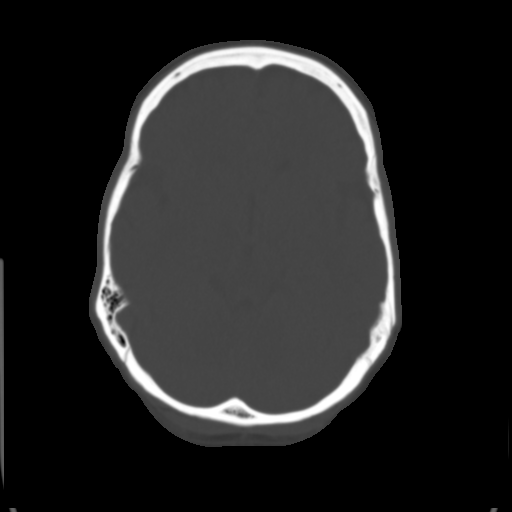
[im 12/33  brain]
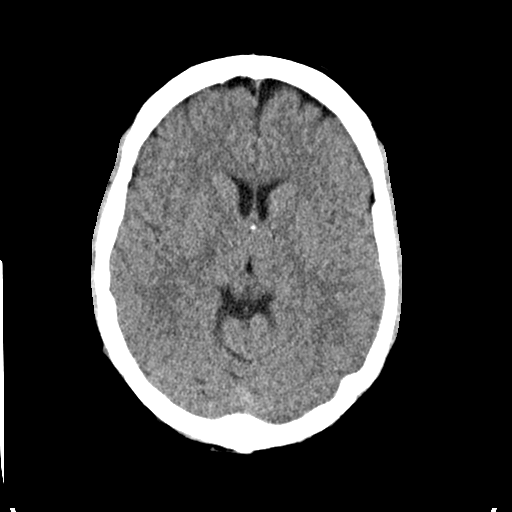
[im 14/33  brain]
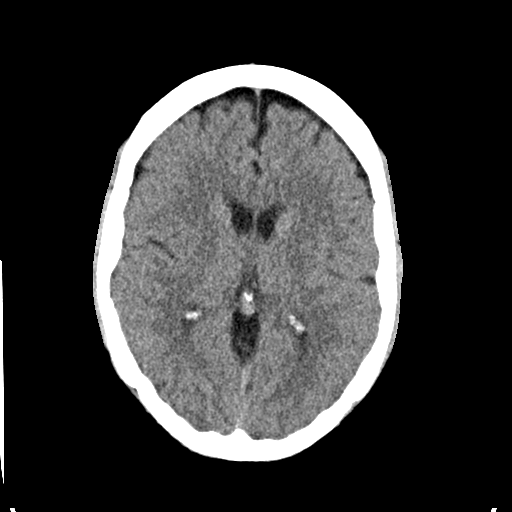
[im 16/33  brain]
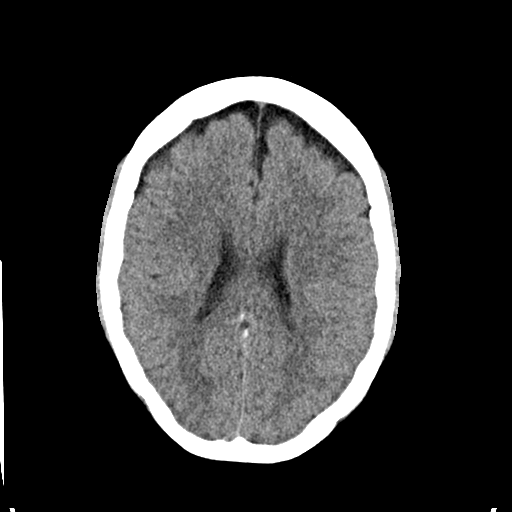
[im 17/33  brain]
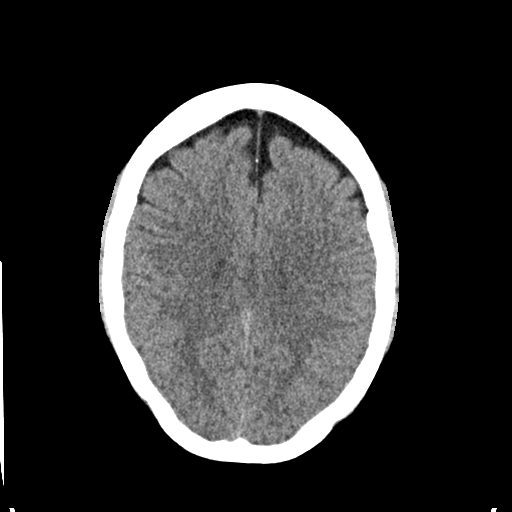
[im 17/33  bone]
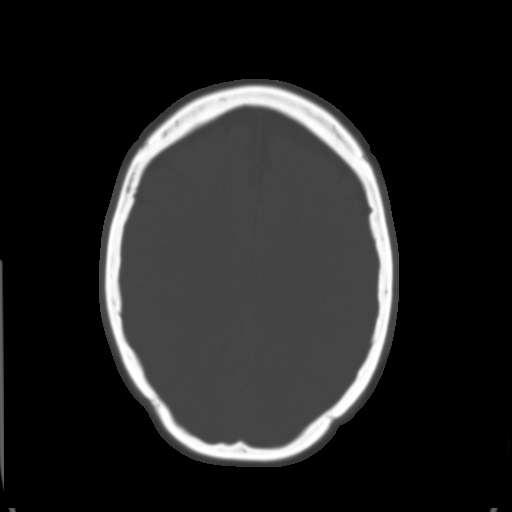
[im 19/33  brain]
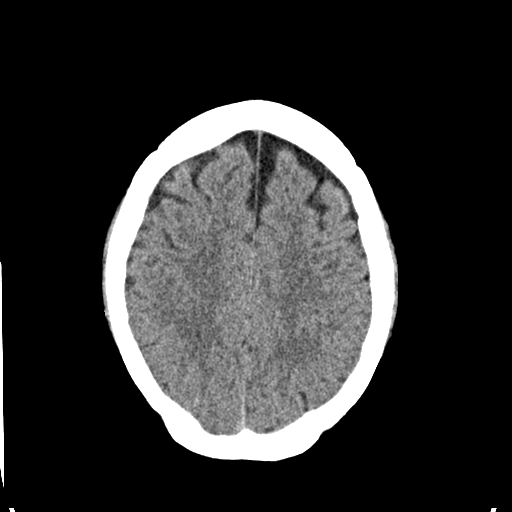
[im 21/33  brain]
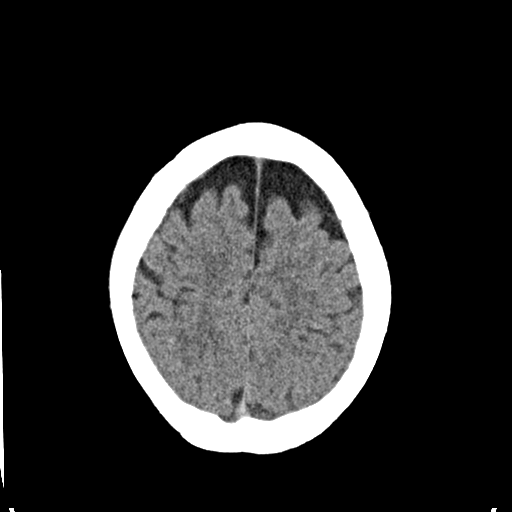
[im 24/33  brain]
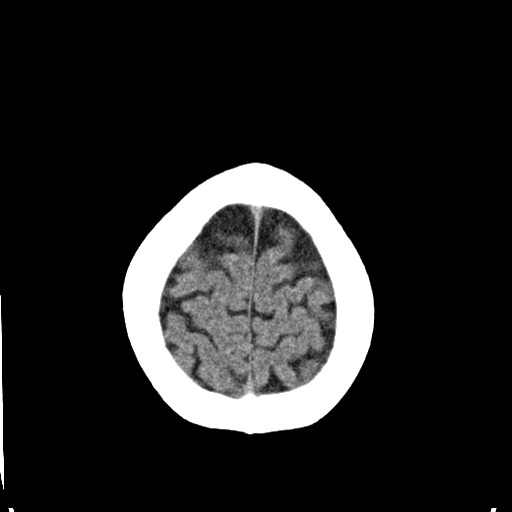
[im 25/33  brain]
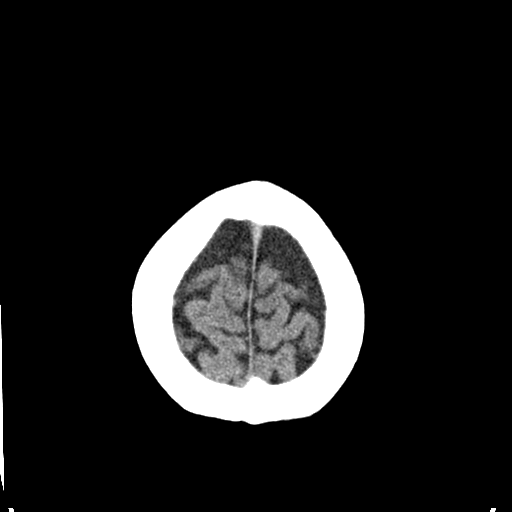
[im 25/33  bone]
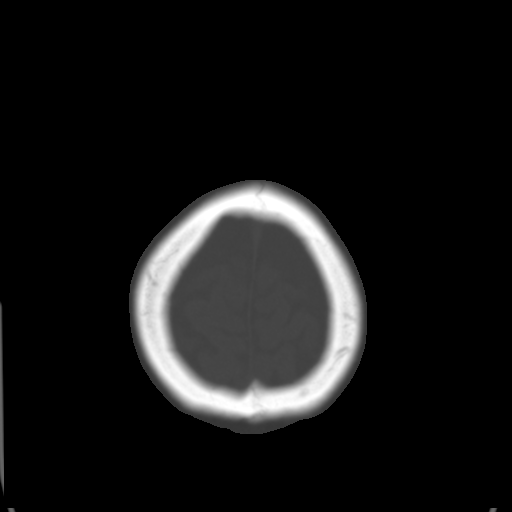
[im 27/33  brain]
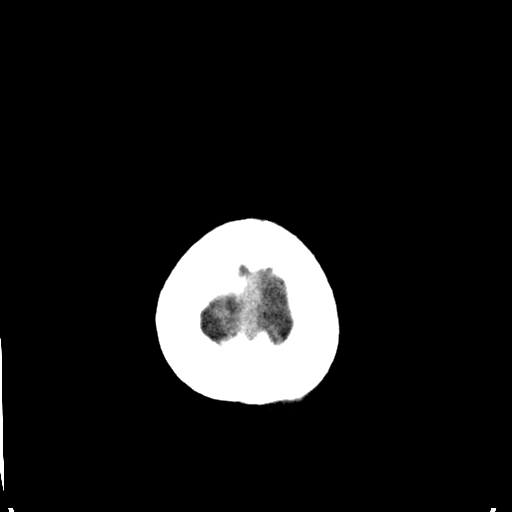
[im 29/33  brain]
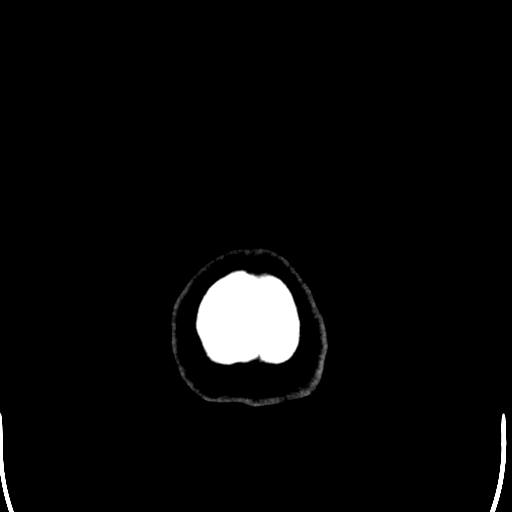
[im 31/33  brain]
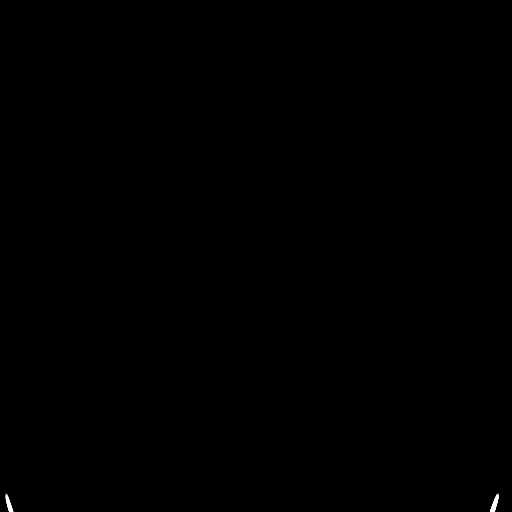

[16 of 30 positions shown; findings below may reference images not displayed]

FINDINGS: No evidence for acute infarction, hemorrhage, mass lesion,
hydrocephalus, or extra-axial fluid. Mild BILATERAL frontal lobe
volume loss, nonspecific, with prominence of the extracerebral CSF
spaces over the frontal lobes. No signs of previous cortical
infarction. No white matter disease of significance. Calvarium
intact. No layering fluid in the sinuses or mastoids.
IMPRESSION: Query premature frontal atrophy for age. No acute intracranial
findings.

## 2017-05-06 ENCOUNTER — Encounter: Payer: Self-pay | Admitting: Family Medicine

## 2017-05-10 MED ORDER — ATORVASTATIN CALCIUM 40 MG PO TABS: ORAL_TABLET | ORAL | 1 refills | Status: DC

## 2017-05-11 MED FILL — ATORVASTATIN 40 MG TABLET: 40 | 90 days supply | Qty: 90 | Fill #0

## 2017-05-31 ENCOUNTER — Ambulatory Visit (INDEPENDENT_AMBULATORY_CARE_PROVIDER_SITE_OTHER): Payer: 59 | Admitting: Family Medicine

## 2017-05-31 ENCOUNTER — Encounter: Payer: Self-pay | Admitting: General Practice

## 2017-05-31 ENCOUNTER — Encounter: Payer: Self-pay | Admitting: Family Medicine

## 2017-05-31 VITALS — BP 124/84 | HR 76 | Temp 98.4°F | Resp 16 | Ht 63.0 in | Wt 190.5 lb

## 2017-05-31 DIAGNOSIS — E785 Hyperlipidemia, unspecified: Secondary | ICD-10-CM

## 2017-05-31 DIAGNOSIS — I10 Essential (primary) hypertension: Secondary | ICD-10-CM

## 2017-05-31 LAB — HEPATIC FUNCTION PANEL
ALT: 13 U/L (ref 0–35)
AST: 14 U/L (ref 0–37)
Albumin: 4.3 g/dL (ref 3.5–5.2)
Alkaline Phosphatase: 70 U/L (ref 39–117)
Bilirubin, Direct: 0.1 mg/dL (ref 0.0–0.3)
Total Bilirubin: 0.3 mg/dL (ref 0.2–1.2)
Total Protein: 6.2 g/dL (ref 6.0–8.3)

## 2017-05-31 LAB — CBC WITH DIFFERENTIAL/PLATELET
Basophils Absolute: 0 10*3/uL (ref 0.0–0.1)
Basophils Relative: 0.5 % (ref 0.0–3.0)
Eosinophils Absolute: 0.2 10*3/uL (ref 0.0–0.7)
Eosinophils Relative: 2.7 % (ref 0.0–5.0)
HCT: 40.1 % (ref 36.0–46.0)
Hemoglobin: 13.2 g/dL (ref 12.0–15.0)
Lymphocytes Relative: 34.8 % (ref 12.0–46.0)
Lymphs Abs: 2.2 10*3/uL (ref 0.7–4.0)
MCHC: 32.9 g/dL (ref 30.0–36.0)
MCV: 90.9 fl (ref 78.0–100.0)
Monocytes Absolute: 0.4 10*3/uL (ref 0.1–1.0)
Monocytes Relative: 6.3 % (ref 3.0–12.0)
Neutro Abs: 3.5 10*3/uL (ref 1.4–7.7)
Neutrophils Relative %: 55.7 % (ref 43.0–77.0)
Platelets: 282 10*3/uL (ref 150.0–400.0)
RBC: 4.41 Mil/uL (ref 3.87–5.11)
RDW: 13.3 % (ref 11.5–15.5)
WBC: 6.3 10*3/uL (ref 4.0–10.5)

## 2017-05-31 LAB — BASIC METABOLIC PANEL
BUN: 10 mg/dL (ref 6–23)
CO2: 26 mEq/L (ref 19–32)
Calcium: 9.2 mg/dL (ref 8.4–10.5)
Chloride: 106 mEq/L (ref 96–112)
Creatinine, Ser: 0.66 mg/dL (ref 0.40–1.20)
GFR: 106.47 mL/min (ref >60.00)
Glucose, Bld: 103 mg/dL — ABNORMAL HIGH (ref 70–99)
Potassium: 4.2 mEq/L (ref 3.5–5.1)
Sodium: 140 mEq/L (ref 135–145)

## 2017-05-31 LAB — LIPID PANEL
Cholesterol: 179 mg/dL (ref 0–200)
HDL: 46.8 mg/dL (ref >39.00)
LDL Cholesterol: 119 mg/dL — ABNORMAL HIGH (ref 0–99)
NonHDL: 131.82
Total CHOL/HDL Ratio: 4
Triglycerides: 62 mg/dL (ref 0.0–149.0)
VLDL: 12.4 mg/dL (ref 0.0–40.0)

## 2017-05-31 LAB — TSH: TSH: 2.65 u[IU]/mL (ref 0.35–4.50)

## 2017-05-31 NOTE — Patient Instructions (Signed)
Schedule your complete physical in 6 months We'll notify you of your lab results and make any changes if needed Continue to work on healthy diet and regular exercise- you can do it!!! Call with any questions or concerns Hang in there!  You're doing great!!!

## 2017-05-31 NOTE — Assessment & Plan Note (Signed)
Chronic problem.  Now controlled w/ healthy diet and regular exercise- no medication at this time.  Asymptomatic.  Check labs.  Will follow.

## 2017-05-31 NOTE — Progress Notes (Signed)
   Subjective:    Patient ID: Graceann Congress, female    DOB: April 09, 1981, 36 y.o.   MRN: [id number]  HPI HTN- pt was previously on HCTZ daily but not currently taking.  BP remains adequately controlled.  Denies CP, SOB, HAs, visual changes, edema.  Hyperlipidemia- chronic problem, on Lipitor daily.  Denies abd pain, N/V, myalgias.   Review of Systems For ROS see HPI     Objective:   Physical Exam  Constitutional: She is oriented to person, place, and time. She appears well-developed and well-nourished. No distress.  obese  HENT:  Head: Normocephalic and atraumatic.  Eyes: Pupils are equal, round, and reactive to light. Conjunctivae and EOM are normal.  Neck: Normal range of motion. Neck supple. No thyromegaly present.  Cardiovascular: Normal rate, regular rhythm, normal heart sounds and intact distal pulses.   No murmur heard. Pulmonary/Chest: Effort normal and breath sounds normal. No respiratory distress.  Abdominal: Soft. She exhibits no distension. There is no tenderness.  Musculoskeletal: She exhibits no edema.  Lymphadenopathy:    She has no cervical adenopathy.  Neurological: She is alert and oriented to person, place, and time.  Skin: Skin is warm and dry.  Psychiatric: She has a normal mood and affect. Her behavior is normal.  Vitals reviewed.         Assessment & Plan:

## 2017-05-31 NOTE — Assessment & Plan Note (Signed)
Chronic problem.  Tolerating statin w/o difficulty.  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  Applauded her efforts.  Check labs.  Adjust meds prn

## 2017-05-31 NOTE — Progress Notes (Signed)
Pre visit review using our clinic review tool, if applicable. No additional management support is needed unless otherwise documented below in the visit note. 

## 2017-06-04 ENCOUNTER — Encounter: Payer: Self-pay | Admitting: Family Medicine

## 2017-07-08 ENCOUNTER — Encounter: Payer: Self-pay | Admitting: Family Medicine

## 2017-07-09 MED ORDER — BUPROPION HCL ER (XL) 150 MG PO TB24: 150.0000 mg | ORAL_TABLET | Freq: Every day | ORAL | 3 refills | Status: DC

## 2017-07-09 MED ORDER — SERTRALINE HCL 50 MG PO TABS: ORAL_TABLET | ORAL | 3 refills | Status: DC

## 2017-07-09 MED FILL — SERTRALINE HCL 50 MG TABLET: 50 | 30 days supply | Qty: 30 | Fill #0

## 2017-07-09 NOTE — Addendum Note (Signed)
Addended by: Midge Minium on: 07/09/2017 02:08 PM   Modules accepted: Orders

## 2017-08-10 MED FILL — SERTRALINE HCL 50 MG TABLET: 50 | 30 days supply | Qty: 30 | Fill #1

## 2017-09-07 MED FILL — ATORVASTATIN 40 MG TABLET: 40 | 90 days supply | Qty: 90 | Fill #1

## 2017-09-15 MED FILL — SERTRALINE HCL 50 MG TABLET: 50 | 30 days supply | Qty: 30 | Fill #2

## 2017-11-01 MED FILL — SERTRALINE HCL 50 MG TABLET: 50 | 30 days supply | Qty: 30 | Fill #3

## 2017-11-17 ENCOUNTER — Ambulatory Visit: Payer: Self-pay | Admitting: Emergency Medicine

## 2017-11-17 VITALS — BP 130/85 | HR 78 | Temp 98.8°F | Resp 16 | Wt 194.0 lb

## 2017-11-17 DIAGNOSIS — H6121 Impacted cerumen, right ear: Secondary | ICD-10-CM

## 2017-11-17 DIAGNOSIS — R0981 Nasal congestion: Secondary | ICD-10-CM

## 2017-11-17 DIAGNOSIS — H9202 Otalgia, left ear: Secondary | ICD-10-CM

## 2017-11-17 MED ORDER — MONTELUKAST SODIUM 10 MG PO TABS: 10.0000 mg | ORAL_TABLET | Freq: Every day | ORAL | 3 refills | Status: DC

## 2017-11-17 MED FILL — MONTELUKAST SOD 10 MG TAB: 10 | 30 days supply | Qty: 30 | Fill #0

## 2017-11-17 NOTE — Patient Instructions (Signed)
Earache, Adult An earache, or ear pain, can be caused by many things, including:  An infection.  Ear wax buildup.  Ear pressure.  Something in the ear that should not be there (foreign body).  A sore throat.  Tooth problems.  Jaw problems.  Treatment of the earache will depend on the cause. If the cause is not clear or cannot be determined, you may need to watch your symptoms until your earache goes away or until a cause is found. Follow these instructions at home: Pay attention to any changes in your symptoms. Take these actions to help with your pain:  Take or apply over-the-counter and prescription medicines only as told by your health care provider.  If you were prescribed an antibiotic medicine, use it as told by your health care provider. Do not stop using the antibiotic even if you start to feel better.  Do not put anything in your ear other than medicine that is prescribed by your health care provider.  If directed, apply heat to the affected area as often as told by your health care provider. Use the heat source that your health care provider recommends, such as a moist heat pack or a heating pad. ? Place a towel between your skin and the heat source. ? Leave the heat on for 20-30 minutes. ? Remove the heat if your skin turns bright red. This is especially important if you are unable to feel pain, heat, or cold. You may have a greater risk of getting burned.  If directed, put ice on the ear: ? Put ice in a plastic bag. ? Place a towel between your skin and the bag. ? Leave the ice on for 20 minutes, 2-3 times a day.  Try resting in an upright position instead of lying down. This may help to reduce pressure in your ear and relieve pain.  Chew gum if it helps to relieve your ear pain.  Treat any allergies as told by your health care provider.  Keep all follow-up visits as told by your health care provider. This is important.  Contact a health care provider  if:  Your pain does not improve within 2 days.  Your earache gets worse.  You have new symptoms.  You have a fever. Get help right away if:  You have a severe headache.  You have a stiff neck.  You have trouble swallowing.  You have redness or swelling behind your ear.  You have fluid or blood coming from your ear.  You have hearing loss.  You feel dizzy. This information is not intended to replace advice given to you by your health care provider. Make sure you discuss any questions you have with your health care provider. Document Released: 05/22/2004 Document Revised: 06/02/2016 Document Reviewed: 03/30/2016 Elsevier Interactive Patient Education  2018 [employer]. Allergic Rhinitis, Adult Allergic rhinitis is an allergic reaction that affects the mucous membrane inside the nose. It causes sneezing, a runny or stuffy nose, and the feeling of mucus going down the back of the throat (postnasal drip). Allergic rhinitis can be mild to severe. There are two types of allergic rhinitis:  Seasonal. This type is also called hay fever. It happens only during certain seasons.  Perennial. This type can happen at any time of the year.  What are the causes? This condition happens when the body's defense system (immune system) responds to certain harmless substances called allergens as though they were germs.  Seasonal allergic rhinitis is triggered by pollen,  which can come from grasses, trees, and weeds. Perennial allergic rhinitis may be caused by:  House dust mites.  Pet dander.  Mold spores.  What are the signs or symptoms? Symptoms of this condition include:  Sneezing.  Runny or stuffy nose (nasal congestion).  Postnasal drip.  Itchy nose.  Tearing of the eyes.  Trouble sleeping.  Daytime sleepiness.  How is this diagnosed? This condition may be diagnosed based on:  Your medical history.  A physical exam.  Tests to check for related conditions, such  as: ? Asthma. ? Pink eye. ? Ear infection. ? Upper respiratory infection.  Tests to find out which allergens trigger your symptoms. These may include skin or blood tests.  How is this treated? There is no cure for this condition, but treatment can help control symptoms. Treatment may include:  Taking medicines that block allergy symptoms, such as antihistamines. Medicine may be given as a shot, nasal spray, or pill.  Avoiding the allergen.  Desensitization. This treatment involves getting ongoing shots until your body becomes less sensitive to the allergen. This treatment may be done if other treatments do not help.  If taking medicine and avoiding the allergen does not work, new, stronger medicines may be prescribed.  Follow these instructions at home:  Find out what you are allergic to. Common allergens include smoke, dust, and pollen.  Avoid the things you are allergic to. These are some things you can do to help avoid allergens: ? Replace carpet with wood, tile, or vinyl flooring. Carpet can trap dander and dust. ? Do not smoke. Do not allow smoking in your home. ? Change your heating and air conditioning filter at least once a month. ? During allergy season:  Keep windows closed as much as possible.  Plan outdoor activities when pollen counts are lowest. This is usually during the evening hours.  When coming indoors, change clothing and shower before sitting on furniture or bedding.  Take over-the-counter and prescription medicines only as told by your health care provider.  Keep all follow-up visits as told by your health care provider. This is important. Contact a health care provider if:  You have a fever.  You develop a persistent cough.  You make whistling sounds when you breathe (you wheeze).  Your symptoms interfere with your normal daily activities. Get help right away if:  You have shortness of breath. Summary  This condition can be managed by taking  medicines as directed and avoiding allergens.  Contact your health care provider if you develop a persistent cough or fever.  During allergy season, keep windows closed as much as possible. This information is not intended to replace advice given to you by your health care provider. Make sure you discuss any questions you have with your health care provider. Document Released: 06/30/2001 Document Revised: 11/12/2016 Document Reviewed: 11/12/2016 Elsevier Interactive Patient Education  2018 [place], Adult The ears produce a substance called earwax that helps keep bacteria out of the ear and protects the skin in the ear canal. Occasionally, earwax can build up in the ear and cause discomfort or hearing loss. What increases the risk? This condition is more likely to develop in people who:  Are female.  Are elderly.  Naturally produce more earwax.  Clean their ears often with cotton swabs.  Use earplugs often.  Use in-ear headphones often.  Wear hearing aids.  Have narrow ear canals.  Have earwax that is overly thick or sticky.  Have eczema.  Are dehydrated.  Have excess hair in the ear canal.  What are the signs or symptoms? Symptoms of this condition include:  Reduced or muffled hearing.  A feeling of fullness in the ear or feeling that the ear is plugged.  Fluid coming from the ear.  Ear pain.  Ear itch.  Ringing in the ear.  Coughing.  An obvious piece of earwax that can be seen inside the ear canal.  How is this diagnosed? This condition may be diagnosed based on:  Your symptoms.  Your medical history.  An ear exam. During the exam, your health care provider will look into your ear with an instrument called an otoscope.  You may have tests, including a hearing test. How is this treated? This condition may be treated by:  Using ear drops to soften the earwax.  Having the earwax removed by a health care provider. The health  care provider may: ? Flush the ear with water. ? Use an instrument that has a loop on the end (curette). ? Use a suction device.  Surgery to remove the wax buildup. This may be done in severe cases.  Follow these instructions at home:  Take over-the-counter and prescription medicines only as told by your health care provider.  Do not put any objects, including cotton swabs, into your ear. You can clean the opening of your ear canal with a washcloth or facial tissue.  Follow instructions from your health care provider about cleaning your ears. Do not over-clean your ears.  Drink enough fluid to keep your urine clear or pale yellow. This will help to thin the earwax.  Keep all follow-up visits as told by your health care provider. If earwax builds up in your ears often or if you use hearing aids, consider seeing your health care provider for routine, preventive ear cleanings. Ask your health care provider how often you should schedule your cleanings.  If you have hearing aids, clean them according to instructions from the manufacturer and your health care provider. Contact a health care provider if:  You have ear pain.  You develop a fever.  You have blood, pus, or other fluid coming from your ear.  You have hearing loss.  You have ringing in your ears that does not go away.  Your symptoms do not improve with treatment.  You feel like the room is spinning (vertigo). Summary  Earwax can build up in the ear and cause discomfort or hearing loss.  The most common symptoms of this condition include reduced or muffled hearing and a feeling of fullness in the ear or feeling that the ear is plugged.  This condition may be diagnosed based on your symptoms, your medical history, and an ear exam.  This condition may be treated by using ear drops to soften the earwax or by having the earwax removed by a health care provider.  Do not put any objects, including cotton swabs, into your  ear. You can clean the opening of your ear canal with a washcloth or facial tissue. This information is not intended to replace advice given to you by your health care provider. Make sure you discuss any questions you have with your health care provider. Document Released: 11/12/2004 Document Revised: 12/16/2016 Document Reviewed: 12/16/2016 Elsevier Interactive Patient Education  [employer].

## 2017-11-17 NOTE — Progress Notes (Signed)
Jourdan Maldonado is a 37 y.o.  female who presents with a cc of left ear pain for 24 hours, and three week Hx of congestion. Denies fever, chills, n/v/d, sore throat, cough, or other systemic symptoms. She denies tinnitus or dizziness. ROS is otherwise negative.  O: Vitals:   11/17/17 1652  BP: 130/85  Pulse: 78  Resp: 16  Temp: 98.8 F (37.1 C)  SpO2: 98%   Physical Exam  Constitutional: She is well-developed, well-nourished, and in no distress. No distress.  HENT:  Head: Normocephalic and atraumatic.  Right Ear: External ear normal.  Left Ear: External ear normal. A middle ear effusion is present.  Mouth/Throat: Oropharynx is clear and moist. No oropharyngeal exudate.  Right cerumen impaction  Eyes: Conjunctivae are normal.  Neck: Neck supple.  Cardiovascular: Normal rate and regular rhythm.  Pulmonary/Chest: Effort normal and breath sounds normal.  Lymphadenopathy:    She has no cervical adenopathy.  Neurological: She is alert.  Skin: Skin is warm and dry. She is not diaphoretic.  Nursing note and vitals reviewed.  A:  1. Impacted cerumen of right ear   2. Congestion of nasal sinus   3. Left ear pain    P:  1. Impacted cerumen of right ear  - Ear Lavage, impaction cleared  2. Congestion of nasal sinus -OTC antihistamine of choice daily - fluticasone (FLONASE) 50 MCG/ACT nasal spray; Place into both nostrils daily. - montelukast (SINGULAIR) 10 MG tablet; Take 1 tablet (10 mg total) by mouth at bedtime.  Dispense: 30 tablet; Refill: 3  3. Left ear pain -Tylenol or motrin, follow up as needed. - fluticasone (FLONASE) 50 MCG/ACT nasal spray; Place into both nostrils twice daily.

## 2017-11-29 ENCOUNTER — Encounter: Payer: Self-pay | Admitting: Family Medicine

## 2017-11-30 ENCOUNTER — Other Ambulatory Visit: Payer: Self-pay

## 2017-11-30 ENCOUNTER — Ambulatory Visit (INDEPENDENT_AMBULATORY_CARE_PROVIDER_SITE_OTHER): Payer: 59 | Admitting: Family Medicine

## 2017-11-30 ENCOUNTER — Encounter: Payer: Self-pay | Admitting: Family Medicine

## 2017-11-30 VITALS — BP 122/82 | HR 74 | Temp 98.2°F | Resp 16 | Ht 63.0 in | Wt 196.5 lb

## 2017-11-30 DIAGNOSIS — Z0000 Encounter for general adult medical examination without abnormal findings: Secondary | ICD-10-CM

## 2017-11-30 DIAGNOSIS — E559 Vitamin D deficiency, unspecified: Secondary | ICD-10-CM

## 2017-11-30 DIAGNOSIS — E538 Deficiency of other specified B group vitamins: Secondary | ICD-10-CM

## 2017-11-30 DIAGNOSIS — E669 Obesity, unspecified: Secondary | ICD-10-CM

## 2017-11-30 LAB — CBC WITH DIFFERENTIAL/PLATELET
Basophils Absolute: 0 10*3/uL (ref 0.0–0.1)
Basophils Relative: 0.3 % (ref 0.0–3.0)
Eosinophils Absolute: 0.1 10*3/uL (ref 0.0–0.7)
Eosinophils Relative: 2.3 % (ref 0.0–5.0)
HCT: 37.4 % (ref 36.0–46.0)
Hemoglobin: 12.5 g/dL (ref 12.0–15.0)
Lymphocytes Relative: 32.2 % (ref 12.0–46.0)
Lymphs Abs: 1.9 10*3/uL (ref 0.7–4.0)
MCHC: 33.4 g/dL (ref 30.0–36.0)
MCV: 89.2 fl (ref 78.0–100.0)
Monocytes Absolute: 0.4 10*3/uL (ref 0.1–1.0)
Monocytes Relative: 6.9 % (ref 3.0–12.0)
Neutro Abs: 3.5 10*3/uL (ref 1.4–7.7)
Neutrophils Relative %: 58.3 % (ref 43.0–77.0)
Platelets: 264 10*3/uL (ref 150.0–400.0)
RBC: 4.2 Mil/uL (ref 3.87–5.11)
RDW: 13.5 % (ref 11.5–15.5)
WBC: 6 10*3/uL (ref 4.0–10.5)

## 2017-11-30 LAB — LIPID PANEL
Cholesterol: 207 mg/dL — ABNORMAL HIGH (ref 0–200)
HDL: 42.4 mg/dL (ref >39.00)
LDL Cholesterol: 148 mg/dL — ABNORMAL HIGH (ref 0–99)
NonHDL: 164.35
Total CHOL/HDL Ratio: 5
Triglycerides: 83 mg/dL (ref 0.0–149.0)
VLDL: 16.6 mg/dL (ref 0.0–40.0)

## 2017-11-30 LAB — HEPATIC FUNCTION PANEL
ALT: 12 U/L (ref 0–35)
AST: 13 U/L (ref 0–37)
Albumin: 4.1 g/dL (ref 3.5–5.2)
Alkaline Phosphatase: 62 U/L (ref 39–117)
Bilirubin, Direct: 0.1 mg/dL (ref 0.0–0.3)
Total Bilirubin: 0.4 mg/dL (ref 0.2–1.2)
Total Protein: 6.2 g/dL (ref 6.0–8.3)

## 2017-11-30 LAB — BASIC METABOLIC PANEL
BUN: 11 mg/dL (ref 6–23)
CO2: 28 mEq/L (ref 19–32)
Calcium: 8.8 mg/dL (ref 8.4–10.5)
Chloride: 106 mEq/L (ref 96–112)
Creatinine, Ser: 0.58 mg/dL (ref 0.40–1.20)
GFR: 123.27 mL/min (ref >60.00)
Glucose, Bld: 106 mg/dL — ABNORMAL HIGH (ref 70–99)
Potassium: 3.9 mEq/L (ref 3.5–5.1)
Sodium: 140 mEq/L (ref 135–145)

## 2017-11-30 LAB — VITAMIN D 25 HYDROXY (VIT D DEFICIENCY, FRACTURES): VITD: 17.68 ng/mL — ABNORMAL LOW (ref 30.00–100.00)

## 2017-11-30 LAB — TSH: TSH: 2.43 u[IU]/mL (ref 0.35–4.50)

## 2017-11-30 LAB — VITAMIN B12: Vitamin B-12: 230 pg/mL (ref 211–911)

## 2017-11-30 MED ORDER — SERTRALINE HCL 100 MG PO TABS: 100.0000 mg | ORAL_TABLET | Freq: Every day | ORAL | 1 refills | Status: DC

## 2017-11-30 MED FILL — SERTRALINE HCL 100 MG TAB: 100 | 90 days supply | Qty: 90 | Fill #0

## 2017-11-30 NOTE — Assessment & Plan Note (Signed)
Pt's PE WNL w/ exception of obesity.  UTD on pap, immunizations.  Check labs.  Anticipatory guidance provided.

## 2017-11-30 NOTE — Assessment & Plan Note (Signed)
Check labs and replete prn. 

## 2017-11-30 NOTE — Progress Notes (Signed)
   Subjective:    Patient ID: Rose Bell, female    DOB: [birth year], 37 y.o.   MRN: [id number]  HPI CPE- UTD on pap, Tdap.  UTD on flu.   Review of Systems Patient reports no vision/ hearing changes, adenopathy,fever, weight change,  persistant/recurrent hoarseness , swallowing issues, chest pain, palpitations, edema, persistant/recurrent cough, hemoptysis, dyspnea (rest/exertional/paroxysmal nocturnal), gastrointestinal bleeding (melena, rectal bleeding), abdominal pain, significant heartburn, bowel changes, GU symptoms (dysuria, hematuria, incontinence), Gyn symptoms (abnormal  bleeding, pain),  syncope, focal weakness, memory loss, numbness & tingling, skin/hair/nail changes, abnormal bruising or bleeding.   + anxiety despite Zoloft.    Objective:   Physical Exam General Appearance:    Alert, cooperative, no distress, appears stated age, obese  Head:    Normocephalic, without obvious abnormality, atraumatic  Eyes:    PERRL, conjunctiva/corneas clear, EOM's intact, fundi    benign, both eyes  Ears:    Normal TM's and external ear canals, both ears  Nose:   Nares normal, septum midline, mucosa normal, no drainage    or sinus tenderness  Throat:   Lips, mucosa, and tongue normal; teeth and gums normal  Neck:   Supple, symmetrical, trachea midline, no adenopathy;    Thyroid: no enlargement/tenderness/nodules  Back:     Symmetric, no curvature, ROM normal, no CVA tenderness  Lungs:     Clear to auscultation bilaterally, respirations unlabored  Chest Wall:    No tenderness or deformity   Heart:    Regular rate and rhythm, S1 and S2 normal, no murmur, rub   or gallop  Breast Exam:    Deferred  Abdomen:     Soft, non-tender, bowel sounds active all four quadrants,    no masses, no organomegaly  Genitalia:    Deferred  Rectal:    Extremities:   Extremities normal, atraumatic, no cyanosis or edema  Pulses:   2+ and symmetric all extremities  Skin:   Skin color, texture, turgor normal,  no rashes or lesions  Lymph nodes:   Cervical, supraclavicular, and axillary nodes normal  Neurologic:   CNII-XII intact, normal strength, sensation and reflexes    throughout          Assessment & Plan:

## 2017-11-30 NOTE — Assessment & Plan Note (Signed)
Ongoing issue for pt.  Stressed need for healthy diet and regular exercise.  Check labs to risk stratify.  Will follow 

## 2017-11-30 NOTE — Assessment & Plan Note (Signed)
Pt has hx of this.  Check labs.  Replete prn. 

## 2017-11-30 NOTE — Patient Instructions (Signed)
Follow up in 6 months to recheck cholesterol We'll notify you of your lab results and make any changes if needed Continue to work on healthy diet and regular exercise- you can do it!! Keep track of your hormonal symptoms and let me know if you want to start birth control to regulate this Increase the Sertraline to 100mg  daily- 2 of what you have at home and 1 of the new prescription Call with any questions or concerns Happy Valentine's Day!!!

## 2017-12-02 ENCOUNTER — Other Ambulatory Visit: Payer: Self-pay | Admitting: General Practice

## 2017-12-02 MED ORDER — VITAMIN D (ERGOCALCIFEROL) 1.25 MG (50000 UNIT) PO CAPS: 50000.0000 [IU] | ORAL_CAPSULE | ORAL | 0 refills | Status: DC

## 2017-12-02 MED ORDER — ATORVASTATIN CALCIUM 40 MG PO TABS: ORAL_TABLET | ORAL | 1 refills | Status: DC

## 2017-12-02 MED FILL — VIT D2 1.25 MG (50,000 UNIT: 1.25 MG | 84 days supply | Qty: 12 | Fill #0

## 2017-12-02 MED FILL — ATORVASTATIN 40 MG TABLET: 40 | 90 days supply | Qty: 90 | Fill #0

## 2018-03-01 ENCOUNTER — Encounter: Payer: Self-pay | Admitting: Family Medicine

## 2018-03-07 ENCOUNTER — Other Ambulatory Visit (HOSPITAL_COMMUNITY)
Admission: RE | Admit: 2018-03-07 | Discharge: 2018-03-07 | Disposition: A | Discharge status: Home / Self Care | Payer: 59 | Source: Ambulatory Visit | Attending: Family Medicine | Admitting: Family Medicine

## 2018-03-07 ENCOUNTER — Ambulatory Visit (INDEPENDENT_AMBULATORY_CARE_PROVIDER_SITE_OTHER): Payer: 59 | Admitting: Family Medicine

## 2018-03-07 ENCOUNTER — Other Ambulatory Visit: Payer: Self-pay

## 2018-03-07 ENCOUNTER — Encounter: Payer: Self-pay | Admitting: Family Medicine

## 2018-03-07 VITALS — BP 120/72 | HR 67 | Temp 98.8°F | Resp 16 | Ht 63.0 in | Wt 196.4 lb

## 2018-03-07 DIAGNOSIS — N94819 Vulvodynia, unspecified: Secondary | ICD-10-CM | POA: Insufficient documentation

## 2018-03-07 NOTE — Patient Instructions (Signed)
We'll notify you of your urine results and make any changes if needed Try and wear all cotton underwear and loose fitting clothes to avoid irritation If no improvement by the end of this week, let me know and we'll refer you to GYN Avoid any changes in soaps, lotions, detergents and avoid all douches, feminine sprays, etc Call with any questions or concerns Hang in there!

## 2018-03-07 NOTE — Progress Notes (Signed)
   Subjective:    Patient ID: Rose Bell, female    DOB: 23-May-1981, 37 y.o.   MRN: [id number]  HPI Vaginal pain- sxs x1 week.  Pt describes a 'burning only on the clitoris'.  Burning will 'come and go'.  Asymptomatic in the AM but once she gets dressed and goes to work she will have the burning.  Changed body wash last week but switched back w/o improvement.  No change in laundry detergent.  No burning w/ urination, frequency, urgency.  No rashes or skin changes.  Pt took AZO w/o relief.   Review of Systems For ROS see HPI     Objective:   Physical Exam  Constitutional: She appears well-developed and well-nourished. No distress.  Genitourinary: Vagina normal. No vaginal discharge found.  Skin: Skin is warm and dry. No rash noted. No erythema.  Vitals reviewed.         Assessment & Plan:  Vulvodynia- new.  Pt complains of burning around her clitoris.  No rashes, no lesions, no obvious causes of sensitivity.  No TTP on exam.  Suspect this was triggered by her recent change in soaps.  Will check for BV and yeast as these can trigger sxs.  If no improvement by later this week, will refer to GYN.  Pt expressed understanding and is in agreement w/ plan.

## 2018-03-08 ENCOUNTER — Encounter: Payer: Self-pay | Admitting: Family Medicine

## 2018-03-09 LAB — URINE CYTOLOGY ANCILLARY ONLY
Bacterial vaginitis: NEGATIVE
Candida vaginitis: NEGATIVE

## 2018-03-10 ENCOUNTER — Encounter: Payer: Self-pay | Admitting: Family Medicine

## 2018-03-16 ENCOUNTER — Encounter: Payer: Self-pay | Admitting: Family Medicine

## 2018-03-16 ENCOUNTER — Ambulatory Visit: Payer: Self-pay | Admitting: Family Medicine

## 2018-03-16 VITALS — BP 122/80 | HR 108 | Temp 99.7°F | Wt 194.0 lb

## 2018-03-16 DIAGNOSIS — J069 Acute upper respiratory infection, unspecified: Secondary | ICD-10-CM

## 2018-03-16 DIAGNOSIS — R110 Nausea: Secondary | ICD-10-CM

## 2018-03-16 NOTE — Progress Notes (Signed)
Rose Bell is a 37 y.o. female who presents today with concerns of fever, malaise, cough and intermittent nausea. Patient denies risk for pregnancy at this time and denies sick contacts at home or work.  Review of Systems  Constitutional: Positive for chills, fever and malaise/fatigue.  HENT: Positive for sore throat. Negative for congestion, ear discharge, ear pain and sinus pain.   Eyes: Negative.   Respiratory: Positive for cough. Negative for sputum production and shortness of breath.   Cardiovascular: Negative.  Negative for chest pain.  Gastrointestinal: Negative for abdominal pain, diarrhea, nausea and vomiting.  Genitourinary: Negative for dysuria, frequency, hematuria and urgency.  Musculoskeletal: Negative for myalgias.  Skin: Negative.   Neurological: Negative for headaches.  Endo/Heme/Allergies: Negative.   Psychiatric/Behavioral: Negative.     O: Vitals:   03/16/18 0815  BP: 122/80  Pulse: (!) 108  Temp: 99.7 F (37.6 C)  SpO2: 98%     Physical Exam  Constitutional: She is oriented to person, place, and time. Vital signs are normal. She appears well-developed and well-nourished. She is active.  Non-toxic appearance. She does not have a sickly appearance. She appears ill.  HENT:  Head: Normocephalic.  Right Ear: Hearing, tympanic membrane, external ear and ear canal normal.  Left Ear: Hearing, tympanic membrane, external ear and ear canal normal.  Nose: Nose normal.  Mouth/Throat: Uvula is midline. Posterior oropharyngeal erythema present.  Neck: Normal range of motion. Neck supple.  Cardiovascular: Normal rate, regular rhythm, normal heart sounds and normal pulses.  Pulmonary/Chest: Effort normal and breath sounds normal.  Abdominal: Soft. Bowel sounds are normal.  Musculoskeletal: Normal range of motion.  Lymphadenopathy:       Head (right side): No submental and no submandibular adenopathy present.       Head (left side): No submental and no submandibular  adenopathy present.    She has no cervical adenopathy.  Neurological: She is alert and oriented to person, place, and time.  Psychiatric: She has a normal mood and affect.  Vitals reviewed.  A: 1. Upper respiratory tract infection, unspecified type    P: Exam findings, diagnosis etiology and medication use and indications reviewed with patient. Follow- Up and discharge instructions provided. No emergent/urgent issues found on exam.  Patient verbalized understanding of information provided and agrees with plan of care (POC), all questions answered.  1. Upper respiratory tract infection, unspecified type Work note x 48 hours, symptomatic treatment self care Will give f/u call in 48 hours to assess symptoms. Tylenol 650 mg every 6 hours as needed for pain and fever Motrin 800 mg with food every 8 hours as needed for pain/fever  2. Nausea Declines treatment for this symptom

## 2018-03-16 NOTE — Patient Instructions (Signed)
PLAN< Tylenol 650 mg every 6 hours as needed for pain and fever Motrin 800 mg with food every 8 hours as needed for pain/fever  Viral Respiratory Infection A viral respiratory infection is an illness that affects parts of the body used for breathing, like the lungs, nose, and throat. It is caused by a germ called a virus. Some examples of this kind of infection are:  A cold.  The flu (influenza).  A respiratory syncytial virus (RSV) infection.  How do I know if I have this infection? Most of the time this infection causes:  A stuffy or runny nose.  Yellow or green fluid in the nose.  A cough.  Sneezing.  Tiredness (fatigue).  Achy muscles.  A sore throat.  Sweating or chills.  A fever.  A headache.  How is this infection treated? If the flu is diagnosed early, it may be treated with an antiviral medicine. This medicine shortens the length of time a person has symptoms. Symptoms may be treated with over-the-counter and prescription medicines, such as:  Expectorants. These make it easier to cough up mucus.  Decongestant nasal sprays.  Doctors do not prescribe antibiotic medicines for viral infections. They do not work with this kind of infection. How do I know if I should stay home? To keep others from getting sick, stay home if you have:  A fever.  A lasting cough.  A sore throat.  A runny nose.  Sneezing.  Muscles aches.  Headaches.  Tiredness.  Weakness.  Chills.  Sweating.  An upset stomach (nausea).  Follow these instructions at home:  Rest as much as possible.  Take over-the-counter and prescription medicines only as told by your doctor.  Drink enough fluid to keep your pee (urine) clear or pale yellow.  Gargle with salt water. Do this 3-4 times per day or as needed. To make a salt-water mixture, dissolve -1 tsp of salt in 1 cup of warm water. Make sure the salt dissolves all the way.  Use nose drops made from salt water. This  helps with stuffiness (congestion). It also helps soften the skin around your nose.  Do not drink alcohol.  Do not use tobacco products, including cigarettes, chewing tobacco, and e-cigarettes. If you need help quitting, ask your doctor. Get help if:  Your symptoms last for 10 days or longer.  Your symptoms get worse over time.  You have a fever.  You have very bad pain in your face or forehead.  Parts of your jaw or neck become very swollen. Get help right away if:  You feel pain or pressure in your chest.  You have shortness of breath.  You faint or feel like you will faint.  You keep throwing up (vomiting).  You feel confused. This information is not intended to replace advice given to you by your health care provider. Make sure you discuss any questions you have with your health care provider. Document Released: 09/17/2008 Document Revised: 03/12/2016 Document Reviewed: 03/13/2015 Elsevier Interactive Patient Education  2018 [employer].

## 2018-03-21 ENCOUNTER — Other Ambulatory Visit: Payer: Self-pay

## 2018-03-21 ENCOUNTER — Ambulatory Visit (INDEPENDENT_AMBULATORY_CARE_PROVIDER_SITE_OTHER): Payer: 59 | Admitting: Physician Assistant

## 2018-03-21 ENCOUNTER — Encounter: Payer: Self-pay | Admitting: Physician Assistant

## 2018-03-21 ENCOUNTER — Encounter: Payer: Self-pay | Admitting: Family Medicine

## 2018-03-21 VITALS — BP 134/86 | HR 81 | Temp 98.2°F | Resp 17 | Ht 63.0 in | Wt 193.0 lb

## 2018-03-21 DIAGNOSIS — J019 Acute sinusitis, unspecified: Secondary | ICD-10-CM | POA: Diagnosis not present

## 2018-03-21 DIAGNOSIS — J029 Acute pharyngitis, unspecified: Secondary | ICD-10-CM | POA: Diagnosis not present

## 2018-03-21 DIAGNOSIS — B9689 Other specified bacterial agents as the cause of diseases classified elsewhere: Secondary | ICD-10-CM | POA: Diagnosis not present

## 2018-03-21 MED ORDER — AMOXICILLIN 875 MG PO TABS: 875.0000 mg | ORAL_TABLET | Freq: Two times a day (BID) | ORAL | 0 refills | Status: DC

## 2018-03-21 MED FILL — AMOXICILLIN 875 MG TABLET: 875 | 7 days supply | Qty: 14 | Fill #0

## 2018-03-21 NOTE — Progress Notes (Signed)
Rose Bell is a 37 y.o. female presenting with a sore throat for 7 days.  Associated symptoms include:  fever, chills, nasal/sinus congestion, runny nose, ear pain, ear fullness, muscle aches and post nasal drip.  Symptoms are progressively worsening.  Home treatment thus far includes:  rest, hydration, NSAIDS/acetaminophen and OTC sore throat / cold products.  No known sick contacts with similar symptoms.  There is no history of of similar symptoms.  ROS Pertinent ROS are in the HPI.   EXAMINATION: Exam:  BP 134/86   Pulse 81   Temp 98.2 F (36.8 C) (Oral)   Resp 17   Ht 5\' 3"  (1.6 m)   Wt 193 lb (87.5 kg)   SpO2 94%   BMI 34.19 kg/m   Physical Exam  Constitutional: She is oriented to person, place, and time and well-developed, well-nourished, and in no distress.  HENT:  Head: Normocephalic and atraumatic.  Right Ear: Tympanic membrane normal.  Left Ear: Tympanic membrane normal.  Nose: Mucosal edema and rhinorrhea present. Right sinus exhibits no maxillary sinus tenderness and no frontal sinus tenderness. Left sinus exhibits no maxillary sinus tenderness and no frontal sinus tenderness.  Mouth/Throat: Uvula is midline and mucous membranes are normal. Posterior oropharyngeal erythema present. No oropharyngeal exudate, posterior oropharyngeal edema or tonsillar abscesses.  Eyes: Conjunctivae are normal.  Neck: Neck supple.  Cardiovascular: Normal rate, regular rhythm, normal heart sounds and intact distal pulses.  Pulmonary/Chest: Effort normal and breath sounds normal. No respiratory distress. She has no wheezes. She has no rales. She exhibits no tenderness.  Lymphadenopathy:    She has no cervical adenopathy.  Neurological: She is alert and oriented to person, place, and time.  Skin: Skin is warm and dry.  Vitals reviewed.  Assessment/Plan: 1. Acute bacterial sinusitis Rx Amoxicillin.  Increase fluids.  Rest.  Saline nasal spray.  Probiotic.  Mucinex as directed.   Humidifier in bedroom.  Call or return to clinic if symptoms are not improving.  - amoxicillin (AMOXIL) 875 MG tablet; Take 1 tablet (875 mg total) by mouth 2 (two) times daily.  Dispense: 14 tablet; Refill: 0  2. Pharyngitis, unspecified etiology Suspect secondary to PND. Continue antihistamines. Start Flonase. Supportive measures and OTC medications reviewed with patient. Amoxicillin for sinusitis should cover any type of bacterial pharyngitis present but examination was unremarkable for such.  - amoxicillin (AMOXIL) 875 MG tablet; Take 1 tablet (875 mg total) by mouth 2 (two) times daily.  Dispense: 14 tablet; Refill: 0

## 2018-03-21 NOTE — Patient Instructions (Signed)
Please take antibiotic as directed.  Increase fluid intake.  Use Saline nasal spray.  Take a daily multivitamin. Continue Flonase and antihistamines. Start salt-water gargles and chloraseptic spray.  Place a humidifier in the bedroom.  Please call or return clinic if symptoms are not improving.  Sinusitis Sinusitis is redness, soreness, and swelling (inflammation) of the paranasal sinuses. Paranasal sinuses are air pockets within the bones of your face (beneath the eyes, the middle of the forehead, or above the eyes). In healthy paranasal sinuses, mucus is able to drain out, and air is able to circulate through them by way of your nose. However, when your paranasal sinuses are inflamed, mucus and air can become trapped. This can allow bacteria and other germs to grow and cause infection. Sinusitis can develop quickly and last only a short time (acute) or continue over a long period (chronic). Sinusitis that lasts for more than 12 weeks is considered chronic.  CAUSES  Causes of sinusitis include:  Allergies.  Structural abnormalities, such as displacement of the cartilage that separates your nostrils (deviated septum), which can decrease the air flow through your nose and sinuses and affect sinus drainage.  Functional abnormalities, such as when the small hairs (cilia) that line your sinuses and help remove mucus do not work properly or are not present. SYMPTOMS  Symptoms of acute and chronic sinusitis are the same. The primary symptoms are pain and pressure around the affected sinuses. Other symptoms include:  Upper toothache.  Earache.  Headache.  Bad breath.  Decreased sense of smell and taste.  A cough, which worsens when you are lying flat.  Fatigue.  Fever.  Thick drainage from your nose, which often is green and may contain pus (purulent).  Swelling and warmth over the affected sinuses. DIAGNOSIS  Your caregiver will perform a physical exam. During the exam, your caregiver  may:  Look in your nose for signs of abnormal growths in your nostrils (nasal polyps).  Tap over the affected sinus to check for signs of infection.  View the inside of your sinuses (endoscopy) with a special imaging device with a light attached (endoscope), which is inserted into your sinuses. If your caregiver suspects that you have chronic sinusitis, one or more of the following tests may be recommended:  Allergy tests.  Nasal culture A sample of mucus is taken from your nose and sent to a lab and screened for bacteria.  Nasal cytology A sample of mucus is taken from your nose and examined by your caregiver to determine if your sinusitis is related to an allergy. TREATMENT  Most cases of acute sinusitis are related to a viral infection and will resolve on their own within 10 days. Sometimes medicines are prescribed to help relieve symptoms (pain medicine, decongestants, nasal steroid sprays, or saline sprays).  However, for sinusitis related to a bacterial infection, your caregiver will prescribe antibiotic medicines. These are medicines that will help kill the bacteria causing the infection.  Rarely, sinusitis is caused by a fungal infection. In theses cases, your caregiver will prescribe antifungal medicine. For some cases of chronic sinusitis, surgery is needed. Generally, these are cases in which sinusitis recurs more than 3 times per year, despite other treatments. HOME CARE INSTRUCTIONS   Drink plenty of water. Water helps thin the mucus so your sinuses can drain more easily.  Use a humidifier.  Inhale steam 3 to 4 times a day (for example, sit in the bathroom with the shower running).  Apply a warm, moist  washcloth to your face 3 to 4 times a day, or as directed by your caregiver.  Use saline nasal sprays to help moisten and clean your sinuses.  Take over-the-counter or prescription medicines for pain, discomfort, or fever only as directed by your caregiver. SEEK IMMEDIATE  MEDICAL CARE IF:  You have increasing pain or severe headaches.  You have nausea, vomiting, or drowsiness.  You have swelling around your face.  You have vision problems.  You have a stiff neck.  You have difficulty breathing. MAKE SURE YOU:   Understand these instructions.  Will watch your condition.  Will get help right away if you are not doing well or get worse. Document Released: 10/05/2005 Document Revised: 12/28/2011 Document Reviewed: 10/20/2011 [facility] Patient Information 2014 [place], [place].

## 2018-04-07 MED FILL — SERTRALINE HCL 100 MG TAB: 100 | 90 days supply | Qty: 90 | Fill #1

## 2018-04-07 MED FILL — ATORVASTATIN 40 MG TABLET: 40 | 90 days supply | Qty: 90 | Fill #1

## 2018-05-27 ENCOUNTER — Ambulatory Visit: Payer: 59 | Admitting: Family Medicine

## 2018-06-08 ENCOUNTER — Ambulatory Visit (INDEPENDENT_AMBULATORY_CARE_PROVIDER_SITE_OTHER): Payer: 59 | Admitting: Family Medicine

## 2018-06-08 ENCOUNTER — Other Ambulatory Visit: Payer: Self-pay

## 2018-06-08 ENCOUNTER — Encounter: Payer: Self-pay | Admitting: Family Medicine

## 2018-06-08 VITALS — BP 132/86 | HR 69 | Temp 98.6°F | Resp 15 | Ht 63.0 in | Wt 191.8 lb

## 2018-06-08 DIAGNOSIS — E785 Hyperlipidemia, unspecified: Secondary | ICD-10-CM

## 2018-06-08 DIAGNOSIS — F411 Generalized anxiety disorder: Secondary | ICD-10-CM

## 2018-06-08 LAB — HEPATIC FUNCTION PANEL
ALT: 13 U/L (ref 0–35)
AST: 12 U/L (ref 0–37)
Albumin: 4.4 g/dL (ref 3.5–5.2)
Alkaline Phosphatase: 75 U/L (ref 39–117)
Bilirubin, Direct: 0.1 mg/dL (ref 0.0–0.3)
Total Bilirubin: 0.6 mg/dL (ref 0.2–1.2)
Total Protein: 6.9 g/dL (ref 6.0–8.3)

## 2018-06-08 LAB — LIPID PANEL
Cholesterol: 216 mg/dL — ABNORMAL HIGH (ref 0–200)
HDL: 46.7 mg/dL (ref >39.00)
LDL Cholesterol: 138 mg/dL — ABNORMAL HIGH (ref 0–99)
NonHDL: 169.39
Total CHOL/HDL Ratio: 5
Triglycerides: 158 mg/dL — ABNORMAL HIGH (ref 0.0–149.0)
VLDL: 31.6 mg/dL (ref 0.0–40.0)

## 2018-06-08 LAB — BASIC METABOLIC PANEL
BUN: 12 mg/dL (ref 6–23)
CO2: 27 mEq/L (ref 19–32)
Calcium: 9.8 mg/dL (ref 8.4–10.5)
Chloride: 103 mEq/L (ref 96–112)
Creatinine, Ser: 0.72 mg/dL (ref 0.40–1.20)
GFR: 95.78 mL/min (ref >60.00)
Glucose, Bld: 94 mg/dL (ref 70–99)
Potassium: 4 mEq/L (ref 3.5–5.1)
Sodium: 139 mEq/L (ref 135–145)

## 2018-06-08 NOTE — Assessment & Plan Note (Signed)
Chronic problem.  Tolerating statin w/o difficulty but does forget to take meds at times.  Applauded her decision to start exercising again.  Check labs.  Adjust meds prn

## 2018-06-08 NOTE — Progress Notes (Signed)
   Subjective:    Patient ID: Graceann Congress, female    DOB: 08/03/1981, 37 y.o.   MRN: [id number]  HPI Hyperlipidemia- chronic problem, on Lipitor 40mg  daily.  Is again exercising- 3-4 weeks into.  No CP, SOB, abd pain, N/V, myalgias.    Anxiety- oldest daughter is pregnant and about to be a single mom.  Youngest son is now in anger management after mom had to put him in jail for a few days.  On Zoloft 100mg  daily.  Pt has thought about counseling but is not interested at this time.   Review of Systems For ROS see HPI     Objective:   Physical Exam  Constitutional: She is oriented to person, place, and time. She appears well-developed and well-nourished. No distress.  obese  HENT:  Head: Normocephalic and atraumatic.  Eyes: Pupils are equal, round, and reactive to light. Conjunctivae and EOM are normal.  Neck: Normal range of motion. Neck supple. No thyromegaly present.  Cardiovascular: Normal rate, regular rhythm, normal heart sounds and intact distal pulses.  No murmur heard. Pulmonary/Chest: Effort normal and breath sounds normal. No respiratory distress.  Abdominal: Soft. She exhibits no distension. There is no tenderness.  Musculoskeletal: She exhibits no edema.  Lymphadenopathy:    She has no cervical adenopathy.  Neurological: She is alert and oriented to person, place, and time.  Skin: Skin is warm and dry.  Psychiatric: She has a normal mood and affect. Her behavior is normal.  Vitals reviewed.         Assessment & Plan:

## 2018-06-08 NOTE — Assessment & Plan Note (Signed)
Stable.  Pt has had a very difficult summer but is managing well.  No med changes at this time.  Will follow.

## 2018-06-08 NOTE — Patient Instructions (Signed)
Schedule your complete physical in 6 months We'll notify you of your lab results and make any changes if needed Continue to work on healthy diet and regular exercise- you can do it! Call with any questions or concerns Hang in there!  You are stronger than you know!!!

## 2018-12-01 ENCOUNTER — Ambulatory Visit (INDEPENDENT_AMBULATORY_CARE_PROVIDER_SITE_OTHER): Payer: 59 | Admitting: Family Medicine

## 2018-12-01 ENCOUNTER — Encounter: Payer: Self-pay | Admitting: Family Medicine

## 2018-12-01 ENCOUNTER — Other Ambulatory Visit: Payer: Self-pay | Admitting: Family Medicine

## 2018-12-01 ENCOUNTER — Other Ambulatory Visit: Payer: Self-pay

## 2018-12-01 VITALS — BP 122/86 | HR 81 | Temp 98.1°F | Resp 16 | Ht 63.0 in | Wt 193.0 lb

## 2018-12-01 DIAGNOSIS — Z0000 Encounter for general adult medical examination without abnormal findings: Secondary | ICD-10-CM

## 2018-12-01 DIAGNOSIS — E669 Obesity, unspecified: Secondary | ICD-10-CM | POA: Diagnosis not present

## 2018-12-01 DIAGNOSIS — E785 Hyperlipidemia, unspecified: Secondary | ICD-10-CM

## 2018-12-01 DIAGNOSIS — E559 Vitamin D deficiency, unspecified: Secondary | ICD-10-CM | POA: Diagnosis not present

## 2018-12-01 LAB — CBC WITH DIFFERENTIAL/PLATELET
Basophils Absolute: 0 10*3/uL (ref 0.0–0.1)
Basophils Relative: 0.4 % (ref 0.0–3.0)
Eosinophils Absolute: 0.1 10*3/uL (ref 0.0–0.7)
Eosinophils Relative: 1.7 % (ref 0.0–5.0)
HCT: 39.4 % (ref 36.0–46.0)
Hemoglobin: 13.2 g/dL (ref 12.0–15.0)
Lymphocytes Relative: 37.8 % (ref 12.0–46.0)
Lymphs Abs: 2.3 10*3/uL (ref 0.7–4.0)
MCHC: 33.4 g/dL (ref 30.0–36.0)
MCV: 88.5 fl (ref 78.0–100.0)
Monocytes Absolute: 0.4 10*3/uL (ref 0.1–1.0)
Monocytes Relative: 7.1 % (ref 3.0–12.0)
Neutro Abs: 3.2 10*3/uL (ref 1.4–7.7)
Neutrophils Relative %: 53 % (ref 43.0–77.0)
Platelets: 282 10*3/uL (ref 150.0–400.0)
RBC: 4.45 Mil/uL (ref 3.87–5.11)
RDW: 13.6 % (ref 11.5–15.5)
WBC: 6.1 10*3/uL (ref 4.0–10.5)

## 2018-12-01 LAB — VITAMIN D 25 HYDROXY (VIT D DEFICIENCY, FRACTURES): VITD: 18.3 ng/mL — ABNORMAL LOW (ref 30.00–100.00)

## 2018-12-01 LAB — BASIC METABOLIC PANEL WITH GFR
BUN: 11 mg/dL (ref 6–23)
CO2: 26 meq/L (ref 19–32)
Calcium: 9.1 mg/dL (ref 8.4–10.5)
Chloride: 106 meq/L (ref 96–112)
Creatinine, Ser: 0.68 mg/dL (ref 0.40–1.20)
GFR: 96.03 mL/min
Glucose, Bld: 98 mg/dL (ref 70–99)
Potassium: 4 meq/L (ref 3.5–5.1)
Sodium: 141 meq/L (ref 135–145)

## 2018-12-01 LAB — LIPID PANEL
Cholesterol: 279 mg/dL — ABNORMAL HIGH (ref 0–200)
HDL: 49.6 mg/dL
LDL Cholesterol: 202 mg/dL — ABNORMAL HIGH (ref 0–99)
NonHDL: 229.19
Total CHOL/HDL Ratio: 6
Triglycerides: 135 mg/dL (ref 0.0–149.0)
VLDL: 27 mg/dL (ref 0.0–40.0)

## 2018-12-01 LAB — HEPATIC FUNCTION PANEL
ALT: 12 U/L (ref 0–35)
AST: 13 U/L (ref 0–37)
Albumin: 4.3 g/dL (ref 3.5–5.2)
Alkaline Phosphatase: 65 U/L (ref 39–117)
Bilirubin, Direct: 0.1 mg/dL (ref 0.0–0.3)
Total Bilirubin: 0.4 mg/dL (ref 0.2–1.2)
Total Protein: 6.5 g/dL (ref 6.0–8.3)

## 2018-12-01 LAB — TSH: TSH: 2.61 u[IU]/mL (ref 0.35–4.50)

## 2018-12-01 MED ORDER — ATORVASTATIN CALCIUM 40 MG PO TABS: ORAL_TABLET | ORAL | 1 refills | Status: DC

## 2018-12-01 NOTE — Progress Notes (Signed)
Pt to restart medication

## 2018-12-01 NOTE — Assessment & Plan Note (Signed)
Pt has hx of this.  Check labs and replete prn. 

## 2018-12-01 NOTE — Assessment & Plan Note (Signed)
Ongoing issue for pt.  Check labs to risk stratify.  Encouraged healthy diet and regular exercise.  Will follow.

## 2018-12-01 NOTE — Patient Instructions (Signed)
Follow up in 1 year or as needed We'll notify you of your lab results and make any changes if needed Continue to work on healthy diet and regular exercise- you can do it!! Call with any questions or concerns Happy Valentine's Day!!!

## 2018-12-01 NOTE — Assessment & Plan Note (Signed)
Pt's PE WNL w/ exception of obesity.  UTD on Tdap and flu.  Due for pap next year.  Will start mammo next year.  Check labs.  Anticipatory guidance provided.

## 2018-12-01 NOTE — Assessment & Plan Note (Signed)
Chronic problem.  Pt stopped Lipitor in October.  Check labs and determine if meds are needed.

## 2018-12-01 NOTE — Progress Notes (Signed)
   Subjective:    Patient ID: Rose Bell, female    DOB: 1981/04/28, 38 y.o.   MRN: [id number]  HPI CPE- UTD on pap, Tdap and flu.  Has not been on medication since October 2019.     Review of Systems Patient reports no vision/ hearing changes, adenopathy,fever, weight change,  persistant/recurrent hoarseness , swallowing issues, chest pain, palpitations, edema, persistant/recurrent cough, hemoptysis, dyspnea (rest/exertional/paroxysmal nocturnal), gastrointestinal bleeding (melena, rectal bleeding), abdominal pain, significant heartburn, bowel changes, GU symptoms (dysuria, hematuria, incontinence), Gyn symptoms (abnormal  bleeding, pain),  syncope, focal weakness, memory loss, numbness & tingling, skin/hair/nail changes, abnormal bruising or bleeding, anxiety, or depression.     Objective:   Physical Exam General Appearance:    Alert, cooperative, no distress, appears stated age  Head:    Normocephalic, without obvious abnormality, atraumatic  Eyes:    PERRL, conjunctiva/corneas clear, EOM's intact, fundi    benign, both eyes  Ears:    Normal TM's and external ear canals, both ears  Nose:   Nares normal, septum midline, mucosa normal, no drainage    or sinus tenderness  Throat:   Lips, mucosa, and tongue normal; teeth and gums normal  Neck:   Supple, symmetrical, trachea midline, no adenopathy;    Thyroid: no enlargement/tenderness/nodules  Back:     Symmetric, no curvature, ROM normal, no CVA tenderness  Lungs:     Clear to auscultation bilaterally, respirations unlabored  Chest Wall:    No tenderness or deformity   Heart:    Regular rate and rhythm, S1 and S2 normal, no murmur, rub   or gallop  Breast Exam:    Deferred to mammo  Abdomen:     Soft, non-tender, bowel sounds active all four quadrants,    no masses, no organomegaly  Genitalia:    Deferred  Rectal:    Extremities:   Extremities normal, atraumatic, no cyanosis or edema  Pulses:   2+ and symmetric all extremities    Skin:   Skin color, texture, turgor normal, no rashes or lesions  Lymph nodes:   Cervical, supraclavicular, and axillary nodes normal  Neurologic:   CNII-XII intact, normal strength, sensation and reflexes    throughout          Assessment & Plan:

## 2018-12-02 ENCOUNTER — Other Ambulatory Visit: Payer: Self-pay | Admitting: General Practice

## 2018-12-02 DIAGNOSIS — E785 Hyperlipidemia, unspecified: Secondary | ICD-10-CM

## 2018-12-02 MED ORDER — VITAMIN D (ERGOCALCIFEROL) 1.25 MG (50000 UNIT) PO CAPS: 50000.0000 [IU] | ORAL_CAPSULE | ORAL | 0 refills | Status: DC

## 2018-12-05 ENCOUNTER — Encounter: Payer: Self-pay | Admitting: Family Medicine

## 2019-05-16 ENCOUNTER — Ambulatory Visit (INDEPENDENT_AMBULATORY_CARE_PROVIDER_SITE_OTHER): Payer: 59 | Admitting: Family Medicine

## 2019-05-16 ENCOUNTER — Other Ambulatory Visit: Payer: Self-pay

## 2019-05-16 DIAGNOSIS — Z713 Dietary counseling and surveillance: Secondary | ICD-10-CM

## 2019-05-16 NOTE — Patient Instructions (Addendum)
Goals:  1. Exercise at least 180 minutes per week, to be distributed over at least three different exercise times.    2. Include at least two vegetable servings per dinner days 6 per week.      One serving = 1/2 cup cooked or 1 cup salad.   3. Try at least one new recipe per week.       Remember that taste preferences are learned.  Exposure is what it takes to learn a new taste preference.    Document progress on above goals on Goals Sheet (emailed today).  For your new recipe each week, write what it is and where you got it on the Goals Sheet.    Identify challenges, frustrations, and feelings you experience as you work on these goals, and write about these on the back of your Goals Sheet.    Follow-up: Thursday, August 13 at 3 PM via [place].  Please be sure to have your Goals Sheet handy at the time of your appointment.

## 2019-05-16 NOTE — Progress Notes (Signed)
Telehealth Encounter I connected with Rose Bell (MRN [id number]) on 05/16/2019 by MyChart video-enabled, HIPAA-compliant telemedicine application, verified that I am speaking with the correct person using two identifiers, and that the patient was in a private environment conducive to confidentiality.  I discussed the limitations of evaluation and management by telemedicine. The patient expressed understanding and agreed to proceed.  Provider was Kennith Center, PhD, RD, LDN, CEDRD Provider(s) located at [facility] during this telehealth encounter; patient was at home  Appt start time: 1530 end time: 1630 (1 hour)  Reason for telehealth visit: Medical Nutrition Therapy for weight management and HLD. (Preventive Med visit; Dietary Counseling and Surveillance.)  Relevant history/background: She feels she's been working on wt loss "forever," and has seldom realized success.  Also concerned re. h/o hypercholesterolemia.  Rose Bell took a statin (lipitor) until 6-12 mo ago, when she D/C'd b/c of concerns over increased anxiety, which went away when after stopping the med.   Eating pattern: 2 meals and 1 snack.   Physical activity: Inconsistent, but walks and hikes 0-5 X wk.   Sleep: Estimates she gets ~8 hrs/night.    Assessment:  24-hr recall:  (Up at 7 AM) B (7 AM)-  2 c coffee, 4 tbsp sug-free flvrd creamer Snk ( AM)-  water L (12 PM)-  1 [employer] rb sandw, 1 c fries, diet Coke Snk ( PM)-  1 c coffee, 2 tbsp sug-free flvrd creamer, water D (6:30)-  3/4 c grilled shrimp&chx, broth, salad, drsng, 1/2 c zucchini, 1/2 c rice, grilled plantains, water, mixed drink Snk ( PM)-  --- Typical day? No.  Typically does not go out for lunch; restaurant food 3-4 X wk.    Intervention: Rose Bell establish reasonable behavioral goals and to determine a way to document her progress.  Rose Bell expressed some anxiety re. tackling these goals, saying she has little confidence in her ability to  follow through, based on her experience.  I emphasized to her that she is in the driver's seat, and that goals are always modifiable.  She insisted she does want to work on the goals determined today.   For goals and recommendations, see Patient Instructions.   Follow-up: 2 weeks via MyChart video visit.    SYKES,JEANNIE

## 2019-06-01 ENCOUNTER — Other Ambulatory Visit: Payer: Self-pay

## 2019-06-01 ENCOUNTER — Ambulatory Visit (INDEPENDENT_AMBULATORY_CARE_PROVIDER_SITE_OTHER): Payer: 59 | Admitting: Family Medicine

## 2019-06-01 DIAGNOSIS — Z713 Dietary counseling and surveillance: Secondary | ICD-10-CM

## 2019-06-01 NOTE — Patient Instructions (Addendum)
-   Go to self-compassion.org and take the quiz on self-compassion.  Record your scores (overall and 6 sub-scores), and email them to [place].    - When you are struggling with negative thoughts (especially if related to a food or exercise choice), use the  process of challenging invasive thoughts:  (1) I am aware that I am having the thought ... (2) I am aware that I am having the body sensation(s) ... (3) I am aware that I am having the emotion(s)  ...   - Use the Feelings list provided today to help you identify feelings, and WRITE your feelings.    Think about times when you are tired:  You may not want to go to bed right now b/c you haven't had enough of your own time to yourself during the day.  Your body, however, is still tired, so is sending signals to go to sleep.  When you don't go to sleep, you will need some kind of energy.  Your brain then may send out signals that tell you to eat - a different source of energy.    Goals remain the same:  1. Exercise at least 180 minutes per week, to be distributed over at least three different exercise times.    2. Include at least two vegetable servings per dinner days 6 per week.  One serving = 1/2 cup cooked or 1 cup salad.   3. Try at least one new recipe per week.    Follow-up on 9/3 at 3 PM at [email or web address]

## 2019-06-01 NOTE — Progress Notes (Signed)
Telehealth Encounter I connected with Sheron Shutes (MRN [id number]) on 06/01/2019 by Doxy.me  video-enabled, HIPAA-compliant telemedicine application, verified that I am speaking with the correct person using two identifiers, and that the patient was in a private environment conducive to confidentiality.  I discussed the limitations of evaluation and management by telemedicine. The patient expressed understanding and agreed to proceed.  Provider was Jean C Sykes, PhD, RD, LDN, CEDRD Provider(s) located at home during this telehealth encounter; patient was at home  Appt start time: 1500 end time: 1600 (1 hour)  Reason for telehealth visit: Medical Nutrition Therapy for weight management and HLD. (Preventive Med visit; Dietary Counseling and Surveillance.)  Relevant history/background: She feels she's been working on wt loss "forever," and has seldom realized success.  Also concerned re. h/o hypercholesterolemia.    Assessment: Lugenia said she has done variably well with her goals.  She has not done as well with her exercise this week, and is feeling negative toward herself.  Patient became tearful when we talked about this; she was very self-critical, especially about her appearance.   She has been using the EveryPlate service for three meals per week, which has helped with trying new recipes.  She has also obtained more veg's, usually succeeding in meeting her veg goal of 2 veg servings/day.   Recent physical activity: Has met goal of 180 min/week over at least 3 sessions until this week.    Intervention: Reviewed Alesia's progress on goals, and talked at length re. The  potential effects of greater self-compassion on success in achieving behavior change.    For goals and recommendations, see Patient Instructions.   Follow-up: 2 weeks via MyChart video visit.    SYKES,JEANNIE 

## 2019-06-22 ENCOUNTER — Other Ambulatory Visit: Payer: Self-pay

## 2019-06-22 ENCOUNTER — Ambulatory Visit (INDEPENDENT_AMBULATORY_CARE_PROVIDER_SITE_OTHER): Payer: 59 | Admitting: Family Medicine

## 2019-06-22 DIAGNOSIS — E785 Hyperlipidemia, unspecified: Secondary | ICD-10-CM

## 2019-06-22 DIAGNOSIS — Z713 Dietary counseling and surveillance: Secondary | ICD-10-CM

## 2019-06-22 NOTE — Progress Notes (Signed)
Telehealth Encounter I connected with Rose Bell (MRN [id number]) on 06/22/2019 by Doxy.me video-enabled, HIPAA-compliant telemedicine application, verified that I am speaking with the correct person using two identifiers, and that the patient was in a private environment conducive to confidentiality.  I discussed the limitations of evaluation and management by telemedicine. The patient expressed understanding and agreed to proceed.  Provider was Kennith Center, PhD, RD, LDN, CEDRD Provider(s) located at home during this telehealth encounter; patient was at home  Appt start time: 1500 end time: 1600 (1 hour)  Reason for telehealth visit: Medical Nutrition Therapy for weight management and HLD. (Preventive Med visit; Dietary Counseling and Surveillance.)  Relevant history/background: Rose Bell feels she's been working on wt loss "forever," and has seldom realized success.  Also concerned re. h/o hypercholesterolemia.    Assessment: Rose Bell took the validated Self-compassion assessment (self-compassion.org), and reported her scores (see below).  Scores suggest that she is very harsh on herself, that she tends to identify with her shortcomings rather than celebrate her strengths, and that she does not practice much self-kindness.  Since taking the assessment, watching some of the videos at the site, and speaking with me via phone a couple weeks ago, Rose Bell has been more mindful, and has managed to reframe self-talk at least a couple times.   Recent physical activity: Has met ex goal of 180 min/week until last week (no ex).  She just felt unmotivated to exercise, and did not know why.   Veg's goal and new recipe/week: Has been doing well, using food box delivery.    Intervention: Reviewed and explained Rose Bell's self-compassion scores, and identified ways in which she might  learn greater self-compassion.  Also reviewed goals progress, and suggested ways to improve follow-through.   Overall Rose Bell score (out of 5):  2.08  Self-kindness          1.20    Judgment   4.20 Common humanity 1.5      Isolation      2.25 Mindfulness             2.5     Over-ident   4.25  For goals and recommendations, see Patient Instructions.   Follow-up: 3-4 weeks via [email or web address] video visit.    Johney Perotti,JEANNIE

## 2019-06-22 NOTE — Patient Instructions (Addendum)
-   5-min rule of exercise: Promise yourself you'll exercise at least 5 minutes, and allow yourself to quit at 5 minutes if you still want to.  - Schedule exercise together with your daughter.  Consider also any other possible exercise partners.   - Schedule exercise times at the beginning of each week.  Consistent workout times each week usually make it a lot easier to follow through with your exercise intentions.   - Design a number of resistance workouts on index cards, which you can use any time.    - Design dinner meals you can use as your "go-to" meals, using the Meal Planning form previously provided.  Use this as a basis for shopping, so you will always have ingredients on hand to make any of the meals on your list.  Ask for input from your son and husband.    Learning self-compassion:  Continue to work on awareness; catch yourself in negative self-talk.   - Reminder:  What does it mean to over-identify with the negative (i.e., mistakes, screw-ups, errors)?   - Keep a "success journal": Write down at least one thing from your day that you've done well, and feel good about.   - Register for [facility] Self-compassion WebEx talk on Sept 24 at 5:30 PM.   Goals:  1.Exercise at least 180 minutes per week, to be distributed over at least three different exercise times. 2. Include at least two vegetable servings per day 6 X  week. One serving = 1/2 cup cooked or 1 cup salad.  3. Try at least one new recipe per week.  Complete your (modified) goals sheet, and have it handy for our follow-up appt on Monday, Sept 28 at 3 PM at: [email or web address]

## 2019-07-17 ENCOUNTER — Ambulatory Visit (INDEPENDENT_AMBULATORY_CARE_PROVIDER_SITE_OTHER): Payer: 59 | Admitting: Family Medicine

## 2019-07-17 ENCOUNTER — Other Ambulatory Visit: Payer: Self-pay

## 2019-07-17 DIAGNOSIS — Z713 Dietary counseling and surveillance: Secondary | ICD-10-CM

## 2019-07-17 NOTE — Patient Instructions (Addendum)
Why exercise is importantto you: - To better manage weight and cholesterol.   - Afternoon exercise gives energy in the evening.    Keep in mind that you don't want to be dependent on someone else for your motivation!    Goals: 1. Exercise at least 180 minutes per week.      Every weekend, SCHEDULE your exercise on your Outlook calendar.       Remember our conversation about earning your motivation as well as the 5-min rule.       Also: Put out your walking clothes out at the start of the day.  And consider what podcast, music, or phone call you'll have during your walk.    2. Include at least two vegetable servings per dinner days 6 per week.   Document your progress on your Goals Sheet provided today.    Email Jeannie no later than Saturday to let me know how many minutes you walked this week.    Follow-up: [email or web address] on Thursday, 08-10-19 at 3 PM.

## 2019-07-17 NOTE — Progress Notes (Signed)
Telehealth Encounter I connected with Rose Bell (MRN [id number]) on 07/17/2019 by Doxy.me video-enabled, HIPAA-compliant telemedicine application, verified that I am speaking with the correct person using two identifiers, and that the patient was in a private environment conducive to confidentiality.  I discussed the limitations of evaluation and management by telemedicine. The patient expressed understanding and agreed to proceed.  Provider was Kennith Center, PhD, RD, LDN, CEDRD Provider(s) located at [facility] during this telehealth encounter; patient was at home  Appt start time: 1500 end time: 1600 (1 hour)  Reason for telehealth visit: Medical Nutrition Therapy for weight management and HLD. (Preventive Med visit; Dietary Counseling and Surveillance.)  Relevant history/background: Rose Bell feels she's been working on wt loss "forever," and has seldom realized success.  Also concerned re. h/o hypercholesterolemia.    Assessment:  Rose Bell said at the outset of today's appt that she has not made any progress on her goals.  She has started a new major project at work that has been very draining, although it has not meant more actual work hours for her.  She can't think of anything other than this to explain her inability to exercise.   At last visit, Rose Bell committed to trying to catch herself in negative self-talk and to keeping a success journal.  Given Rose Bell's disappointment in her progress, we didn't get into a discussion of this today.   Recent physical activity: Has not been exercising at all.   Veg's goal and new recipe/week: Has been doing relatively well on each of these goals, but is not tracking specifically.    Intervention: Reviewed progress on behavioral goals, and talked about ways to improve motivation.    For goals and recommendations, see Patient Instructions.   Follow-up: 3 weeks via [email or web address] video visit.    Bell,Rose

## 2019-08-08 ENCOUNTER — Ambulatory Visit (INDEPENDENT_AMBULATORY_CARE_PROVIDER_SITE_OTHER): Payer: 59 | Admitting: Family Medicine

## 2019-08-08 ENCOUNTER — Other Ambulatory Visit: Payer: Self-pay

## 2019-08-08 DIAGNOSIS — Z713 Dietary counseling and surveillance: Secondary | ICD-10-CM

## 2019-08-08 NOTE — Patient Instructions (Addendum)
EXERCISE - Schedule all your exercise on your Outlook calendar for now.  (No need to do so once this becomes routine.)  Do this every Friday for your upcoming week.  Set expectations with your daughter on days she is coming over, and you want to walk with her.   - Consider getting a blank book to record workouts.  Also, on one of the back pages, make a list of exercises to give you ideas for workouts you can design.   - Congrat's for scheduling with a personal trainer!  For every meal, consider how balanced it is, and if you can improve on it:  - Remember to avoid the all-or-none thinking for dinners:  You can always heat up a frozen vegetable or make a quick salad to go with take-out.    Specific goals remain the same: 1. Exercise at least 180 minutes per week.  2. Include at least two vegetable servings per day 6 per week.   And miscellaneous thoughts:  - Consider making a smoothie for breakfast.  Getting a source of both protein and carbohydrate is especially important soon after a hard workout like what you may have with Joni on Friday mornings.   - When you are disappointed in not following through with intended behavior, try to replace feelings of shame with CURIOSITY.  Remember the (opposite) visions of each of these, and draw on these images at those times.    Follow-up appt is Thursday, Nov 12 at 10:30 AM via [email or web address].

## 2019-08-08 NOTE — Progress Notes (Signed)
Telehealth Encounter I connected with Rose Bell (MRN [id number]) on 08/08/2019 by Doxy.me video-enabled, HIPAA-compliant telemedicine application, verified that I am speaking with the correct person using two identifiers, and that the patient was in a private environment conducive to confidentiality.  I discussed the limitations of evaluation and management by telemedicine. The patient expressed understanding and agreed to proceed.  Provider was Kennith Center, PhD, RD, LDN, CEDRD Provider(s) located at [facility] during this telehealth encounter; patient was at home  Appt start time: 4163 end time: 8453 (1 hour)  Reason for telehealth visit: Medical Nutrition Therapy for weight management and HLD. (Preventive Med visit; Dietary Counseling and Surveillance.)  Relevant history/background: Rose Bell feels she's been working on wt loss "forever," and has seldom realized success.  Also concerned re. h/o hypercholesterolemia.    Assessment:  Recent physical activity: Has not met goals, but is making progress.  She has scheduled a personal coach for 30 minutes on Friday mornings.  Rose Bell emailed a progress report a couple weeks after last appt.  She was again very self-critical that she still hadn't exercised much: 140 min last week, but 73 min the previous week.  When asked about obstacles to exercise, she said she is usually allowing work or family obligations to get in the way.   Veg's goal and new recipe/week: Rose Bell has had 2 veg's per day only twice last week.    Intervention: Reviewed progress on behavioral goals, and discussed specific strategies for following through with her goals.  Spent a good amt of time talking about replacing negative self-talk with analysis (curiosity), which is usually more productive.    For goals and recommendations, see Patient Instructions.   Follow-up: 3 weeks via [email or web address] video visit.    Rose Bell,Rose Bell

## 2019-08-10 ENCOUNTER — Telehealth: Payer: 59 | Admitting: Family Medicine

## 2019-08-15 ENCOUNTER — Encounter: Payer: Self-pay | Admitting: Family Medicine

## 2019-08-31 ENCOUNTER — Ambulatory Visit (INDEPENDENT_AMBULATORY_CARE_PROVIDER_SITE_OTHER): Payer: 59 | Admitting: Family Medicine

## 2019-08-31 ENCOUNTER — Other Ambulatory Visit: Payer: Self-pay

## 2019-08-31 DIAGNOSIS — Z713 Dietary counseling and surveillance: Secondary | ICD-10-CM

## 2019-08-31 NOTE — Progress Notes (Signed)
Telehealth Encounter I connected with Kristyanna Mcnemar (MRN [id number]) on 08/31/2019 by Doxy.me video-enabled, HIPAA-compliant telemedicine application, verified that I am speaking with the correct person using two identifiers, and that the patient was in a private environment conducive to confidentiality.  The patient agreed to proceed.  Provider was Kennith Center, PhD, RD, LDN, CEDRD Provider(s) located at [facility] during this telehealth encounter; patient was at home  Appt start time: 0900 end time: 0945 (45 minutes)  Reason for telehealth visit: Medical Nutrition Therapy for weight management and HLD. (Preventive Med visit; Dietary Counseling and Surveillance.)  Relevant history/background: Aalya feels she's been working on wt loss "forever," and has seldom realized success.  Also concerned re. h/o hypercholesterolemia.    Assessment: Ismay has been struggling with stresses related to work, which impacts choices she makes with respect to self-care.  She has put no thought into planning for either food or exercise this week, feeling overwhelmed.  She did get a notebook to document workouts in, however, and has continued Friday sessions with her personal trainer.  She has done some of the exercises the trainer gave her on the weekend as well.   Recent physical activity: Jocee has noticed a pattern that she has 1 or 2 weeks of good progress, followed by weeks when she especially struggles.  She considered cancelling today's appt, disappointed that she had not progressed as hoped with her behavioral goals.   Veg's goal: Did achieve this goal yesterday, but not in the past week.    24-hr recall:  (Up at 7 AM; had glass of water) B (11 AM)-  2 [place] & cheese corn quesadilla, water Snk ( AM)-  --- L (2 PM)-  2 [place] & cheese corn quesadilla, water Snk (6 PM)-  12 oz diet soda D (7 PM)-  1 large salad, chs, [place], dressing Snk (10 PM)-  [employer] 4 fries, chx nuggets Typical day? Yes.   except evening snack at [facility].    Intervention: Acknowledged the tremendous impact stress can have on choices we make, and encouraged Amaliya to make meal times and self-care a priority to the extent she can.  Confirmed that she wants to continue working on the previously determined goals.   For goals and recommendations, see Patient Instructions.   Follow-up: 3 weeks via [email or web address] video visit.    SYKES,JEANNIE

## 2019-08-31 NOTE — Patient Instructions (Addendum)
-   Watch Amy Cuddy's TED talk on body language.   - Make mealtimes a priority starting with breakfast, even if breakfast is small.    - Keep frozen veg's on hand.    - Schedule exercise on the calendar.    - Complete the Meal Planning form, and use as a basis for shopping.  This will  help with decision-making at mealtimes.    Goals remain the same:   1. Get at least 180 min of exercise weekly.  (Plan your exercise time; put it on the calendar!) 2. Obtain at least 2 vegetable servings per day 6 days a week.    Congratulations on getting your notebook for recording workouts!  Be sure to include all your exercise, not just what you do on Fridays with your personal trainer.    Follow-up appt in 3 weeks via [email or web address] on Tuesday, 12/1 at 4 PM.

## 2019-09-09 ENCOUNTER — Encounter: Payer: Self-pay | Admitting: Emergency Medicine

## 2019-09-09 ENCOUNTER — Emergency Department (INDEPENDENT_AMBULATORY_CARE_PROVIDER_SITE_OTHER): Payer: 59

## 2019-09-09 ENCOUNTER — Emergency Department (INDEPENDENT_AMBULATORY_CARE_PROVIDER_SITE_OTHER)
Admission: EM | Admit: 2019-09-09 | Discharge: 2019-09-09 | Disposition: A | Discharge status: Home / Self Care | Payer: 59 | Source: Home / Self Care | Attending: Family Medicine | Admitting: Family Medicine

## 2019-09-09 ENCOUNTER — Other Ambulatory Visit: Payer: Self-pay

## 2019-09-09 DIAGNOSIS — W19XXXA Unspecified fall, initial encounter: Secondary | ICD-10-CM

## 2019-09-09 DIAGNOSIS — S62623A Displaced fracture of medial phalanx of left middle finger, initial encounter for closed fracture: Secondary | ICD-10-CM

## 2019-09-09 DIAGNOSIS — S62653A Nondisplaced fracture of medial phalanx of left middle finger, initial encounter for closed fracture: Secondary | ICD-10-CM | POA: Diagnosis not present

## 2019-09-09 MED ORDER — IBUPROFEN 600 MG PO TABS: 600.0000 mg | ORAL_TABLET | Freq: Once | ORAL | Status: DC

## 2019-09-09 NOTE — ED Provider Notes (Signed)
[facility]    CSN: [id number] Arrival date & time: 09/09/19  1044      History   Chief Complaint Chief Complaint  Patient presents with  . Hand Injury    left middle finger    HPI Rose Bell is a 38 y.o. female.   Patient fell last night landing on her left middle finger.  She has had persistent pain, swelling, and decreased range of motion of her finger  The history is provided by the patient.  Hand Pain This is a new problem. The current episode started yesterday. The problem occurs constantly. The problem has not changed since onset.Exacerbated by: finger movement. Nothing relieves the symptoms. Treatments tried: splint. The treatment provided no relief.    Past Medical History:  Diagnosis Date  . Hyperlipemia   . Hypertension     Patient Active Problem List   Diagnosis Date Noted  . Orgasmic headache 01/08/2016  . Obesity (BMI 30.0-34.9) 01/02/2015  . Hyperlipidemia 08/10/2014  . GERD (gastroesophageal reflux disease) 03/04/2012  . General medical examination 06/26/2011  . Screening for malignant neoplasm of the cervix 06/26/2011  . Vitamin D deficiency 05/30/2009  . Anxiety state 05/30/2009  . B12 deficiency 05/23/2009    History reviewed. No pertinent surgical history.  OB History   No obstetric history on file.      Home Medications    Prior to Admission medications   Medication Sig Start Date End Date Taking? Authorizing Provider  cetirizine (ZYRTEC) 10 MG tablet Take 10 mg by mouth daily.    [provider]  Cholecalciferol (VITAMIN D) 2000 units CAPS Take 1 capsule by mouth daily.    [provider]  Cyanocobalamin (B-12 PO) Take 1 tablet by mouth daily.    [provider]  diphenhydrAMINE (BENADRYL ALLERGY) 25 mg capsule Take 25 mg by mouth every 8 (eight) hours as needed.    [provider]  fluticasone (FLONASE) 50 MCG/ACT nasal spray Place into both nostrils daily.    [provider]  montelukast (SINGULAIR) 10 MG tablet Take 1 tablet (10 mg total) by mouth at bedtime. Patient not taking: Reported on 05/16/2019 11/17/17   Barnet Glasgow, NP  Vitamin D, Ergocalciferol, (DRISDOL) 1.25 MG (50000 UT) CAPS capsule Take 1 capsule (50,000 Units total) by mouth every 7 (seven) days. Patient not taking: Reported on 05/16/2019 12/02/18   Midge Minium, MD    Family History Family History  Problem Relation Age of Onset  . Diabetes Mother   . Hypertension Mother   . Hypothyroidism Mother   . Fibroids Mother   . Diabetes Other        father side of family    Social History Social History   Tobacco Use  . Smoking status: Never Smoker  . Smokeless tobacco: Never Used  Substance Use Topics  . Alcohol use: Yes    Comment: socially  . Drug use: No     Allergies   Phentermine and Bupropion   Review of Systems Review of Systems  Musculoskeletal:       Left third finger pain/swelling  Skin: Negative for color change.  All other systems reviewed and are negative.    Physical Exam Triage Vital Signs ED Triage Vitals  Enc Vitals Group     BP 09/09/19 1148 (!) 142/90     Pulse Rate 09/09/19 1148 (!) 101     Resp 09/09/19 1148 18     Temp 09/09/19 1148 98.5 F (36.9 C)  Temp Source 09/09/19 1148 Oral     SpO2 09/09/19 1148 98 %     Weight 09/09/19 1150 191 lb 12.8 oz (87 kg)     Height 09/09/19 1150 5\' 3"  (1.6 m)     Head Circumference --      Peak Flow --      Pain Score 09/09/19 1149 4     Pain Loc --      Pain Edu? --      Excl. in [place]? --    No data found.  Updated Vital Signs BP (!) 142/90 (BP Location: Right Arm)   Pulse (!) 101   Temp 98.5 F (36.9 C) (Oral)   Resp 18   Ht 5\' 3"  (1.6 m)   Wt 87 kg   LMP 08/09/2019 (Approximate)   SpO2 98%   BMI 33.98 kg/m   Visual Acuity Right Eye Distance:   Left Eye Distance:   Bilateral Distance:    Right Eye Near:   Left Eye Near:    Bilateral Near:     Physical Exam Vitals signs  and nursing note reviewed.  Constitutional:      General: She is not in acute distress.    Appearance: She is obese.  HENT:     Head: Atraumatic.  Eyes:     Pupils: Pupils are equal, round, and reactive to light.  Pulmonary:     Effort: Pulmonary effort is normal.  Musculoskeletal:     Left hand: She exhibits tenderness, bony tenderness and swelling. She exhibits normal two-point discrimination, normal capillary refill, no deformity and no laceration.       Hands:     Comments: Mild swelling and diffuse tenderness over the middle phalanx of the left third finger.  Distal neurovascular function is intact.  Finger has decreased range of motion but flexion/extension is present at the PIP an DIP joints.  Skin:    General: Skin is dry.  Neurological:     Mental Status: She is alert.      UC Treatments / Results  Labs (all labs ordered are listed, but only abnormal results are displayed) Labs Reviewed - No data to display  EKG   Radiology Dg Finger Middle Left  Result Date: 09/09/2019 CLINICAL DATA:  Fall last night with left third finger pain EXAM: LEFT MIDDLE FINGER 2+V COMPARISON:  None. FINDINGS: Comminuted non articular middle phalanx fracture in the left third finger, involving the mid to distal shaft, with surrounding soft tissue swelling without significant displacement. No additional fracture. No dislocation. No suspicious focal osseous lesions. No significant arthropathy. No radiopaque foreign body. IMPRESSION: Comminuted non articular middle phalanx fracture in the left third finger without significant displacement. Electronically Signed   By: Ilona Sorrel M.D.   On: 09/09/2019 12:07    Procedures Procedures (including critical care time)  Medications Ordered in UC Medications  ibuprofen (ADVIL) tablet 600 mg (has no administration in time range)    Initial Impression / Assessment and Plan / UC Course  I have reviewed the triage vital signs and the nursing notes.   Pertinent labs & imaging results that were available during my care of the patient were reviewed by me and considered in my medical decision making (see chart for details).    Finger splint applied.  Followup with Dr. Aundria Mems ([facility]) in about 10 days.   Final Clinical Impressions(s) / UC Diagnoses   Final diagnoses:  Closed nondisplaced fracture of middle phalanx of left middle  finger, initial encounter     Discharge Instructions     Apply ice pack for about 10 minutes, 3 to 4 times daily  Continue until pain and swelling decrease.  Wear finger splint.  May take Tylenol as needed for pain.    ED Prescriptions    None        Kandra Nicolas, MD 09/11/19 1112

## 2019-09-09 NOTE — Discharge Instructions (Addendum)
Apply ice pack for about 10 minutes, 3 to 4 times daily  Continue until pain and swelling decrease.  Wear finger splint.  May take Tylenol as needed for pain.

## 2019-09-09 NOTE — ED Triage Notes (Signed)
Patient fell last night and landed on left middle finger; she has it splinted; bruising and edema noted.No OTC for pain. She had influenza vacc this season. She has not been in contact with known covid positive patient nor has she travelled in past 4 weeks.

## 2019-09-19 ENCOUNTER — Ambulatory Visit (INDEPENDENT_AMBULATORY_CARE_PROVIDER_SITE_OTHER): Payer: 59 | Admitting: Family Medicine

## 2019-09-19 ENCOUNTER — Other Ambulatory Visit: Payer: Self-pay

## 2019-09-19 DIAGNOSIS — Z713 Dietary counseling and surveillance: Secondary | ICD-10-CM

## 2019-09-19 NOTE — Progress Notes (Signed)
Telehealth Encounter I connected with Rose Bell (MRN [id number]) on 09/19/2019 by Doxy.me video-enabled, HIPAA-compliant telemedicine application, verified that I am speaking with the correct person using two identifiers, and that the patient was in a private environment conducive to confidentiality.  The patient agreed to proceed.  Provider was Kennith Center, PhD, RD, LDN, CEDRD Provider(s) located at [facility] during this telehealth encounter; patient was at home  Appt start time: 1600 end time: 1700 (1 hour)  Reason for telehealth visit: Medical Nutrition Therapy for weight management and HLD. (Preventive Med visit; Dietary Counseling and Surveillance.)  Relevant history/background: Rose Bell feels she's been working on wt loss "forever," and has seldom realized success.  Also concerned re. h/o hypercholesterolemia.    Assessment: Rose Bell has been dealing with a lot of changes at work, which has been stressful.  She did not keep track of last appt's recommendations, so forgot to follow through on planning exercise times or to use the Meal Planning form.   Breakfast: Has not managed to eat breakfast consistently.   Vegetables: Has stocked up on frozen vegetables, but has not consistently incorporated to meals.   Exercise: Has not exercised most days; missed last week's personal trainer b/c of holiday, and trainer will be out of town this week.   Intervention: Reviewed progress on goals in recent weeks, and discussed ways to increase success in follow-through, emphasizing advance planning.      For goals and recommendations, see Patient Instructions.   Follow-up: 3 weeks via Doxy.me video visit.    SYKES,JEANNIE

## 2019-09-19 NOTE — Patient Instructions (Addendum)
-   Reminder: Watch Amy Cuddy's TED talk on body language.   - Make a plan for your usual weekday schedule for meals and break times.    - Complete the Meal Planning form, and use as a basis for shopping.  This will  help with decision-making at mealtimes.    Goals:   1. Get breakfast daily.  This may be just a scoop of protein powder in almond milk, to which you add coffee.   2. Obtain at least 2 vegetable servings per day 6 days a week.   3. Get at least 180 min of exercise weekly.  Plan your exercise time; put it on the calendar!     In addition to planning WHEN you'll exercise, plan ahead for WHAT you'll do.  This     means using planned workouts.    Follow-up appt in 3 weeks via [email or web address] on Tuesday, 12/21 at 11:30 AM.

## 2019-09-26 ENCOUNTER — Other Ambulatory Visit: Payer: Self-pay

## 2019-09-26 ENCOUNTER — Encounter: Payer: Self-pay | Admitting: Sports Medicine

## 2019-09-26 ENCOUNTER — Ambulatory Visit (INDEPENDENT_AMBULATORY_CARE_PROVIDER_SITE_OTHER): Payer: 59

## 2019-09-26 ENCOUNTER — Ambulatory Visit (INDEPENDENT_AMBULATORY_CARE_PROVIDER_SITE_OTHER): Payer: 59 | Admitting: Sports Medicine

## 2019-09-26 DIAGNOSIS — S62629A Displaced fracture of medial phalanx of unspecified finger, initial encounter for closed fracture: Secondary | ICD-10-CM | POA: Insufficient documentation

## 2019-09-26 DIAGNOSIS — S62653A Nondisplaced fracture of medial phalanx of left middle finger, initial encounter for closed fracture: Secondary | ICD-10-CM

## 2019-09-26 DIAGNOSIS — S62603A Fracture of unspecified phalanx of left middle finger, initial encounter for closed fracture: Secondary | ICD-10-CM | POA: Diagnosis not present

## 2019-09-26 NOTE — Assessment & Plan Note (Signed)
Fracture about 3 weeks ago. Not really tender over the fracture anymore, buddy tape the third and fourth fingers together, repeat x-rays today Patient has analgesia. Return to see me in 3 weeks no further x-rays needed.  I billed a fracture code for this encounter, all subsequent visits will be post-op checks in the global period.

## 2019-09-26 NOTE — Progress Notes (Signed)
Subjective:    CC: Finger injury  HPI:  This is a pleasant 38 year old female, 3 weeks ago she injured her left middle finger, she had immediate pain, swelling, bruising, was seen in urgent care, a spiral fracture through the third middle phalanx was noted, she was placed in a splint and referred to me for further evaluation and definitive treatment, pain is minimal.  Localized without radiation.  I reviewed the past medical history, family history, social history, surgical history, and allergies today and no changes were needed.  Please see the problem list section below in epic for further details.  Past Medical History: Past Medical History:  Diagnosis Date  . Hyperlipemia   . Hypertension    Past Surgical History: No past surgical history on file. Social History: Social History   Socioeconomic History  . Marital status: Married    Spouse name: Not on file  . Number of children: Not on file  . Years of education: Not on file  . Highest education level: Not on file  Occupational History  . Not on file  Social Needs  . Financial resource strain: Not on file  . Food insecurity    Worry: Not on file    Inability: Not on file  . Transportation needs    Medical: Not on file    Non-medical: Not on file  Tobacco Use  . Smoking status: Never Smoker  . Smokeless tobacco: Never Used  Substance and Sexual Activity  . Alcohol use: Yes    Comment: socially  . Drug use: No  . Sexual activity: Yes  Lifestyle  . Physical activity    Days per week: Not on file    Minutes per session: Not on file  . Stress: Not on file  Relationships  . Social Herbalist on phone: Not on file    Gets together: Not on file    Attends religious service: Not on file    Active member of club or organization: Not on file    Attends meetings of clubs or organizations: Not on file    Relationship status: Not on file  Other Topics Concern  . Not on file  Social History Narrative  . Not  on file   Family History: Family History  Problem Relation Age of Onset  . Diabetes Mother   . Hypertension Mother   . Hypothyroidism Mother   . Fibroids Mother   . Diabetes Other        father side of family   Allergies: Allergies  Allergen Reactions  . Phentermine Swelling    After 3 days pt says tongue swells and was having trouble with speech  . Bupropion    Medications: See med rec.  Review of Systems: No headache, visual changes, nausea, vomiting, diarrhea, constipation, dizziness, abdominal pain, skin rash, fevers, chills, night sweats, swollen lymph nodes, weight loss, chest pain, body aches, joint swelling, muscle aches, shortness of breath, mood changes, visual or auditory hallucinations.  Objective:    General: Well Developed, well nourished, and in no acute distress.  Neuro: Alert and oriented x3, extra-ocular muscles intact, sensation grossly intact.  HEENT: Normocephalic, atraumatic, pupils equal round reactive to light, neck supple, no masses, no lymphadenopathy, thyroid nonpalpable.  Skin: Warm and dry, no rashes noted.  Cardiac: Regular rate and rhythm, no murmurs rubs or gallops.  Respiratory: Clear to auscultation bilaterally. Not using accessory muscles, speaking in full sentences.  Abdominal: Soft, nontender, nondistended, positive bowel sounds, no masses,  no organomegaly.  Left hand: Only minimal tenderness over the middle phalanx, good motion passively and actively.  Third and fourth digits were buddy taped together.  Repeat x-rays showed little if no additional displacement.  Impression and Recommendations:    The patient was counselled, risk factors were discussed, anticipatory guidance given.  Fracture of finger, middle phalanx, left, closed Fracture about 3 weeks ago. Not really tender over the fracture anymore, buddy tape the third and fourth fingers together, repeat x-rays today Patient has analgesia. Return to see me in 3 weeks no further  x-rays needed.  I billed a fracture code for this encounter, all subsequent visits will be post-op checks in the global period.   ___________________________________________ Gwen Her. Dianah Field, M.D., ABFM., CAQSM. Primary Care and Sports Medicine [facility]  Adjunct Professor of [employer] [facility] [employer]

## 2019-10-09 ENCOUNTER — Other Ambulatory Visit: Payer: Self-pay

## 2019-10-09 ENCOUNTER — Ambulatory Visit (INDEPENDENT_AMBULATORY_CARE_PROVIDER_SITE_OTHER): Payer: 59 | Admitting: Family Medicine

## 2019-10-09 DIAGNOSIS — E785 Hyperlipidemia, unspecified: Secondary | ICD-10-CM

## 2019-10-09 DIAGNOSIS — Z713 Dietary counseling and surveillance: Secondary | ICD-10-CM | POA: Diagnosis not present

## 2019-10-09 NOTE — Patient Instructions (Addendum)
Exercise: Reminder - use at least a short warmup before you exercise.    Food choices:  - Use your own agency to make the best choices for YOU.   What's your equivalent of the "Exercise 5-min rule"?  For example, No matter what I choose, what one especially healthy choice can be part of this meal?  Goals remain the same:  1. Get breakfast daily.  This may be just a scoop of protein powder in almond milk, to which you add coffee.   2. Obtain at least 2 vegetable servings per day 6 days a week.  3. Get at least 180 min of exercise weekly, using 15-minute increments at a time.  Document in your notebook when you exercise.  Follow-up:  Telehealth visit on Thursday, 11/02/19 at 11 AM on [email or web address].

## 2019-10-09 NOTE — Progress Notes (Signed)
Telehealth Encounter I connected with Rose Bell (MRN [id number]) on 10/09/2019 by [email or web address] video-enabled, HIPAA-compliant telemedicine application, verified that I am speaking with the correct person using two identifiers, and that the patient was in a private environment conducive to confidentiality.  The patient agreed to proceed.  Provider was Kennith Center, PhD, RD, LDN, CEDRD Provider(s) located at [facility] during this telehealth encounter; patient was at home  Appt start time: 1130 end time: 1230 (1 hour)  Reason for telehealth visit: Medical Nutrition Therapy for weight management and HLD. (Preventive Med visit; Dietary Counseling and Surveillance.)  Relevant history/background: Rose Bell feels she's been working on wt loss "forever," and has seldom realized success.  Also concerned re. h/o hypercholesterolemia.    Assessment: Although not yet where she wants to be, Rose Bell has made some progress on her goals.   Breakfast: Has eaten breakfast an average of 3 X wk.    Vegetables 2 svngs/day: Has been meeting goal.   Exercise: Has been meeting with her personal trainer once a week, and has tracked her goals in the notebook she uses to record personal trainer workouts.  Has not exercised much otherwise, but did a 15-min HIIT workout on her own this morning.   24-hr recall:  (Up at 9:30 AM) B ( AM)-  --- Snk ( AM)-  water L (12 PM)-  16 oz [employer] coffee, 2 pumps syrup, lite cream Snk (3:30)-  4 wings, 24 oz beer, 1 c fries D (7 PM)-  1 c broccoli, 2 c chx alfredo, 12 oz beer Snk ( PM)-  water Typical day? No.  Martin Majestic out shopping with family yesterday.    Intervention: Reviewed progress on goals in recent weeks, congratulated pt on progress so far, and explored ways to improve follow-through.    For goals and recommendations, see Patient Instructions.   Follow-up: Telehealth visit in 3 weeks.     Rose Bell,JEANNIE

## 2019-10-10 ENCOUNTER — Encounter: Payer: Self-pay | Admitting: Family Medicine

## 2019-10-17 ENCOUNTER — Ambulatory Visit (INDEPENDENT_AMBULATORY_CARE_PROVIDER_SITE_OTHER): Payer: 59 | Admitting: Sports Medicine

## 2019-10-17 ENCOUNTER — Encounter: Payer: Self-pay | Admitting: Sports Medicine

## 2019-10-17 ENCOUNTER — Other Ambulatory Visit: Payer: Self-pay

## 2019-10-17 ENCOUNTER — Ambulatory Visit (INDEPENDENT_AMBULATORY_CARE_PROVIDER_SITE_OTHER): Payer: 59

## 2019-10-17 DIAGNOSIS — S62653D Nondisplaced fracture of medial phalanx of left middle finger, subsequent encounter for fracture with routine healing: Secondary | ICD-10-CM | POA: Diagnosis not present

## 2019-10-17 DIAGNOSIS — S62603A Fracture of unspecified phalanx of left middle finger, initial encounter for closed fracture: Secondary | ICD-10-CM | POA: Diagnosis not present

## 2019-10-17 NOTE — Assessment & Plan Note (Signed)
Fracture of the third middle phalanx on the left, 6 weeks out, doing much better. She has good motion, still a bit of tenderness distally and swelling. Repeat x-rays today. She can start to use the finger more, we will do a virtual visit in 6 weeks.

## 2019-10-17 NOTE — Progress Notes (Signed)
  Subjective: Rose Bell returns, she is now 6 weeks post nondisplaced spiral fracture through the left third middle phalanx, overall doing much better.  Objective: General: Well-developed, well-nourished, and in no acute distress. Left hand: Minimal tenderness distally, but the majority of the middle phalanx is nontender, good motion, neurovascular intact distally.  Assessment/plan:   Fracture of finger, middle phalanx, left, closed Fracture of the third middle phalanx on the left, 6 weeks out, doing much better. She has good motion, still a bit of tenderness distally and swelling. Repeat x-rays today. She can start to use the finger more, we will do a virtual visit in 6 weeks.    ___________________________________________ Gwen Her. Dianah Field, M.D., ABFM., CAQSM. Primary Care and Sports Medicine [facility]  Adjunct Professor of [place] of [facility] [employer]

## 2019-10-19 DIAGNOSIS — U071 COVID-19: Secondary | ICD-10-CM | POA: Diagnosis not present

## 2019-10-19 DIAGNOSIS — Z03818 Encounter for observation for suspected exposure to other biological agents ruled out: Secondary | ICD-10-CM | POA: Diagnosis not present

## 2019-10-19 DIAGNOSIS — Z20828 Contact with and (suspected) exposure to other viral communicable diseases: Secondary | ICD-10-CM | POA: Diagnosis not present

## 2019-10-25 ENCOUNTER — Ambulatory Visit (HOSPITAL_COMMUNITY)
Admission: EM | Admit: 2019-10-25 | Discharge: 2019-10-25 | Disposition: A | Discharge status: Nursing Home (Includes ICF) | Payer: 59 | Source: Home / Self Care

## 2019-10-25 ENCOUNTER — Emergency Department (HOSPITAL_COMMUNITY)
Admission: EM | Admit: 2019-10-25 | Discharge: 2019-10-25 | Discharge status: Against Medical Advice | Payer: 59 | Source: Home / Self Care

## 2019-10-25 ENCOUNTER — Encounter: Payer: Self-pay | Admitting: Family Medicine

## 2019-10-25 ENCOUNTER — Encounter (HOSPITAL_COMMUNITY): Payer: Self-pay

## 2019-10-25 ENCOUNTER — Ambulatory Visit: Payer: 59 | Admitting: Physician Assistant

## 2019-10-25 ENCOUNTER — Telehealth (HOSPITAL_COMMUNITY): Payer: Self-pay | Admitting: Urgent Care

## 2019-10-25 ENCOUNTER — Other Ambulatory Visit: Payer: Self-pay

## 2019-10-25 DIAGNOSIS — R000 Tachycardia, unspecified: Secondary | ICD-10-CM

## 2019-10-25 DIAGNOSIS — E785 Hyperlipidemia, unspecified: Secondary | ICD-10-CM | POA: Diagnosis not present

## 2019-10-25 DIAGNOSIS — J1282 Pneumonia due to coronavirus disease 2019: Secondary | ICD-10-CM | POA: Diagnosis not present

## 2019-10-25 DIAGNOSIS — R0902 Hypoxemia: Secondary | ICD-10-CM

## 2019-10-25 DIAGNOSIS — U071 COVID-19: Secondary | ICD-10-CM

## 2019-10-25 DIAGNOSIS — R42 Dizziness and giddiness: Secondary | ICD-10-CM | POA: Diagnosis not present

## 2019-10-25 DIAGNOSIS — R319 Hematuria, unspecified: Secondary | ICD-10-CM | POA: Diagnosis not present

## 2019-10-25 DIAGNOSIS — Z888 Allergy status to other drugs, medicaments and biological substances status: Secondary | ICD-10-CM | POA: Diagnosis not present

## 2019-10-25 DIAGNOSIS — R509 Fever, unspecified: Secondary | ICD-10-CM

## 2019-10-25 DIAGNOSIS — R05 Cough: Secondary | ICD-10-CM

## 2019-10-25 DIAGNOSIS — K219 Gastro-esophageal reflux disease without esophagitis: Secondary | ICD-10-CM | POA: Diagnosis not present

## 2019-10-25 DIAGNOSIS — R21 Rash and other nonspecific skin eruption: Secondary | ICD-10-CM | POA: Diagnosis not present

## 2019-10-25 DIAGNOSIS — R059 Cough, unspecified: Secondary | ICD-10-CM

## 2019-10-25 DIAGNOSIS — R918 Other nonspecific abnormal finding of lung field: Secondary | ICD-10-CM | POA: Diagnosis not present

## 2019-10-25 DIAGNOSIS — I10 Essential (primary) hypertension: Secondary | ICD-10-CM | POA: Diagnosis not present

## 2019-10-25 DIAGNOSIS — J9601 Acute respiratory failure with hypoxia: Secondary | ICD-10-CM | POA: Diagnosis not present

## 2019-10-25 MED ORDER — PROMETHAZINE-DM 6.25-15 MG/5ML PO SYRP: 5.0000 mL | ORAL_SOLUTION | Freq: Every evening | ORAL | 0 refills | Status: DC | PRN

## 2019-10-25 MED ORDER — ACETAMINOPHEN 325 MG PO TABS
ORAL_TABLET | ORAL | Status: AC
  Filled 2019-10-25: qty 3

## 2019-10-25 MED ORDER — ACETAMINOPHEN 325 MG PO TABS
975.0000 mg | ORAL_TABLET | Freq: Once | ORAL | Status: AC
  Administered 2019-10-25: 975 mg via ORAL

## 2019-10-25 MED ORDER — BENZONATATE 100 MG PO CAPS: 100.0000 mg | ORAL_CAPSULE | Freq: Three times a day (TID) | ORAL | 0 refills | Status: DC | PRN

## 2019-10-25 NOTE — Telephone Encounter (Signed)
FYI: Called patient and spoke to her after she was scheduled with Elyn Aquas, [facility] for 1:30p.  Patient tested positive for COVID on Thursday 10/19/19. She states her symptoms has progressively gotten worst. Today on the phone patient was coughing continuously, sob, having dizziness and lightheaded. Per Elyn Aquas, PAC, he would not be able to do anything for the dizziness by virtual visit. Advised patient to go to the ER or Urgent Care for evaluation. She is agreeable and headed there now.

## 2019-10-25 NOTE — ED Provider Notes (Signed)
[place]   MRN: [id number] DOB: [birth year]  Subjective:   Rose Bell is a 39 y.o. female presenting for persistent moderate to severe malaise.  Patient states that she has been worsening since she was diagnosed with COVID-19.  Has had significant difficulty with weakness, coughing, difficulty taking a deep breath because it elicits a cough.  She has not been able to take any medications.  Denies history of asthma, COPD.  Denies smoking history.  No current facility-administered medications for this encounter.  Current Outpatient Medications:  .  Cholecalciferol (VITAMIN D) 2000 units CAPS, Take 1 capsule by mouth daily., Disp: , Rfl:  .  Cyanocobalamin (B-12 PO), Take 1 tablet by mouth daily., Disp: , Rfl:    Allergies  Allergen Reactions  . Phentermine Swelling    After 3 days pt says tongue swells and was having trouble with speech  . Bupropion     Past Medical History:  Diagnosis Date  . Hyperlipemia   . Hypertension      No past surgical history on file.  Family History  Problem Relation Age of Onset  . Diabetes Mother   . Hypertension Mother   . Hypothyroidism Mother   . Fibroids Mother   . Diabetes Other        father side of family    Social History   Tobacco Use  . Smoking status: Never Smoker  . Smokeless tobacco: Never Used  Substance Use Topics  . Alcohol use: Yes    Comment: socially  . Drug use: No    ROS   Objective:   Vitals: BP 116/63 (BP Location: Left Arm)   Pulse (!) 120   Temp (!) 103 F (39.4 C) (Oral)   Resp 18   LMP 10/14/2019   SpO2 95%   On recheck, patient remains febrile, tachycardia and now has hypoxemia fluctuating between 92-94%.   Physical Exam Exam conducted with a chaperone present.  Constitutional:      General: She is not in acute distress.    Appearance: Normal appearance. She is well-developed. She is not ill-appearing, toxic-appearing or diaphoretic.  HENT:     Head: Normocephalic and  atraumatic.     Nose: Nose normal.     Mouth/Throat:     Mouth: Mucous membranes are moist.  Eyes:     Extraocular Movements: Extraocular movements intact.     Pupils: Pupils are equal, round, and reactive to light.  Cardiovascular:     Rate and Rhythm: Normal rate and regular rhythm.     Pulses: Normal pulses.     Heart sounds: Normal heart sounds. No murmur. No friction rub. No gallop.   Pulmonary:     Effort: Pulmonary effort is normal. No respiratory distress.     Breath sounds: No stridor. Examination of the right-lower field reveals decreased breath sounds. Examination of the left-lower field reveals decreased breath sounds. Decreased breath sounds present. No wheezing, rhonchi or rales.  Chest:    Skin:    General: Skin is warm and dry.     Findings: No rash.  Neurological:     General: No focal deficit present.     Mental Status: She is alert and oriented to person, place, and time.  Psychiatric:        Mood and Affect: Mood normal.        Behavior: Behavior normal.        Thought Content: Thought content normal.        Judgment: Judgment  normal.     ED ECG REPORT   Date: 10/25/2019  Rate: 113  Rhythm: sinus tachycardia  QRS Axis: normal  Intervals: normal  ST/T Wave abnormalities: normal  Conduction Disutrbances:none  Narrative Interpretation: Sinus tachycardia at 113bpm, different from ekg in rate but otherwise, ekg's are comparable.   Old EKG Reviewed: changes noted  I have personally reviewed the EKG tracing and agree with the computerized printout as noted.   Assessment and Plan :   1. COVID-19   2. Cough   3. Hypoxemia   4. Fever, unspecified   5. Tachycardia   6. Rash and nonspecific skin eruption     Patient was given high-dose Tylenol and an hour later remains febrile, tachycardic.  Recheck of her pulse oximetry was also declining range between 92 to 94%.  I am concerned that patient is having complications from a [id number] infection, will  redirect her to the emergency room.  Patient is in agreement with treatment plan, our nursing staff will transport her now.   Jaynee Eagles, PA-C 10/25/19 1708

## 2019-10-25 NOTE — Telephone Encounter (Signed)
Patient requested scripts be sent to [facility] on [address].

## 2019-10-25 NOTE — Discharge Instructions (Signed)
I am concerned that you are having complications from your COVID 19 infection. You have a persistent fever despite Tylenol, persistent tachycardia and now have hypoxemia at 93%. Our nursing staff will transport you to the emergency room for further evaluation of complications from [place] 19.

## 2019-10-25 NOTE — ED Triage Notes (Signed)
Tested + Covid last week.  Seen at u/c today for worsening symptoms.  Was sent to ED tachycardia and hy;oxemia at 93%.  NAD at triage, talking in complete sentences.   Pt states the dizziness and lightheadedness and post nasal drip is worse.  During conversation pt states she just wanted u/c to call in cough med.  Per paperwork cough med was sent to [facility] o/p pharmacy.  Pt decided she did not want to be seen today.

## 2019-10-25 NOTE — ED Notes (Addendum)
Pt being sent to the ED via wheelchair x1 RN for Covid + and desating with tachycardia and persistent fever.  Current HR=110, temp=100.9, and SPO2=95.

## 2019-10-25 NOTE — ED Triage Notes (Signed)
Pt states she has a bad cough. Pt states she is positive for Covid .

## 2019-10-26 ENCOUNTER — Other Ambulatory Visit: Payer: Self-pay

## 2019-10-26 ENCOUNTER — Encounter (HOSPITAL_COMMUNITY): Payer: Self-pay

## 2019-10-26 ENCOUNTER — Emergency Department (HOSPITAL_COMMUNITY): Payer: 59

## 2019-10-26 ENCOUNTER — Inpatient Hospital Stay (HOSPITAL_COMMUNITY)
Admission: EM | Admit: 2019-10-26 | Discharge: 2019-10-28 | DRG: 177 | Disposition: A | Discharge status: Home / Self Care | Payer: 59 | Attending: Family Medicine | Admitting: Family Medicine

## 2019-10-26 DIAGNOSIS — U071 COVID-19: Principal | ICD-10-CM | POA: Diagnosis present

## 2019-10-26 DIAGNOSIS — R319 Hematuria, unspecified: Secondary | ICD-10-CM | POA: Diagnosis present

## 2019-10-26 DIAGNOSIS — Z888 Allergy status to other drugs, medicaments and biological substances status: Secondary | ICD-10-CM

## 2019-10-26 DIAGNOSIS — K219 Gastro-esophageal reflux disease without esophagitis: Secondary | ICD-10-CM | POA: Diagnosis present

## 2019-10-26 DIAGNOSIS — E785 Hyperlipidemia, unspecified: Secondary | ICD-10-CM | POA: Diagnosis present

## 2019-10-26 DIAGNOSIS — E559 Vitamin D deficiency, unspecified: Secondary | ICD-10-CM | POA: Diagnosis present

## 2019-10-26 DIAGNOSIS — I10 Essential (primary) hypertension: Secondary | ICD-10-CM | POA: Diagnosis not present

## 2019-10-26 DIAGNOSIS — R21 Rash and other nonspecific skin eruption: Secondary | ICD-10-CM | POA: Diagnosis present

## 2019-10-26 DIAGNOSIS — J1282 Pneumonia due to coronavirus disease 2019: Secondary | ICD-10-CM | POA: Diagnosis present

## 2019-10-26 DIAGNOSIS — R000 Tachycardia, unspecified: Secondary | ICD-10-CM | POA: Diagnosis not present

## 2019-10-26 DIAGNOSIS — J9601 Acute respiratory failure with hypoxia: Secondary | ICD-10-CM | POA: Diagnosis not present

## 2019-10-26 DIAGNOSIS — E782 Mixed hyperlipidemia: Secondary | ICD-10-CM

## 2019-10-26 DIAGNOSIS — Z8249 Family history of ischemic heart disease and other diseases of the circulatory system: Secondary | ICD-10-CM | POA: Diagnosis not present

## 2019-10-26 DIAGNOSIS — R42 Dizziness and giddiness: Secondary | ICD-10-CM | POA: Diagnosis not present

## 2019-10-26 DIAGNOSIS — R918 Other nonspecific abnormal finding of lung field: Secondary | ICD-10-CM | POA: Diagnosis not present

## 2019-10-26 LAB — CBC WITH DIFFERENTIAL/PLATELET
Abs Immature Granulocytes: 0.03 10*3/uL (ref 0.00–0.07)
Basophils Absolute: 0 10*3/uL (ref 0.0–0.1)
Basophils Relative: 0 %
Eosinophils Absolute: 0 10*3/uL (ref 0.0–0.5)
Eosinophils Relative: 0 %
HCT: 37.8 % (ref 36.0–46.0)
Hemoglobin: 12.3 g/dL (ref 12.0–15.0)
Immature Granulocytes: 1 %
Lymphocytes Relative: 21 %
Lymphs Abs: 1.4 10*3/uL (ref 0.7–4.0)
MCH: 29.6 pg (ref 26.0–34.0)
MCHC: 32.5 g/dL (ref 30.0–36.0)
MCV: 90.9 fL (ref 80.0–100.0)
Monocytes Absolute: 0.3 10*3/uL (ref 0.1–1.0)
Monocytes Relative: 5 %
Neutro Abs: 4.9 10*3/uL (ref 1.7–7.7)
Neutrophils Relative %: 73 %
Platelets: 182 10*3/uL (ref 150–400)
RBC: 4.16 MIL/uL (ref 3.87–5.11)
RDW: 13.1 % (ref 11.5–15.5)
WBC: 6.7 10*3/uL (ref 4.0–10.5)
nRBC: 0 % (ref 0.0–0.2)

## 2019-10-26 LAB — D-DIMER, QUANTITATIVE
D-Dimer, Quant: 0.38 ug/mL-FEU (ref 0.00–0.50)
D-Dimer, Quant: 0.39 ug/mL-FEU (ref 0.00–0.50)

## 2019-10-26 LAB — CBC
HCT: 36.5 % (ref 36.0–46.0)
Hemoglobin: 11.6 g/dL — ABNORMAL LOW (ref 12.0–15.0)
MCH: 28.9 pg (ref 26.0–34.0)
MCHC: 31.8 g/dL (ref 30.0–36.0)
MCV: 91 fL (ref 80.0–100.0)
Platelets: 193 10*3/uL (ref 150–400)
RBC: 4.01 MIL/uL (ref 3.87–5.11)
RDW: 13.1 % (ref 11.5–15.5)
WBC: 6.7 10*3/uL (ref 4.0–10.5)
nRBC: 0 % (ref 0.0–0.2)

## 2019-10-26 LAB — FIBRINOGEN: Fibrinogen: 677 mg/dL — ABNORMAL HIGH (ref 210–475)

## 2019-10-26 LAB — CREATININE, SERUM
Creatinine, Ser: 0.93 mg/dL (ref 0.44–1.00)
GFR calc Af Amer: >60 mL/min (ref >60)
GFR calc non Af Amer: >60 mL/min (ref >60)

## 2019-10-26 LAB — PROCALCITONIN
Procalcitonin: <0.1 ng/mL
Procalcitonin: <0.1 ng/mL

## 2019-10-26 LAB — COMPREHENSIVE METABOLIC PANEL
ALT: 14 U/L (ref 0–44)
AST: 21 U/L (ref 15–41)
Albumin: 3.7 g/dL (ref 3.5–5.0)
Alkaline Phosphatase: 43 U/L (ref 38–126)
Anion gap: 10 (ref 5–15)
BUN: 15 mg/dL (ref 6–20)
CO2: 24 mmol/L (ref 22–32)
Calcium: 8.4 mg/dL — ABNORMAL LOW (ref 8.9–10.3)
Chloride: 104 mmol/L (ref 98–111)
Creatinine, Ser: 0.93 mg/dL (ref 0.44–1.00)
GFR calc Af Amer: >60 mL/min (ref >60)
GFR calc non Af Amer: >60 mL/min (ref >60)
Glucose, Bld: 95 mg/dL (ref 70–99)
Potassium: 3.9 mmol/L (ref 3.5–5.1)
Sodium: 138 mmol/L (ref 135–145)
Total Bilirubin: 0.5 mg/dL (ref 0.3–1.2)
Total Protein: 7.1 g/dL (ref 6.5–8.1)

## 2019-10-26 LAB — LACTATE DEHYDROGENASE
LDH: 249 U/L — ABNORMAL HIGH (ref 98–192)
LDH: 281 U/L — ABNORMAL HIGH (ref 98–192)

## 2019-10-26 LAB — C-REACTIVE PROTEIN: CRP: 11.4 mg/dL — ABNORMAL HIGH (ref <1.0)

## 2019-10-26 LAB — FERRITIN
Ferritin: 145 ng/mL (ref 11–307)
Ferritin: 140 ng/mL (ref 11–307)

## 2019-10-26 LAB — PREGNANCY, URINE: Preg Test, Ur: NEGATIVE

## 2019-10-26 LAB — URINALYSIS, ROUTINE W REFLEX MICROSCOPIC
Bacteria, UA: NONE SEEN
Bilirubin Urine: NEGATIVE
Glucose, UA: NEGATIVE mg/dL
Hgb urine dipstick: NEGATIVE
Ketones, ur: 20 mg/dL — AB
Leukocytes,Ua: NEGATIVE
Nitrite: NEGATIVE
Protein, ur: 30 mg/dL — AB
Specific Gravity, Urine: 1.031 — ABNORMAL HIGH (ref 1.005–1.030)
pH: 5 (ref 5.0–8.0)

## 2019-10-26 LAB — LACTIC ACID, PLASMA
Lactic Acid, Venous: 1 mmol/L (ref 0.5–1.9)
Lactic Acid, Venous: 1.4 mmol/L (ref 0.5–1.9)

## 2019-10-26 LAB — POC SARS CORONAVIRUS 2 AG -  ED: SARS Coronavirus 2 Ag: NEGATIVE

## 2019-10-26 LAB — TRIGLYCERIDES: Triglycerides: 138 mg/dL (ref <150)

## 2019-10-26 MED ORDER — BENZONATATE 100 MG PO CAPS
100.0000 mg | ORAL_CAPSULE | Freq: Three times a day (TID) | ORAL | Status: DC | PRN
  Administered 2019-10-27: 200 mg via ORAL
  Filled 2019-10-26: qty 2

## 2019-10-26 MED ORDER — ONDANSETRON HCL 4 MG PO TABS: 4.0000 mg | ORAL_TABLET | Freq: Four times a day (QID) | ORAL | Status: DC | PRN

## 2019-10-26 MED ORDER — SODIUM CHLORIDE 0.9 % IV SOLN
200.0000 mg | Freq: Once | INTRAVENOUS | Status: AC
  Administered 2019-10-26: 200 mg via INTRAVENOUS
  Filled 2019-10-26: qty 200

## 2019-10-26 MED ORDER — DEXAMETHASONE SODIUM PHOSPHATE 10 MG/ML IJ SOLN
6.0000 mg | INTRAMUSCULAR | Status: DC
  Administered 2019-10-26 – 2019-10-27 (×2): 6 mg via INTRAVENOUS
  Filled 2019-10-26 (×2): qty 1

## 2019-10-26 MED ORDER — DOXYCYCLINE HYCLATE 100 MG PO TABS
100.0000 mg | ORAL_TABLET | Freq: Two times a day (BID) | ORAL | Status: DC
  Administered 2019-10-26 – 2019-10-28 (×4): 100 mg via ORAL
  Filled 2019-10-26 (×4): qty 1

## 2019-10-26 MED ORDER — ACETAMINOPHEN 325 MG PO TABS
650.0000 mg | ORAL_TABLET | Freq: Once | ORAL | Status: AC
  Administered 2019-10-26: 650 mg via ORAL
  Filled 2019-10-26: qty 2

## 2019-10-26 MED ORDER — ONDANSETRON HCL 4 MG/2ML IJ SOLN: 4.0000 mg | Freq: Four times a day (QID) | INTRAMUSCULAR | Status: DC | PRN

## 2019-10-26 MED ORDER — SODIUM CHLORIDE 0.9 % IV BOLUS
500.0000 mL | Freq: Once | INTRAVENOUS | Status: AC
  Administered 2019-10-26: 500 mL via INTRAVENOUS

## 2019-10-26 MED ORDER — ENOXAPARIN SODIUM 40 MG/0.4ML ~~LOC~~ SOLN
40.0000 mg | Freq: Every day | SUBCUTANEOUS | Status: DC
  Administered 2019-10-26 – 2019-10-28 (×3): 40 mg via SUBCUTANEOUS
  Filled 2019-10-26 (×3): qty 0.4

## 2019-10-26 MED ORDER — SODIUM CHLORIDE 0.9 % IV BOLUS
1000.0000 mL | Freq: Once | INTRAVENOUS | Status: AC
  Administered 2019-10-26: 1000 mL via INTRAVENOUS

## 2019-10-26 MED ORDER — SODIUM CHLORIDE 0.9 % IV SOLN
1.0000 g | Freq: Every day | INTRAVENOUS | Status: DC
  Administered 2019-10-26 – 2019-10-28 (×3): 1 g via INTRAVENOUS
  Filled 2019-10-26 (×2): qty 1
  Filled 2019-10-26: qty 10

## 2019-10-26 MED ORDER — ALBUTEROL SULFATE HFA 108 (90 BASE) MCG/ACT IN AERS
2.0000 | INHALATION_SPRAY | Freq: Four times a day (QID) | RESPIRATORY_TRACT | Status: DC
  Administered 2019-10-26 – 2019-10-28 (×9): 2 via RESPIRATORY_TRACT
  Filled 2019-10-26 (×2): qty 6.7

## 2019-10-26 MED ORDER — ACETAMINOPHEN 325 MG PO TABS: 650.0000 mg | ORAL_TABLET | Freq: Four times a day (QID) | ORAL | Status: DC | PRN

## 2019-10-26 MED ORDER — SODIUM CHLORIDE 0.9 % IV SOLN
100.0000 mg | Freq: Every day | INTRAVENOUS | Status: DC
  Administered 2019-10-27 – 2019-10-28 (×2): 100 mg via INTRAVENOUS
  Filled 2019-10-26 (×2): qty 100

## 2019-10-26 NOTE — ED Notes (Signed)
PATIENT AMBULATED DOWN THE HALL O2 STATS WERE 89 % HEART RATE REMAINED IN 120'S  ONCE BACK IN ROOM AND PATIENT WAS AT REST O2 STATS WERE IN THE LOW 90'S

## 2019-10-26 NOTE — Progress Notes (Signed)
Pt arrived to unit from ER via stretcher. NAD.

## 2019-10-26 NOTE — ED Provider Notes (Signed)
Early DEPT Provider Note   CSN: [id number] Arrival date & time: 10/26/19  [id number]     History Chief Complaint  Patient presents with  . COVID+  . Hematuria  . Cough    Rose Bell is a 39 y.o. female.  HPI  39 year old female presents with hematuria.  She was diagnosed with Covid about 8 days ago.  She has been feeling lightheaded, had cough and fever.  The lightheadedness seems to be worse over the last day or so.  Went to urgent care yesterday.  She states that the hematuria started last night and urinating twice she has noticed hematuria which she describes as some blood in the toilet as well as some with wiping.  She does not feel like it is vaginal bleeding or rectal bleeding.  Some dysuria at the end of urination.  A little bit of low back pain but no abdominal pain, vomiting.  No flank pain.  She does feel short of breath with exertion though not at rest.  Past Medical History:  Diagnosis Date  . Hyperlipemia   . Hypertension     Patient Active Problem List   Diagnosis Date Noted  . Fracture of finger, middle phalanx, left, closed 09/26/2019  . Orgasmic headache 01/08/2016  . Obesity (BMI 30.0-34.9) 01/02/2015  . Hyperlipidemia 08/10/2014  . GERD (gastroesophageal reflux disease) 03/04/2012  . General medical examination 06/26/2011  . Screening for malignant neoplasm of the cervix 06/26/2011  . Vitamin D deficiency 05/30/2009  . Anxiety state 05/30/2009  . B12 deficiency 05/23/2009    History reviewed. No pertinent surgical history.   OB History   No obstetric history on file.     Family History  Problem Relation Age of Onset  . Diabetes Mother   . Hypertension Mother   . Hypothyroidism Mother   . Fibroids Mother   . Diabetes Other        father side of family    Social History   Tobacco Use  . Smoking status: Never Smoker  . Smokeless tobacco: Never Used  Substance Use Topics  . Alcohol use: Yes    Comment:  socially  . Drug use: No    Home Medications Prior to Admission medications   Medication Sig Start Date End Date Taking? Authorizing Provider  benzonatate (TESSALON) 100 MG capsule Take 1-2 capsules (100-200 mg total) by mouth 3 (three) times daily as needed. 10/25/19  Yes Jaynee Eagles, PA-C  promethazine-dextromethorphan (PROMETHAZINE-DM) 6.25-15 MG/5ML syrup Take 5 mLs by mouth at bedtime as needed for cough. 10/25/19  Yes Jaynee Eagles, PA-C    Allergies    Phentermine and Bupropion  Review of Systems   Review of Systems  Constitutional: Positive for fever.  Respiratory: Positive for cough and shortness of breath.   Gastrointestinal: Negative for abdominal pain, blood in stool and nausea.  Genitourinary: Positive for dysuria and hematuria. Negative for flank pain.  Musculoskeletal: Positive for back pain.  Neurological: Positive for light-headedness.  All other systems reviewed and are negative.   Physical Exam Updated Vital Signs BP (!) 113/59   Pulse (!) 110   Temp (!) 103.3 F (39.6 C) (Oral)   Resp (!) 36   LMP 10/14/2019   SpO2 91%   Physical Exam Vitals and nursing note reviewed.  Constitutional:      General: She is not in acute distress.    Appearance: She is well-developed. She is not diaphoretic.  HENT:     Head: Normocephalic and  atraumatic.     Right Ear: External ear normal.     Left Ear: External ear normal.     Nose: Nose normal.  Eyes:     General:        Right eye: No discharge.        Left eye: No discharge.  Cardiovascular:     Rate and Rhythm: Regular rhythm. Tachycardia present.     Heart sounds: Normal heart sounds.  Pulmonary:     Effort: Pulmonary effort is normal. Tachypnea present. No accessory muscle usage or respiratory distress.     Breath sounds: Normal breath sounds. No wheezing or rales.  Abdominal:     General: There is no distension.     Palpations: Abdomen is soft.     Tenderness: There is no abdominal tenderness. There is no  right CVA tenderness or left CVA tenderness.  Musculoskeletal:     Thoracic back: No tenderness.     Lumbar back: No tenderness.  Skin:    General: Skin is warm and dry.  Neurological:     Mental Status: She is alert.  Psychiatric:        Mood and Affect: Mood is not anxious.     ED Results / Procedures / Treatments   Labs (all labs ordered are listed, but only abnormal results are displayed) Labs Reviewed  URINALYSIS, ROUTINE W REFLEX MICROSCOPIC - Abnormal; Notable for the following components:      Result Value   Specific Gravity, Urine 1.031 (*)    Ketones, ur 20 (*)    Protein, ur 30 (*)    All other components within normal limits  COMPREHENSIVE METABOLIC PANEL - Abnormal; Notable for the following components:   Calcium 8.4 (*)    All other components within normal limits  URINE CULTURE  CULTURE, BLOOD (ROUTINE X 2)  CULTURE, BLOOD (ROUTINE X 2)  PREGNANCY, URINE  CBC WITH DIFFERENTIAL/PLATELET  LACTIC ACID, PLASMA  LACTIC ACID, PLASMA  D-DIMER, QUANTITATIVE (NOT AT [facility])  PROCALCITONIN  LACTATE DEHYDROGENASE  FERRITIN  TRIGLYCERIDES  FIBRINOGEN  C-REACTIVE PROTEIN  POC SARS CORONAVIRUS 2 AG -  ED    EKG EKG Interpretation  Date/Time:  Thursday October 26 2019 10:11:03 EST Ventricular Rate:  121 PR Interval:    QRS Duration: 82 QT Interval:  302 QTC Calculation: 429 R Axis:   8 Text Interpretation: Sinus tachycardia Ventricular premature complex besides PVC, no significant change since yesterday Confirmed by Sherwood Gambler [phone]) on 10/26/2019 10:19:18 AM   Radiology DG Chest Portable 1 View  Result Date: 10/26/2019 CLINICAL DATA:  COVID, dyspnea EXAM: PORTABLE CHEST 1 VIEW COMPARISON:  None. FINDINGS: The heart size and mediastinal contours are within normal limits. Multifocal bilateral heterogeneous airspace opacity, most conspicuous in the left midlung. The visualized skeletal structures are unremarkable. IMPRESSION: Multifocal bilateral  heterogeneous airspace opacity, most conspicuous in the left midlung, in keeping with COVID-19. Electronically Signed   By: Eddie Candle M.D.   On: 10/26/2019 11:52    Procedures Procedures (including critical care time)  Medications Ordered in ED Medications  acetaminophen (TYLENOL) tablet 650 mg (650 mg Oral Given 10/26/19 1204)  sodium chloride 0.9 % bolus 1,000 mL (0 mLs Intravenous Stopped 10/26/19 1220)    ED Course  I have reviewed the triage vital signs and the nursing notes.  Pertinent labs & imaging results that were available during my care of the patient were reviewed by me and considered in my medical decision making (see chart for details).  MDM Rules/Calculators/A&P                      Patient has remained tachycardic, likely partially due to fever and partially from dehydration.  Labs are overall reassuring and there is no hematuria seen though she does have a little dysuria.  Overall, my biggest concern is for her increased work of breathing and borderline low sats of 90% on room air.  When she gets up to walk her shortness of breath worsens and her sats go to 88%.  I think she would benefit from treatment such as steroids and will admit her to the hospitalist service.  Viky Corpe was evaluated in Emergency Department on 10/26/2019 for the symptoms described in the history of present illness. She was evaluated in the context of the global COVID-19 pandemic, which necessitated consideration that the patient might be at risk for infection with the SARS-CoV-2 virus that causes COVID-19. Institutional protocols and algorithms that pertain to the evaluation of patients at risk for COVID-19 are in a state of rapid change based on information released by regulatory bodies including the CDC and federal and state organizations. These policies and algorithms were followed during the patient's care in the ED.  Final Clinical Impression(s) / ED Diagnoses Final diagnoses:  COVID-19  virus infection    Rx / DC Orders ED Discharge Orders    None       Sherwood Gambler, MD 10/26/19 1324

## 2019-10-26 NOTE — ED Notes (Signed)
Pt Spo2 reading at 90% placed pt on 2 L nasal cannula. Spo2 now 95%

## 2019-10-26 NOTE — H&P (Addendum)
History and Physical    DOA: 10/26/2019  PCP: Midge Minium, MD  Patient coming from: Home  Chief Complaint: Cough, hematuria  HPI: Rose Bell is a 39 y.o. female with history h/o hypertension, hyperlipidemia who was diagnosed with COVID-19 on December 31 and managed conservatively as outpatient presents with complaints of feeling lightheaded, weak associated with respiratory symptoms of productive cough, mild exertional dyspnea and fevers.  She also reported hematuria on presentation along with some dysuria but no flank/abdominal pain or vomiting. She reports noting pink urine in the toilet bowl after she voided yesterday evening and this morning.  ED course: Febrile with T-max of 103.3, pulse 108, respiratory rate 25, blood pressure 97/60.  Work-up in the ED revealed sodium 138, potassium 3.9 chloride 104, CO2 24, glucose 95, BUN 15, creatinine 0.93, calcium 8.4, anion gap 10, lactate 1.0.  WBC 6.7, hemoglobin 12.3, hematocrit 37.8, platelets 182.  UA with mild ketonuria and proteinuria, elevated specific gravity but hemoglobin negative, RBC 0-5, WBC 6-10, no bacteria seen.  COVID-19 POC done today in the ED resulted negative but confirmatory PCR pending. Chest x-ray shows multifocal bibasilar infiltrates worse along left midlung consistent with COVID-19 pneumonia.  Patient noted to be hypoxic on ambulation in the ED (O2 sats dropped to 89% and heart rate was up in 120s upon ambulation, saturation picked up to low 90s at rest). Patient requested to be admitted to [facility] service.   Review of Systems: As per HPI otherwise 10 point review of systems negative.    Past Medical History:  Diagnosis Date  . Hyperlipemia   . Hypertension     History reviewed. No pertinent surgical history.  Social history:  reports that she has never smoked. She has never used smokeless tobacco. She reports current alcohol use. She reports that she does not use drugs.   Allergies  Allergen Reactions  .  Phentermine Swelling    After 3 days pt says tongue swells and was having trouble with speech  . Bupropion     Family History  Problem Relation Age of Onset  . Diabetes Mother   . Hypertension Mother   . Hypothyroidism Mother   . Fibroids Mother   . Diabetes Other        father side of family      Prior to Admission medications   Medication Sig Start Date End Date Taking? Authorizing Provider  benzonatate (TESSALON) 100 MG capsule Take 1-2 capsules (100-200 mg total) by mouth 3 (three) times daily as needed. 10/25/19  Yes Jaynee Eagles, PA-C  promethazine-dextromethorphan (PROMETHAZINE-DM) 6.25-15 MG/5ML syrup Take 5 mLs by mouth at bedtime as needed for cough. 10/25/19  Yes Jaynee Eagles, PA-C    Physical Exam: Vitals:   10/26/19 1430 10/26/19 1500 10/26/19 1530 10/26/19 1538  BP: 105/65 (!) 94/58 109/63   Pulse: 98 97 (!) 104   Resp: 13 (!) 27 (!) 30   Temp:    99.5 F (37.5 C)  TempSrc:    Oral  SpO2: 93% 92% 93%     Constitutional: NAD, calm, comfortable Eyes: PERRL, lids and conjunctivae normal ENMT: Mucous membranes are moist. Posterior pharynx clear of any exudate or lesions.Normal dentition.  Neck: normal, supple, no masses, no thyromegaly Respiratory: clear to auscultation bilaterally, no wheezing, no crackles. Normal respiratory effort. No accessory muscle use.  Cardiovascular: Regular rate and rhythm, no murmurs / rubs / gallops. No extremity edema. 2+ pedal pulses. No carotid bruits.  Abdomen: no tenderness, no masses palpated. No  hepatosplenomegaly. Bowel sounds positive.  Musculoskeletal: no clubbing / cyanosis. No joint deformity upper and lower extremities. Good ROM, no contractures. Normal muscle tone.  Neurologic: CN 2-12 grossly intact. Sensation intact, DTR normal. Strength 5/5 in all 4.  Psychiatric: Normal judgment and insight. Alert and oriented x 3. Normal mood.  SKIN/catheters: no rashes, lesions, ulcers. No induration  Labs on Admission: I have  personally reviewed following labs and imaging studies  CBC: Recent Labs  Lab 10/26/19 1123  WBC 6.7  NEUTROABS 4.9  HGB 12.3  HCT 37.8  MCV 90.9  PLT [id number]   Basic Metabolic Panel: Recent Labs  Lab 10/26/19 1123  NA 138  K 3.9  CL 104  CO2 24  GLUCOSE 95  BUN 15  CREATININE 0.93  CALCIUM 8.4*   GFR: Estimated Creatinine Clearance: 83.7 mL/min (by C-G formula based on SCr of 0.93 mg/dL). Liver Function Tests: Recent Labs  Lab 10/26/19 1123  AST 21  ALT 14  ALKPHOS 43  BILITOT 0.5  PROT 7.1  ALBUMIN 3.7   No results for input(s): LIPASE, AMYLASE in the last 168 hours. No results for input(s): AMMONIA in the last 168 hours. Coagulation Profile: No results for input(s): INR, PROTIME in the last 168 hours. Cardiac Enzymes: No results for input(s): CKTOTAL, CKMB, CKMBINDEX, TROPONINI in the last 168 hours. BNP (last 3 results) No results for input(s): PROBNP in the last 8760 hours. HbA1C: No results for input(s): HGBA1C in the last 72 hours. CBG: No results for input(s): GLUCAP in the last 168 hours. Lipid Profile: Recent Labs    10/26/19 1123  TRIG 138   Thyroid Function Tests: No results for input(s): TSH, T4TOTAL, FREET4, T3FREE, THYROIDAB in the last 72 hours. Anemia Panel: Recent Labs    10/26/19 1356  FERRITIN 145   Urine analysis:    Component Value Date/Time   COLORURINE YELLOW 10/26/2019 1008   APPEARANCEUR CLEAR 10/26/2019 1008   LABSPEC 1.031 (H) 10/26/2019 1008   PHURINE 5.0 10/26/2019 1008   GLUCOSEU NEGATIVE 10/26/2019 1008   HGBUR NEGATIVE 10/26/2019 1008   BILIRUBINUR NEGATIVE 10/26/2019 1008   BILIRUBINUR negative 11/30/2016 1132   KETONESUR 20 (A) 10/26/2019 1008   PROTEINUR 30 (A) 10/26/2019 1008   UROBILINOGEN 0.2 11/30/2016 1132   UROBILINOGEN 0.2 02/13/2015 1902   NITRITE NEGATIVE 10/26/2019 1008   LEUKOCYTESUR NEGATIVE 10/26/2019 1008    Radiological Exams on Admission: Personally reviewed  DG Chest Portable 1  View  Result Date: 10/26/2019 CLINICAL DATA:  COVID, dyspnea EXAM: PORTABLE CHEST 1 VIEW COMPARISON:  None. FINDINGS: The heart size and mediastinal contours are within normal limits. Multifocal bilateral heterogeneous airspace opacity, most conspicuous in the left midlung. The visualized skeletal structures are unremarkable. IMPRESSION: Multifocal bilateral heterogeneous airspace opacity, most conspicuous in the left midlung, in keeping with COVID-19. Electronically Signed   By: Eddie Candle M.D.   On: 10/26/2019 11:52    EKG: Independently reviewed. Sinus tachycardia, PVC, qtc 422 ms     Assessment and Plan:   Principal Problem:   Pneumonia due to COVID-19 virus Active Problems:   Vitamin D deficiency   Hyperlipidemia    1.  COVID-19 pneumonia with mild acute hypoxic respiratory failure: Present on admission.  Given recent positive Covid testing and continued fevers/typical symptoms and chest x-ray abnormality, will treat as Covid pneumonia although POC Covid testing negative today.  Follow-up PCR results.  If negative, may need to consider post Covid syndrome.  Will start on IV steroids, IV remdesivir  and add empiric antibiotics as well.  Follow-up CRP, procalcitonin levels and titrate antibiotics as needed.  Taper O2 as tolerated.  2.  Hypertension/hyperlipidemia: No home meds listed.  Her blood pressure appears to be soft and on the low side.  Received IV fluid bolus in the ED.  Will repeat if systolic blood pressure less than 100.  3.?  Hematuria: Unclear if patient could have had rectal bleeding as no evidence of microscopic hematuria on UA.  She does have some pyuria but no evidence of bacteriuria.  Denies hematochezia or melena. monitor for now.    DVT prophylaxis: Lovenox  COVID screen: Point of contact screen negative, PCR pending  Code Status: Full code   .Health care proxy would be her spouse [place]  Patient/Family Communication: Discussed with patient and all questions  answered to satisfaction.  Consults called: None Admission status :I certify that at the point of admission it is my clinical judgment that the patient will require inpatient hospital care spanning beyond 2 midnights from the point of admission due to high intensity of service and high frequency of surveillance required.Inpatient status is judged to be reasonable and necessary in order to provide the required intensity of service to ensure the patient's safety. The patient's presenting symptoms, physical exam findings, and initial radiographic and laboratory data in the context of their chronic comorbidities is felt to place them at high risk for further clinical deterioration. The following factors support the patient status of inpatient : Presumed COVID-19 pneumonia with acute hypoxic respiratory failure Expected LOS: 2-3 days    Guilford Shi MD [facility] Pager [phone]  If 7PM-7AM, please contact night-coverage [email or web address] Password [email or web address]  10/26/2019, 4:10 PM

## 2019-10-26 NOTE — ED Notes (Signed)
Pts blood pressure running soft MD notified for fluid bolus

## 2019-10-26 NOTE — ED Notes (Signed)
Pt provided food tray

## 2019-10-26 NOTE — ED Triage Notes (Signed)
Pt was seen yesterday at [facility]. Pt states that she was discharged. After coming home, she started having blood in her urine. Pt states she has also had a persistent cough.   Pt COVID + 10/19/19

## 2019-10-26 NOTE — ED Notes (Signed)
Pt complaining of pain at IV site. Assessment revealed IV had infiltrated. Iv removed

## 2019-10-27 LAB — ABO/RH: ABO/RH(D): A POS

## 2019-10-27 LAB — CBC WITH DIFFERENTIAL/PLATELET
Abs Immature Granulocytes: 0.02 10*3/uL (ref 0.00–0.07)
Basophils Absolute: 0 10*3/uL (ref 0.0–0.1)
Basophils Relative: 0 %
Eosinophils Absolute: 0 10*3/uL (ref 0.0–0.5)
Eosinophils Relative: 0 %
HCT: 38.4 % (ref 36.0–46.0)
Hemoglobin: 11.9 g/dL — ABNORMAL LOW (ref 12.0–15.0)
Immature Granulocytes: 1 %
Lymphocytes Relative: 34 %
Lymphs Abs: 1.4 10*3/uL (ref 0.7–4.0)
MCH: 28.7 pg (ref 26.0–34.0)
MCHC: 31 g/dL (ref 30.0–36.0)
MCV: 92.5 fL (ref 80.0–100.0)
Monocytes Absolute: 0.1 10*3/uL (ref 0.1–1.0)
Monocytes Relative: 3 %
Neutro Abs: 2.6 10*3/uL (ref 1.7–7.7)
Neutrophils Relative %: 62 %
Platelets: 190 10*3/uL (ref 150–400)
RBC: 4.15 MIL/uL (ref 3.87–5.11)
RDW: 12.9 % (ref 11.5–15.5)
WBC: 4.2 10*3/uL (ref 4.0–10.5)
nRBC: 0 % (ref 0.0–0.2)

## 2019-10-27 LAB — URINE CULTURE

## 2019-10-27 LAB — HIV ANTIBODY (ROUTINE TESTING W REFLEX): HIV Screen 4th Generation wRfx: NONREACTIVE

## 2019-10-27 LAB — D-DIMER, QUANTITATIVE: D-Dimer, Quant: 0.33 ug/mL-FEU (ref 0.00–0.50)

## 2019-10-27 LAB — FERRITIN: Ferritin: 175 ng/mL (ref 11–307)

## 2019-10-27 LAB — C-REACTIVE PROTEIN: CRP: 11.8 mg/dL — ABNORMAL HIGH (ref <1.0)

## 2019-10-27 MED ORDER — ZINC SULFATE 220 (50 ZN) MG PO CAPS
220.0000 mg | ORAL_CAPSULE | Freq: Every day | ORAL | Status: DC
  Administered 2019-10-27 – 2019-10-28 (×2): 220 mg via ORAL
  Filled 2019-10-27 (×2): qty 1

## 2019-10-27 MED ORDER — IPRATROPIUM BROMIDE HFA 17 MCG/ACT IN AERS
2.0000 | INHALATION_SPRAY | Freq: Four times a day (QID) | RESPIRATORY_TRACT | Status: DC
  Administered 2019-10-27 – 2019-10-28 (×6): 2 via RESPIRATORY_TRACT
  Filled 2019-10-27: qty 12.9

## 2019-10-27 MED ORDER — ASCORBIC ACID 500 MG PO TABS
500.0000 mg | ORAL_TABLET | Freq: Every day | ORAL | Status: DC
  Administered 2019-10-27 – 2019-10-28 (×2): 500 mg via ORAL
  Filled 2019-10-27 (×2): qty 1

## 2019-10-27 MED ORDER — SODIUM CHLORIDE 0.9 % IV SOLN
INTRAVENOUS | Status: DC | PRN
  Administered 2019-10-27: 250 mL via INTRAVENOUS

## 2019-10-27 MED ORDER — GUAIFENESIN-DM 100-10 MG/5ML PO SYRP
10.0000 mL | ORAL_SOLUTION | ORAL | Status: DC | PRN
  Administered 2019-10-27 (×2): 10 mL via ORAL
  Filled 2019-10-27 (×2): qty 10

## 2019-10-27 MED ORDER — HYDROCOD POLST-CPM POLST ER 10-8 MG/5ML PO SUER: 5.0000 mL | Freq: Two times a day (BID) | ORAL | Status: DC | PRN

## 2019-10-27 NOTE — Progress Notes (Signed)
PROGRESS NOTE  Rose Bell  DOB: 04/19/1981  PCP: Midge Minium, MD AV:[id number]  DOA: 10/26/2019  Admitted From: Home  LOS: 1 day   Chief Complaint  Patient presents with  . COVID positive  . Hematuria  . Cough   Brief narrative: Rose Bell is a 39 y.o. female with PMH of HTN, HLD who was diagnosed with COVID-19 on December 31 and managed conservatively as outpatient. Patient presented to the ED on 11/7 with complaint of lightheadedness, weakness, productive cough, fever. Patient states she might have gotten it from her 31 year old son who was sick but denies symptoms. Over the last 2 weeks, patient symptoms progressed. She was diagnosed with Covid as an outpatient a week prior and was recuperating at home but symptoms got worse.  In the ED, patient was febrile with temperature of 103.3. Work-up showed WBC 6.7.  Procalcitonin level less than 0.1. COVID-19 positive as an outpatient.  Chest x-ray shows multifocal bibasilar infiltrates worse along left midlung consistent with COVID-19 pneumonia.   Patient noted to be hypoxic on ambulation in the ED (O2 sats dropped to 89% and heart rate was up in 120s upon ambulation, saturation picked up to low 90s at rest).  Patient was admitted to hospitalist medicine service for Covid pneumonia.  Subjective: Patient was seen and examined this morning.  Pleasant young female.  Feels much better than presentation. On 2 L oxygen via nasal cannula.  Assessment/Plan: COVID pneumonia Acute respiratory failure with hypoxia  -Presented with weakness, productive cough, fever -Chest imaging -multifocal bibasilar infiltrates -Treatment: Decadron 6 mg daily for 10 days, IV Remdesivir for 5 days to complete on 1/11 -Supportive care: Vitamin C, Zinc, inhalers, Tylenol, Antitussives - benzonatate, Mucinex -Oxygen - SpO2: 93 % O2 Flow Rate (L/min): 2 L/min -Continue airborne/contact isolation precautions. -WBC and inflammatory markers trend as  below.  D-dimer and ferritin level are not significantly elevated but CRP still is high.  Patient clinically is improving.  I do not think she needs from Actemra at this time.   Recent Labs  Lab 10/26/19 1123 10/26/19 1444 10/27/19 0201  WBC 6.7 6.7 4.2   Recent Labs    10/26/19 1356 10/26/19 1444 10/26/19 1445 10/27/19 0909  DDIMER 0.38 0.39  --  0.33  FERRITIN 145  --  140 175  LDH 249* 281*  --   --   CRP 11.4*  --   --  11.8*    History of hypertension -Blood pressure seems to be in the low side of normal.  Not on home meds.  Continue to monitor blood pressure.  Hematuria: -Patient recently complained of hematuria on arrival.  UA normal.   -Patient stated to me this morning that she seems to be having vaginal bleeding, LMP 15 days ago.  This is unusual for her.  Patient was wondering if this is related to Covid.  I do not think so.  Continue to monitor.  Consider stopping Lovenox if bleeding gets worse.  Mobility: Encourage ambulation Diet: Cardiac diet Fluid: Encourage oral hydration, DVT prophylaxis:  Lovenox Code Status:  Full code Family Communication:  Expected Discharge:  If continues to improve, may be able to go home in next 1 to 2 days to complete remdesivir as an outpatient.  Consultants:    Procedures:    Antimicrobials: Anti-infectives (From admission, onward)   Start     Dose/Rate Route Frequency Ordered Stop   10/27/19 1000  remdesivir 100 mg in sodium chloride 0.9 % 100 mL IVPB  100 mg 200 mL/hr over 30 Minutes Intravenous Daily 10/26/19 1446 10/31/19 0959   10/26/19 1600  remdesivir 200 mg in sodium chloride 0.9% 250 mL IVPB     200 mg 580 mL/hr over 30 Minutes Intravenous Once 10/26/19 1446 10/26/19 1723   10/26/19 1600  cefTRIAXone (ROCEPHIN) 1 g in sodium chloride 0.9 % 100 mL IVPB     1 g 200 mL/hr over 30 Minutes Intravenous Daily 10/26/19 1449     10/26/19 1600  doxycycline (VIBRA-TABS) tablet 100 mg     100 mg Oral Every 12  hours 10/26/19 1450          Code Status: Full Code   Diet Order            Diet Heart Room service appropriate? Yes; Fluid consistency: Thin  Diet effective now              Infusions:  . sodium chloride 250 mL (10/27/19 0913)  . cefTRIAXone (ROCEPHIN)  IV 1 g (10/27/19 0916)  . remdesivir 100 mg in NS 100 mL 100 mg (10/27/19 1133)    Scheduled Meds: . albuterol  2 puff Inhalation Q6H  . vitamin C  500 mg Oral Daily  . dexamethasone (DECADRON) injection  6 mg Intravenous Q24H  . doxycycline  100 mg Oral Q12H  . enoxaparin (LOVENOX) injection  40 mg Subcutaneous Daily  . ipratropium  2 puff Inhalation Q6H  . zinc sulfate  220 mg Oral Daily    PRN meds: sodium chloride, acetaminophen, benzonatate, chlorpheniramine-HYDROcodone, guaiFENesin-dextromethorphan, ondansetron **OR** ondansetron (ZOFRAN) IV   Objective: Vitals:   10/27/19 0558 10/27/19 1315  BP: (!) 102/55 107/78  Pulse: 70 86  Resp: 18 18  Temp: 97.7 F (36.5 C) 98.1 F (36.7 C)  SpO2: 97% 93%    Intake/Output Summary (Last 24 hours) at 10/27/2019 1414 Last data filed at 10/27/2019 1200 Gross per 24 hour  Intake 1894.22 ml  Output 0 ml  Net 1894.22 ml   Filed Weights   10/26/19 1912  Weight: 88.5 kg   Weight change:  Body mass index is 35.67 kg/m.   Physical Exam: General exam: Appears calm and comfortable.  Skin: No rashes, lesions or ulcers. HEENT: Atraumatic, normocephalic, supple neck, no obvious bleeding Lungs: Clear to auscultation bilaterally CVS: Regular rate and rhythm, no murmur GI/Abd soft, nontender, nondistended, bowel sound present CNS: Alert, awake, oriented x3 Psychiatry: Mood appropriate Extremities: No pedal edema, no calf tenderness  Data Review: I have personally reviewed the laboratory data and studies available.  Recent Labs  Lab 10/26/19 1123 10/26/19 1444 10/27/19 0201  WBC 6.7 6.7 4.2  NEUTROABS 4.9  --  2.6  HGB 12.3 11.6* 11.9*  HCT 37.8 36.5 38.4  MCV  90.9 91.0 92.5  PLT 182 193 190   Recent Labs  Lab 10/26/19 1123 10/26/19 1444  NA 138  --   K 3.9  --   CL 104  --   CO2 24  --   GLUCOSE 95  --   BUN 15  --   CREATININE 0.93 0.93  CALCIUM 8.4*  --     Terrilee Croak, MD  [facility] 10/27/2019

## 2019-10-28 ENCOUNTER — Other Ambulatory Visit (HOSPITAL_COMMUNITY): Payer: Self-pay

## 2019-10-28 DIAGNOSIS — U071 COVID-19: Secondary | ICD-10-CM

## 2019-10-28 DIAGNOSIS — J1282 Pneumonia due to coronavirus disease 2019: Secondary | ICD-10-CM

## 2019-10-28 LAB — CBC WITH DIFFERENTIAL/PLATELET
Abs Immature Granulocytes: 0.02 10*3/uL (ref 0.00–0.07)
Basophils Absolute: 0 10*3/uL (ref 0.0–0.1)
Basophils Relative: 0 %
Eosinophils Absolute: 0 10*3/uL (ref 0.0–0.5)
Eosinophils Relative: 0 %
HCT: 34.7 % — ABNORMAL LOW (ref 36.0–46.0)
Hemoglobin: 10.9 g/dL — ABNORMAL LOW (ref 12.0–15.0)
Immature Granulocytes: 0 %
Lymphocytes Relative: 21 %
Lymphs Abs: 1.1 10*3/uL (ref 0.7–4.0)
MCH: 28.8 pg (ref 26.0–34.0)
MCHC: 31.4 g/dL (ref 30.0–36.0)
MCV: 91.6 fL (ref 80.0–100.0)
Monocytes Absolute: 0.2 10*3/uL (ref 0.1–1.0)
Monocytes Relative: 4 %
Neutro Abs: 3.8 10*3/uL (ref 1.7–7.7)
Neutrophils Relative %: 75 %
Platelets: 231 10*3/uL (ref 150–400)
RBC: 3.79 MIL/uL — ABNORMAL LOW (ref 3.87–5.11)
RDW: 13 % (ref 11.5–15.5)
WBC: 5.1 10*3/uL (ref 4.0–10.5)
nRBC: 0 % (ref 0.0–0.2)

## 2019-10-28 LAB — FERRITIN: Ferritin: 179 ng/mL (ref 11–307)

## 2019-10-28 LAB — GLUCOSE, CAPILLARY
Glucose-Capillary: 139 mg/dL — ABNORMAL HIGH (ref 70–99)
Glucose-Capillary: 179 mg/dL — ABNORMAL HIGH (ref 70–99)

## 2019-10-28 LAB — D-DIMER, QUANTITATIVE: D-Dimer, Quant: 0.32 ug/mL-FEU (ref 0.00–0.50)

## 2019-10-28 LAB — C-REACTIVE PROTEIN: CRP: 6.8 mg/dL — ABNORMAL HIGH (ref <1.0)

## 2019-10-28 MED ORDER — ASCORBIC ACID 500 MG PO TABS: 500.0000 mg | ORAL_TABLET | Freq: Every day | ORAL | 0 refills | Status: DC

## 2019-10-28 MED ORDER — ALBUTEROL SULFATE HFA 108 (90 BASE) MCG/ACT IN AERS: 2.0000 | INHALATION_SPRAY | Freq: Four times a day (QID) | RESPIRATORY_TRACT | 0 refills | Status: DC

## 2019-10-28 MED ORDER — ZINC SULFATE 220 (50 ZN) MG PO CAPS: 220.0000 mg | ORAL_CAPSULE | Freq: Every day | ORAL | 0 refills | Status: DC

## 2019-10-28 MED ORDER — CEFDINIR 300 MG PO CAPS: 300.0000 mg | ORAL_CAPSULE | Freq: Two times a day (BID) | ORAL | 0 refills | Status: DC

## 2019-10-28 MED ORDER — DOXYCYCLINE HYCLATE 100 MG PO TABS: 100.0000 mg | ORAL_TABLET | Freq: Two times a day (BID) | ORAL | 0 refills | Status: DC

## 2019-10-28 MED ORDER — ASPIRIN EC 325 MG PO TBEC: 325.0000 mg | DELAYED_RELEASE_TABLET | Freq: Every day | ORAL | 3 refills | Status: DC

## 2019-10-28 MED ORDER — IPRATROPIUM BROMIDE HFA 17 MCG/ACT IN AERS: 2.0000 | INHALATION_SPRAY | Freq: Four times a day (QID) | RESPIRATORY_TRACT | 12 refills | Status: DC

## 2019-10-28 MED ORDER — DEXAMETHASONE 6 MG PO TABS: 6.0000 mg | ORAL_TABLET | Freq: Two times a day (BID) | ORAL | 0 refills | Status: DC

## 2019-10-28 NOTE — Discharge Summary (Addendum)
Discharge Summary  Rose Bell [id number] DOB: 12/30/1980  PCP: Midge Minium, MD  Admit date: 10/26/2019 Discharge date: 10/28/2019   Time spent: Greater than 30 minutes  Recommendations for Outpatient Follow-up:  1. Outpatient remdesivir clinic, to complete remdesivir on October 31, 2019 2. Primary care provider 1 to 2 weeks  Discharge Diagnoses:  Active Hospital Problems   Diagnosis Date Noted  . Pneumonia due to COVID-19 virus 10/26/2019  . Hyperlipidemia 08/10/2014  . Vitamin D deficiency 05/30/2009    Resolved Hospital Problems  No resolved problems to display.    Discharge Condition: Improved  Diet recommendation: Regular  Vitals:   10/28/19 0601 10/28/19 1405  BP: 114/66 109/64  Pulse: 95 85  Resp: 18 20  Temp: 98 F (36.7 C) 98 F (36.7 C)  SpO2: 94% 97%    History of present illness:   Rose Corbettis a 39 y.o.femalewith PMH of HTN, HLD who was diagnosed with COVID-19 on December 31 and managed conservatively as outpatient. Patient presented to the ED on 11/7 with complaint of lightheadedness, weakness, productive cough, fever. Patient states she might have gotten it from her 12 year old son who was sick but denies symptoms. Over the last 2 weeks, patient symptoms progressed. She was diagnosed with Covid as an outpatient a week prior and was recuperating at home but symptoms got worse.  In the ED, patient was febrile with temperature of 103.3. Work-up showed WBC 6.7.  Procalcitonin level less than 0.1. COVID-19 positive as an outpatient.  Chest x-ray shows multifocal bibasilar infiltrates worse along left midlung consistent with COVID-19 pneumonia.  Patient noted to be hypoxic on ambulation in the ED (O2 sats dropped to 89% and heart rate was upin 120s upon ambulation, saturation picked up to low 90s at rest).  Patient was admitted to hospitalist medicine service for Covid pneumonia.  Hospital Course:  Principal Problem:   Pneumonia due  to COVID-19 virus Active Problems:   Vitamin D deficiency   Hyperlipidemia  Patient was treated with remdesivir and IV Decadron.  As well as supplemental oxygen.  She has been off her oxygen today and saturating normal at 9697% she does not have any shortness of breath.  She says she still coughs a little bit when she takes a deep breath.  She has been afebrile. She is being discharged in improved condition she will complete her remdesivir IV treatment outpatient at the remdesivir outpatient clinic on October 31, 2019.  She is being discharged to complete her Decadron by mouth on October 31, 2019 Procedures:  None  Consultations:  None  Discharge Exam: BP 109/64 (BP Location: Left Arm)   Pulse 85   Temp 98 F (36.7 C) (Oral)   Resp 20   Ht 5\' 2"  (1.575 m)   Wt 88.5 kg   LMP 10/14/2019   SpO2 97%   BMI 35.67 kg/m   General: Alert oriented x3 slightly overweight Cardiovascular: Regular rate and rhythm no murmur no edema Respiratory: Clear to auscultation  Discharge Instructions You were cared for by a hospitalist during your hospital stay. If you have any questions about your discharge medications or the care you received while you were in the hospital after you are discharged, you can call the unit and asked to speak with the hospitalist on call if the hospitalist that took care of you is not available. Once you are discharged, your primary care physician will handle any further medical issues. Please note that NO REFILLS for any discharge medications will be  authorized once you are discharged, as it is imperative that you return to your primary care physician (or establish a relationship with a primary care physician if you do not have one) for your aftercare needs so that they can reassess your need for medications and monitor your lab values.  Discharge Instructions    Call MD for:  difficulty breathing, headache or visual disturbances   Complete by: As directed    Call MD  for:  temperature >100.4   Complete by: As directed    Diet - low sodium heart healthy   Complete by: As directed    Discharge instructions   Complete by: As directed    Outpatient remdesivir, final dose on  1/12 discharge to home to follow-up at [facility] as an outpatient   Increase activity slowly   Complete by: As directed      Allergies as of 10/28/2019      Reactions   Phentermine Swelling   After 3 days pt says tongue swells and was having trouble with speech   Bupropion       Medication List    TAKE these medications   albuterol 108 (90 Base) MCG/ACT inhaler Commonly known as: VENTOLIN HFA Inhale 2 puffs into the lungs every 6 (six) hours.   ascorbic acid 500 MG tablet Commonly known as: VITAMIN C Take 1 tablet (500 mg total) by mouth daily. Start taking on: October 29, 2019   aspirin EC 325 MG tablet Take 1 tablet (325 mg total) by mouth daily.   benzonatate 100 MG capsule Commonly known as: TESSALON Take 1-2 capsules (100-200 mg total) by mouth 3 (three) times daily as needed.   cefdinir 300 MG capsule Commonly known as: OMNICEF Take 1 capsule (300 mg total) by mouth 2 (two) times daily.   dexamethasone 6 MG tablet Commonly known as: DECADRON Take 1 tablet (6 mg total) by mouth 2 (two) times daily with a meal.   doxycycline 100 MG tablet Commonly known as: VIBRA-TABS Take 1 tablet (100 mg total) by mouth every 12 (twelve) hours.   ipratropium 17 MCG/ACT inhaler Commonly known as: ATROVENT HFA Inhale 2 puffs into the lungs every 6 (six) hours.   promethazine-dextromethorphan 6.25-15 MG/5ML syrup Commonly known as: PROMETHAZINE-DM Take 5 mLs by mouth at bedtime as needed for cough.   zinc sulfate 220 (50 Zn) MG capsule Take 1 capsule (220 mg total) by mouth daily. Start taking on: October 29, 2019      Allergies  Allergen Reactions  . Phentermine Swelling    After 3 days pt says tongue swells and was having trouble with speech  .  Bupropion       The results of significant diagnostics from this hospitalization (including imaging, microbiology, ancillary and laboratory) are listed below for reference.    Significant Diagnostic Studies: DG Chest Portable 1 View  Result Date: 10/26/2019 CLINICAL DATA:  COVID, dyspnea EXAM: PORTABLE CHEST 1 VIEW COMPARISON:  None. FINDINGS: The heart size and mediastinal contours are within normal limits. Multifocal bilateral heterogeneous airspace opacity, most conspicuous in the left midlung. The visualized skeletal structures are unremarkable. IMPRESSION: Multifocal bilateral heterogeneous airspace opacity, most conspicuous in the left midlung, in keeping with COVID-19. Electronically Signed   By: Eddie Candle M.D.   On: 10/26/2019 11:52   XR Finger Middle Left  Result Date: 10/18/2019 CLINICAL DATA:  Recheck fracture EXAM: LEFT MIDDLE FINGER 2+V COMPARISON:  09/26/2019, 09/09/2019 FINDINGS: Oblique fracture extending from the proximal shaft to the  head of the middle phalanx without significant change in alignment. Fracture lucency remains visible and not much bridging callus is evident. IMPRESSION: No significant change in alignment of the fracture involving the middle phalanx of third digit. Fracture lucency remains readily visible and not much bridging callus is evident in the interim. Electronically Signed   By: Donavan Foil M.D.   On: 10/18/2019 03:36    Microbiology: Recent Results (from the past 240 hour(s))  Urine culture     Status: Abnormal   Collection Time: 10/26/19 11:04 AM   Specimen: Urine, Clean Catch  Result Value Ref Range Status   Specimen Description   Final    URINE, CLEAN CATCH Performed at [facility], [place] [address] [place], [place] [address]    Special Requests   Final    NONE Performed at [facility], [place] [address] [place], [place] [address]    Culture MULTIPLE SPECIES PRESENT, SUGGEST RECOLLECTION (A)  Final     Report Status 10/27/2019 FINAL  Final  Blood Culture (routine x 2)     Status: None (Preliminary result)   Collection Time: 10/26/19  1:57 PM   Specimen: BLOOD LEFT HAND  Result Value Ref Range Status   Specimen Description   Final    BLOOD LEFT HAND Performed at [place] [address] [place], [place] [address]    Special Requests   Final    BOTTLES DRAWN AEROBIC ONLY Blood Culture results may not be optimal due to an inadequate volume of blood received in culture bottles Performed at [place] [address] [place], [place] [address]    Culture   Final    NO GROWTH 1 DAY Performed at [facility], [place] [address] [place], [place] [address]    Report Status PENDING  Incomplete     Labs: Basic Metabolic Panel: Recent Labs  Lab 10/26/19 1123 10/26/19 1444  NA 138  --   K 3.9  --   CL 104  --   CO2 24  --   GLUCOSE 95  --   BUN 15  --   CREATININE 0.93 0.93  CALCIUM 8.4*  --    Liver Function Tests: Recent Labs  Lab 10/26/19 1123  AST 21  ALT 14  ALKPHOS 43  BILITOT 0.5  PROT 7.1  ALBUMIN 3.7   No results for input(s): LIPASE, AMYLASE in the last 168 hours. No results for input(s): AMMONIA in the last 168 hours. CBC: Recent Labs  Lab 10/26/19 1123 10/26/19 1444 10/27/19 0201 10/28/19 0056  WBC 6.7 6.7 4.2 5.1  NEUTROABS 4.9  --  2.6 3.8  HGB 12.3 11.6* 11.9* 10.9*  HCT 37.8 36.5 38.4 34.7*  MCV 90.9 91.0 92.5 91.6  PLT 182 193 190 231   Cardiac Enzymes: No results for input(s): CKTOTAL, CKMB, CKMBINDEX, TROPONINI in the last 168 hours. BNP: BNP (last 3 results) No results for input(s): BNP in the last 8760 hours.  ProBNP (last 3 results) No results for input(s): PROBNP in the last 8760 hours.  CBG: Recent Labs  Lab 10/28/19 0724 10/28/19 1124  GLUCAP 139* 179*       Signed:  Cristal Deer, MD [facility] 10/28/2019, 3:43 PM

## 2019-10-29 ENCOUNTER — Encounter (HOSPITAL_COMMUNITY): Payer: Self-pay

## 2019-10-29 ENCOUNTER — Ambulatory Visit (HOSPITAL_COMMUNITY)
Admission: RE | Admit: 2019-10-29 | Discharge: 2019-10-29 | Disposition: A | Discharge status: Home / Self Care | Payer: 59 | Source: Ambulatory Visit | Attending: Pulmonary Disease | Admitting: Pulmonary Disease

## 2019-10-29 DIAGNOSIS — J1282 Pneumonia due to coronavirus disease 2019: Secondary | ICD-10-CM | POA: Diagnosis not present

## 2019-10-29 DIAGNOSIS — U071 COVID-19: Secondary | ICD-10-CM | POA: Insufficient documentation

## 2019-10-29 MED ORDER — SODIUM CHLORIDE 0.9 % IV SOLN
100.0000 mg | Freq: Once | INTRAVENOUS | Status: AC
  Administered 2019-10-29: 100 mg via INTRAVENOUS

## 2019-10-29 MED ORDER — METHYLPREDNISOLONE SODIUM SUCC 125 MG IJ SOLR: 125.0000 mg | Freq: Once | INTRAMUSCULAR | Status: DC | PRN

## 2019-10-29 MED ORDER — SODIUM CHLORIDE 0.9 % IV SOLN: INTRAVENOUS | Status: DC | PRN

## 2019-10-29 MED ORDER — EPINEPHRINE 0.3 MG/0.3ML IJ SOAJ: 0.3000 mg | Freq: Once | INTRAMUSCULAR | Status: DC | PRN

## 2019-10-29 MED ORDER — ALBUTEROL SULFATE HFA 108 (90 BASE) MCG/ACT IN AERS: 2.0000 | INHALATION_SPRAY | Freq: Once | RESPIRATORY_TRACT | Status: DC | PRN

## 2019-10-29 MED ORDER — SODIUM CHLORIDE 0.9 % IV SOLN
INTRAVENOUS | Status: AC
  Filled 2019-10-29: qty 20

## 2019-10-29 MED ORDER — DIPHENHYDRAMINE HCL 50 MG/ML IJ SOLN: 50.0000 mg | Freq: Once | INTRAMUSCULAR | Status: DC | PRN

## 2019-10-29 MED ORDER — FAMOTIDINE IN NACL 20-0.9 MG/50ML-% IV SOLN: 20.0000 mg | Freq: Once | INTRAVENOUS | Status: DC | PRN

## 2019-10-29 NOTE — Progress Notes (Signed)
  Diagnosis: COVID-19  Physician: Dr. Joya Gaskins  Procedure: Covid Infusion Clinic Med: remdesivir infusion.  Complications: No immediate complications noted.  Discharge: Discharged home   Rose Bell [name] 10/29/2019

## 2019-10-29 NOTE — Discharge Instructions (Signed)
10 Things You Can Do to Manage Your COVID-19 Symptoms at Home If you have possible or confirmed COVID-19: 1. Stay home from work and school. And stay away from other public places. If you must go out, avoid using any kind of public transportation, ridesharing, or taxis. 2. Monitor your symptoms carefully. If your symptoms get worse, call your healthcare provider immediately. 3. Get rest and stay hydrated. 4. If you have a medical appointment, call the healthcare provider ahead of time and tell them that you have or may have COVID-19. 5. For medical emergencies, call 911 and notify the dispatch personnel that you have or may have COVID-19. 6. Cover your cough and sneezes with a tissue or use the inside of your elbow. 7. Wash your hands often with soap and water for at least 20 seconds or clean your hands with an alcohol-based hand sanitizer that contains at least 60% alcohol. 8. As much as possible, stay in a specific room and away from other people in your home. Also, you should use a separate bathroom, if available. If you need to be around other people in or outside of the home, wear a mask. 9. Avoid sharing personal items with other people in your household, like dishes, towels, and bedding. 10. Clean all surfaces that are touched often, like counters, tabletops, and doorknobs. Use household cleaning sprays or wipes according to the label instructions. cdc.gov/coronavirus 04/19/2019 This information is not intended to replace advice given to you by your health care provider. Make sure you discuss any questions you have with your health care provider. Document Revised: 09/21/2019 Document Reviewed: 09/21/2019 Elsevier Patient Education  2020 Elsevier Inc.  

## 2019-10-30 ENCOUNTER — Other Ambulatory Visit: Payer: Self-pay | Admitting: *Deleted

## 2019-10-30 ENCOUNTER — Telehealth: Payer: Self-pay

## 2019-10-30 ENCOUNTER — Ambulatory Visit (HOSPITAL_COMMUNITY)
Admission: RE | Admit: 2019-10-30 | Discharge: 2019-10-30 | Disposition: A | Discharge status: Home / Self Care | Payer: 59 | Source: Ambulatory Visit | Attending: Pulmonary Disease | Admitting: Pulmonary Disease

## 2019-10-30 ENCOUNTER — Encounter: Payer: Self-pay | Admitting: Family Medicine

## 2019-10-30 DIAGNOSIS — U071 COVID-19: Secondary | ICD-10-CM | POA: Diagnosis not present

## 2019-10-30 DIAGNOSIS — J1282 Pneumonia due to coronavirus disease 2019: Secondary | ICD-10-CM | POA: Diagnosis not present

## 2019-10-30 MED ORDER — FAMOTIDINE IN NACL 20-0.9 MG/50ML-% IV SOLN: 20.0000 mg | Freq: Once | INTRAVENOUS | Status: DC | PRN

## 2019-10-30 MED ORDER — ALBUTEROL SULFATE HFA 108 (90 BASE) MCG/ACT IN AERS: 2.0000 | INHALATION_SPRAY | Freq: Once | RESPIRATORY_TRACT | Status: DC | PRN

## 2019-10-30 MED ORDER — SODIUM CHLORIDE 0.9 % IV SOLN
INTRAVENOUS | Status: AC
  Filled 2019-10-30: qty 20

## 2019-10-30 MED ORDER — METHYLPREDNISOLONE SODIUM SUCC 125 MG IJ SOLR: 125.0000 mg | Freq: Once | INTRAMUSCULAR | Status: DC | PRN

## 2019-10-30 MED ORDER — SODIUM CHLORIDE 0.9 % IV SOLN: INTRAVENOUS | Status: DC | PRN

## 2019-10-30 MED ORDER — EPINEPHRINE 0.3 MG/0.3ML IJ SOAJ: 0.3000 mg | Freq: Once | INTRAMUSCULAR | Status: DC | PRN

## 2019-10-30 MED ORDER — DIPHENHYDRAMINE HCL 50 MG/ML IJ SOLN: 50.0000 mg | Freq: Once | INTRAMUSCULAR | Status: DC | PRN

## 2019-10-30 MED ORDER — SODIUM CHLORIDE 0.9 % IV SOLN
100.0000 mg | Freq: Once | INTRAVENOUS | Status: AC
  Administered 2019-10-30: 100 mg via INTRAVENOUS

## 2019-10-30 NOTE — Progress Notes (Signed)
  Diagnosis: COVID-19  Physician: Dr. Joya Gaskins  Procedure: Covid Infusion Clinic Med: remdesivir infusion.  Complications: No immediate complications noted.  Discharge: Discharged home   Rose Bell 10/30/2019

## 2019-10-30 NOTE — Telephone Encounter (Signed)
Transition Care Management Follow-up Telephone Call   Date discharged? 1.9.21   How have you been since you were released from the hospital? Patient states that she has finished her infusions today, but overall still tired.   Do you understand why you were in the hospital? Yes, patient diagnosed with pneumonia   Do you understand the discharge instructions? yes    Where were you discharged to? Home, taking care of self   Items Reviewed:  Medications reviewed: yes  Allergies reviewed: yes  Dietary changes reviewed: yes  Referrals reviewed: yes   Functional Questionnaire:   Activities of Daily Living (ADLs):   She states they are independent in the following: ambulation, bathing and hygiene, feeding, continence, grooming, toileting and dressing States they require assistance with the following: N/A   Any transportation issues/concerns?: no   Any patient concerns? Patient is wanting to know how much longer she should continue using the inhalers prescribed during her hospitalization. Patient became aggravated once I told her that her hosp f/u would have to be done virtual. States that she is wanting PCP to be about to listen to her lungs. Informed patient that I would route message to provider, her COVID test in hospital was negative.     Confirmed importance and date/time of follow-up visits scheduled yes  Provider Appointment booked with Annye Asa, MD 1.15.21 @ 3 PM  Confirmed with patient if condition begins to worsen call PCP or go to the ER.  Patient was given the office number and encouraged to call back with question or concerns.  : yes

## 2019-10-30 NOTE — Discharge Instructions (Signed)
10 Things You Can Do to Manage Your COVID-19 Symptoms at Home If you have possible or confirmed COVID-19: 1. Stay home from work and school. And stay away from other public places. If you must go out, avoid using any kind of public transportation, ridesharing, or taxis. 2. Monitor your symptoms carefully. If your symptoms get worse, call your healthcare provider immediately. 3. Get rest and stay hydrated. 4. If you have a medical appointment, call the healthcare provider ahead of time and tell them that you have or may have COVID-19. 5. For medical emergencies, call 911 and notify the dispatch personnel that you have or may have COVID-19. 6. Cover your cough and sneezes with a tissue or use the inside of your elbow. 7. Wash your hands often with soap and water for at least 20 seconds or clean your hands with an alcohol-based hand sanitizer that contains at least 60% alcohol. 8. As much as possible, stay in a specific room and away from other people in your home. Also, you should use a separate bathroom, if available. If you need to be around other people in or outside of the home, wear a mask. 9. Avoid sharing personal items with other people in your household, like dishes, towels, and bedding. 10. Clean all surfaces that are touched often, like counters, tabletops, and doorknobs. Use household cleaning sprays or wipes according to the label instructions. cdc.gov/coronavirus 04/19/2019 This information is not intended to replace advice given to you by your health care provider. Make sure you discuss any questions you have with your health care provider. Document Revised: 09/21/2019 Document Reviewed: 09/21/2019 Elsevier Patient Education  2020 Elsevier Inc.  

## 2019-10-30 NOTE — Patient Outreach (Signed)
[name]) Care Management  10/30/2019  [name] 1981/07/26 [id number]   Transition of care case closure    Patient was Hospitalized at [facility] from 1/7-1/9, for Pneumonia due to covid 19, she was discharged home on 10/28/19. She will receive Interactive voice response calls through [facility] system  to assess for discharge needs.    Plan Will close to [facility] care management at this time.     Joylene Draft, RN, [place] Management Coordinator  [phone]- Mobile [phone]- Toll Free Main Office

## 2019-11-01 LAB — CULTURE, BLOOD (ROUTINE X 2): Culture: NO GROWTH

## 2019-11-02 ENCOUNTER — Telehealth: Payer: 59 | Admitting: Family Medicine

## 2019-11-02 ENCOUNTER — Other Ambulatory Visit: Payer: Self-pay

## 2019-11-02 LAB — CULTURE, BLOOD (ROUTINE X 2): Culture: NO GROWTH

## 2019-11-03 ENCOUNTER — Encounter: Payer: Self-pay | Admitting: Family Medicine

## 2019-11-03 ENCOUNTER — Other Ambulatory Visit: Payer: Self-pay

## 2019-11-03 ENCOUNTER — Ambulatory Visit (INDEPENDENT_AMBULATORY_CARE_PROVIDER_SITE_OTHER): Payer: 59 | Admitting: Family Medicine

## 2019-11-03 DIAGNOSIS — J1282 Pneumonia due to coronavirus disease 2019: Secondary | ICD-10-CM

## 2019-11-03 DIAGNOSIS — U071 COVID-19: Secondary | ICD-10-CM

## 2019-11-03 NOTE — Progress Notes (Signed)
Virtual Visit via Video   I connected with patient on 11/03/19 at  3:00 PM EST by a video enabled telemedicine application and verified that I am speaking with the correct person using two identifiers.  Location patient: Home Location provider: Acupuncturist, Office Persons participating in the virtual visit: Patient, Provider, [place] (Jess B)  I discussed the limitations of evaluation and management by telemedicine and the availability of in person appointments. The patient expressed understanding and agreed to proceed.  Subjective:   HPI:   Hospital f/u- pt was admitted 1/7-1/9 w/ COVID pneumonia.  First tested + on 12/31.  She was treated w/ Remdesivir and IV Decadron as well as O2.  She was able to wean off O2 prior to d/c.  She completed her Remdesivir infusions at the outpt clinic.  She was also d/c'd on Doxy (finished) and Omnicef (stopped taking).  Pt reports how she feels depends on the day.  Denies SOB.  + fatigue, 'brain fog', overall weak.  Pt is on B12, Omega 3, Vit C.  Not taking Vit D and Zinc regularly.  'the only thing that's really bothering me is my brain'.  Pt reports some anxiety, 'I'm trying to talk myself through it'.  Pt is trying to avoid medications to remain 'clear headed'.    ROS:   See pertinent positives and negatives per HPI.  Patient Active Problem List   Diagnosis Date Noted  . Pneumonia due to COVID-19 virus 10/26/2019  . Fracture of finger, middle phalanx, left, closed 09/26/2019  . Orgasmic headache 01/08/2016  . Obesity (BMI 30.0-34.9) 01/02/2015  . Hyperlipidemia 08/10/2014  . GERD (gastroesophageal reflux disease) 03/04/2012  . General medical examination 06/26/2011  . Screening for malignant neoplasm of the cervix 06/26/2011  . Vitamin D deficiency 05/30/2009  . Anxiety state 05/30/2009  . B12 deficiency 05/23/2009    Social History   Tobacco Use  . Smoking status: Never Smoker  . Smokeless tobacco: Never Used  Substance Use  Topics  . Alcohol use: Yes    Comment: socially    Current Outpatient Medications:  .  ascorbic acid (VITAMIN C) 500 MG tablet, Take 1 tablet (500 mg total) by mouth daily., Disp: 30 tablet, Rfl: 0 .  omega-3 acid ethyl esters (LOVAZA) 1 g capsule, Take by mouth 2 (two) times daily., Disp: , Rfl:  .  Thiamine HCl (VITAMIN B-1) 250 MG tablet, Take 250 mg by mouth daily., Disp: , Rfl:  .  albuterol (VENTOLIN HFA) 108 (90 Base) MCG/ACT inhaler, Inhale 2 puffs into the lungs every 6 (six) hours. (Patient not taking: Reported on 11/03/2019), Disp: 50 g, Rfl: 0 .  aspirin EC 325 MG tablet, Take 1 tablet (325 mg total) by mouth daily. (Patient not taking: Reported on 11/03/2019), Disp: 100 tablet, Rfl: 3 .  benzonatate (TESSALON) 100 MG capsule, Take 1-2 capsules (100-200 mg total) by mouth 3 (three) times daily as needed. (Patient not taking: Reported on 11/03/2019), Disp: 60 capsule, Rfl: 0 .  cefdinir (OMNICEF) 300 MG capsule, Take 1 capsule (300 mg total) by mouth 2 (two) times daily. (Patient not taking: Reported on 11/03/2019), Disp: 14 capsule, Rfl: 0 .  dexamethasone (DECADRON) 6 MG tablet, Take 1 tablet (6 mg total) by mouth 2 (two) times daily with a meal. (Patient not taking: Reported on 11/03/2019), Disp: 6 tablet, Rfl: 0 .  doxycycline (VIBRA-TABS) 100 MG tablet, Take 1 tablet (100 mg total) by mouth every 12 (twelve) hours. (Patient not taking: Reported on  11/03/2019), Disp: 10 tablet, Rfl: 0 .  ipratropium (ATROVENT HFA) 17 MCG/ACT inhaler, Inhale 2 puffs into the lungs every 6 (six) hours. (Patient not taking: Reported on 11/03/2019), Disp: 1 Inhaler, Rfl: 12 .  promethazine-dextromethorphan (PROMETHAZINE-DM) 6.25-15 MG/5ML syrup, Take 5 mLs by mouth at bedtime as needed for cough. (Patient not taking: Reported on 11/03/2019), Disp: 100 mL, Rfl: 0 .  zinc sulfate 220 (50 Zn) MG capsule, Take 1 capsule (220 mg total) by mouth daily. (Patient not taking: Reported on 11/03/2019), Disp: 30 capsule, Rfl:  0  Allergies  Allergen Reactions  . Phentermine Swelling    After 3 days pt says tongue swells and was having trouble with speech  . Bupropion     Objective:   LMP 10/14/2019  AAOx3, NAD NCAT, EOMI No obvious CN deficits Coloring WNL Pt is able to speak clearly, coherently without shortness of breath or increased work of breathing.  Thought process is linear.  Mood is appropriate.   Assessment and Plan:   COVID PNA- improving.  Pt denies SOB but is struggling w/ ongoing fatigue, general weakness, and 'brain fog'.  Encouraged her to resume Vit D, Vit C, Zinc, increase fluids, try and rest as much as possible.  She admits to some anxiety over recent illness (husband is still sick) but rather than medication she is trying to work through it.  Reviewed supportive care and red flags that should prompt return.  Pt expressed understanding and is in agreement w/ plan.    Annye Asa, MD 11/03/2019

## 2019-11-03 NOTE — Progress Notes (Signed)
I have discussed the procedure for the virtual visit with the patient who has given consent to proceed with assessment and treatment.  Patient unable to get vital signs. Fritz Pickerel, CMA

## 2019-11-11 ENCOUNTER — Other Ambulatory Visit: Payer: Self-pay | Admitting: Family Medicine

## 2019-11-11 DIAGNOSIS — J1282 Pneumonia due to coronavirus disease 2019: Secondary | ICD-10-CM

## 2019-11-11 DIAGNOSIS — U071 COVID-19: Secondary | ICD-10-CM

## 2019-11-23 ENCOUNTER — Other Ambulatory Visit: Payer: Self-pay

## 2019-11-23 ENCOUNTER — Ambulatory Visit (INDEPENDENT_AMBULATORY_CARE_PROVIDER_SITE_OTHER): Payer: 59 | Admitting: Family Medicine

## 2019-11-23 DIAGNOSIS — Z713 Dietary counseling and surveillance: Secondary | ICD-10-CM

## 2019-11-23 NOTE — Patient Instructions (Addendum)
Goals:  1.Get at least 90 min of exercise weekly. (Remember the 5-minute rule!) 2. Get breakfast on days you exercise in the morning 3.Obtain at least 2 vegetable servings per day 6 days a week.  Document  progress on each of your goals in your notebook.   Set aside time right after work to plan for tomorrow:  What arrangements do you need to make for melas tomorrow?  What will you do for a workout if it's a workout day, and what will you have for breakfast?  Follow-up: TBD.

## 2019-11-23 NOTE — Progress Notes (Signed)
Telehealth Encounter I connected with Bonita Brindisi (MRN [id number]) on 11/23/2019 by Doxy[employer]me video-enabled, HIPAA-compliant telemedicine application, verified that I am speaking with the correct person using two identifiers, and that the patient was in a private environment conducive to confidentiality.  The patient agreed to proceed.  Provider was Kennith Center, PhD, RD, LDN, CEDRD Provider(s) located at [facility] during this telehealth encounter; patient was at home  Appt start time: 1130 end time: 1230 (1 hour)  Reason for telehealth visit: Medical Nutrition Therapy for weight management and HLD. (Preventive Med visit; Dietary Counseling and Surveillance.)  Relevant history/background: Allena feels she's been working on wt loss "forever," and has seldom realized success.  Also concerned re. h/o hypercholesterolemia.    Assessment: Dayshia was hospitalized with COVID last month.  She feels relatively back to normal now, and wants to get back on track with her nutrition and exercise goals.   Breakfast: Has eaten breakfast <3 X wk.   Vegetables 2 svngs/day: Has been meeting goal most days.   Exercise: Has met with her personal trainer once each of the past 3 weeks.  Exercised additional 15-20 minutes at home a couple times last week.   24-hr recall:  (Up at 7 AM) B ( AM)-  Water Snk ( AM)-  2 c coffee, creamer L (3 PM)-  10 [employer] chx nuggets, diet Coke Snk ( PM)-  --- D (8 PM)-  Rotisserie chx brst, 1 boiled pot, 1 tsp butter, 2 tbsp sour cream, salad, 2 tsbp drsng, water Snk ( PM)-  --- Typical day? No. Has been adding protein powder in coffee on some mornings, and otherwise rarely eating breakfast.    Intervention: Reviewed recent diet and exercise history.  Re-established goals and means of documenting progress.    For goals and recommendations, see Patient Instructions.   Follow-up: Telehealth visit in about 3 weeks.     [place] Natarajan,JEANNIE

## 2019-11-28 ENCOUNTER — Ambulatory Visit: Payer: 59 | Admitting: Sports Medicine

## 2019-12-06 ENCOUNTER — Other Ambulatory Visit: Payer: Self-pay

## 2019-12-06 ENCOUNTER — Ambulatory Visit (INDEPENDENT_AMBULATORY_CARE_PROVIDER_SITE_OTHER): Payer: 59 | Admitting: Family Medicine

## 2019-12-06 ENCOUNTER — Other Ambulatory Visit (HOSPITAL_COMMUNITY)
Admission: RE | Admit: 2019-12-06 | Discharge: 2019-12-06 | Disposition: A | Discharge status: Home / Self Care | Payer: 59 | Source: Ambulatory Visit | Attending: Family Medicine | Admitting: Family Medicine

## 2019-12-06 ENCOUNTER — Encounter: Payer: Self-pay | Admitting: Family Medicine

## 2019-12-06 VITALS — BP 131/82 | HR 72 | Temp 97.9°F | Resp 16 | Ht 63.0 in | Wt 193.4 lb

## 2019-12-06 DIAGNOSIS — Z1231 Encounter for screening mammogram for malignant neoplasm of breast: Secondary | ICD-10-CM | POA: Diagnosis not present

## 2019-12-06 DIAGNOSIS — E559 Vitamin D deficiency, unspecified: Secondary | ICD-10-CM | POA: Diagnosis not present

## 2019-12-06 DIAGNOSIS — Z124 Encounter for screening for malignant neoplasm of cervix: Secondary | ICD-10-CM | POA: Diagnosis not present

## 2019-12-06 DIAGNOSIS — E669 Obesity, unspecified: Secondary | ICD-10-CM

## 2019-12-06 DIAGNOSIS — Z0000 Encounter for general adult medical examination without abnormal findings: Secondary | ICD-10-CM | POA: Diagnosis not present

## 2019-12-06 DIAGNOSIS — E538 Deficiency of other specified B group vitamins: Secondary | ICD-10-CM

## 2019-12-06 LAB — CBC WITH DIFFERENTIAL/PLATELET
Basophils Absolute: 0 10*3/uL (ref 0.0–0.1)
Basophils Relative: 0.4 % (ref 0.0–3.0)
Eosinophils Absolute: 0.1 10*3/uL (ref 0.0–0.7)
Eosinophils Relative: 1.9 % (ref 0.0–5.0)
HCT: 38.2 % (ref 36.0–46.0)
Hemoglobin: 12.8 g/dL (ref 12.0–15.0)
Lymphocytes Relative: 34.4 % (ref 12.0–46.0)
Lymphs Abs: 2.1 10*3/uL (ref 0.7–4.0)
MCHC: 33.4 g/dL (ref 30.0–36.0)
MCV: 87.8 fl (ref 78.0–100.0)
Monocytes Absolute: 0.4 10*3/uL (ref 0.1–1.0)
Monocytes Relative: 6.4 % (ref 3.0–12.0)
Neutro Abs: 3.5 10*3/uL (ref 1.4–7.7)
Neutrophils Relative %: 56.9 % (ref 43.0–77.0)
Platelets: 305 10*3/uL (ref 150.0–400.0)
RBC: 4.35 Mil/uL (ref 3.87–5.11)
RDW: 14.4 % (ref 11.5–15.5)
WBC: 6.2 10*3/uL (ref 4.0–10.5)

## 2019-12-06 LAB — LIPID PANEL
Cholesterol: 308 mg/dL — ABNORMAL HIGH (ref 0–200)
HDL: 49.9 mg/dL (ref >39.00)
LDL Cholesterol: 229 mg/dL — ABNORMAL HIGH (ref 0–99)
NonHDL: 257.69
Total CHOL/HDL Ratio: 6
Triglycerides: 144 mg/dL (ref 0.0–149.0)
VLDL: 28.8 mg/dL (ref 0.0–40.0)

## 2019-12-06 LAB — BASIC METABOLIC PANEL
BUN: 10 mg/dL (ref 6–23)
CO2: 27 mEq/L (ref 19–32)
Calcium: 9.6 mg/dL (ref 8.4–10.5)
Chloride: 103 mEq/L (ref 96–112)
Creatinine, Ser: 0.66 mg/dL (ref 0.40–1.20)
GFR: 98.88 mL/min (ref >60.00)
Glucose, Bld: 94 mg/dL (ref 70–99)
Potassium: 3.9 mEq/L (ref 3.5–5.1)
Sodium: 140 mEq/L (ref 135–145)

## 2019-12-06 LAB — HEPATIC FUNCTION PANEL
ALT: 11 U/L (ref 0–35)
AST: 13 U/L (ref 0–37)
Albumin: 4.5 g/dL (ref 3.5–5.2)
Alkaline Phosphatase: 60 U/L (ref 39–117)
Bilirubin, Direct: 0.1 mg/dL (ref 0.0–0.3)
Total Bilirubin: 0.4 mg/dL (ref 0.2–1.2)
Total Protein: 6.8 g/dL (ref 6.0–8.3)

## 2019-12-06 LAB — TSH: TSH: 2.98 u[IU]/mL (ref 0.35–4.50)

## 2019-12-06 LAB — VITAMIN D 25 HYDROXY (VIT D DEFICIENCY, FRACTURES): VITD: 18.12 ng/mL — ABNORMAL LOW (ref 30.00–100.00)

## 2019-12-06 LAB — VITAMIN B12: Vitamin B-12: 302 pg/mL (ref 211–911)

## 2019-12-06 NOTE — Assessment & Plan Note (Signed)
Check labs and replete prn. 

## 2019-12-06 NOTE — Assessment & Plan Note (Signed)
Pt has lost 6 lbs.  Applauded her efforts.  Encouraged her to continue healthy diet and regular exercise.  Will follow.

## 2019-12-06 NOTE — Progress Notes (Signed)
   Subjective:    Patient ID: Rose Bell, female    DOB: Jan 05, 1981, 39 y.o.   MRN: [id number]  HPI CPE- due for pap, to start mammos now that she is 53.  UTD on immunizations.  Down 6 lbs since last visit   Review of Systems Patient reports no vision/ hearing changes, adenopathy,fever, persistant/recurrent hoarseness , swallowing issues, chest pain, palpitations, edema, persistant/recurrent cough, hemoptysis, dyspnea (rest/exertional/paroxysmal nocturnal), gastrointestinal bleeding (melena, rectal bleeding), abdominal pain, significant heartburn, bowel changes, GU symptoms (dysuria, hematuria, incontinence),  syncope, focal weakness, memory loss, numbness & tingling, skin/hair/nail changes, abnormal bruising or bleeding, or depression.  Pt reports nodule on vaginal wall- not tender Statin was causing anxiety  This visit occurred during the SARS-CoV-2 public health emergency.  Safety protocols were in place, including screening questions prior to the visit, additional usage of staff PPE, and extensive cleaning of exam room while observing appropriate contact time as indicated for disinfecting solutions.       Objective:   Physical Exam  General Appearance:    Alert, cooperative, no distress, appears stated age  Head:    Normocephalic, without obvious abnormality, atraumatic  Eyes:    PERRL, conjunctiva/corneas clear, EOM's intact, fundi    benign, both eyes  Ears:    Normal TM's and external ear canals, both ears  Nose:   Deferred due to COVID  Throat:   Neck:   Supple, symmetrical, trachea midline, no adenopathy;    Thyroid: no enlargement/tenderness/nodules  Back:     Symmetric, no curvature, ROM normal, no CVA tenderness  Lungs:     Clear to auscultation bilaterally, respirations unlabored  Chest Wall:    No tenderness or deformity   Heart:    Regular rate and rhythm, S1 and S2 normal, no murmur, rub   or gallop  Breast Exam:    No tenderness, masses, or nipple abnormality    Abdomen:     Soft, non-tender, bowel sounds active all four quadrants,    no masses, no organomegaly  Genitalia:    External genitalia normal, cervix normal w/ smooth cyst at 12 o'clock, no CMT, uterus in normal size and position, adnexa w/out mass or tenderness, mucosa pink and moist, no lesions or discharge present.  No nodule palpated today  Rectal:    Normal external appearance  Extremities:   Extremities normal, atraumatic, no cyanosis or edema  Pulses:   2+ and symmetric all extremities  Skin:   Skin color, texture, turgor normal, no rashes or lesions  Lymph nodes:   Cervical, supraclavicular, and axillary nodes normal  Neurologic:   CNII-XII intact, normal strength, sensation and reflexes    throughout          Assessment & Plan:

## 2019-12-06 NOTE — Assessment & Plan Note (Signed)
Pt's PE WNL w/ exception of obesity.  Pap collected today.  mammo ordered.  No palpable abnormality on vaginal wall.  Offered pelvic US to assess- pt declined.  Check labs.  Anticipatory guidance provided.

## 2019-12-06 NOTE — Patient Instructions (Addendum)
Follow up in 6 months to recheck cholesterol and weight loss We'll notify you of your lab results and make any changes if needed We'll call you with your mammogram appt If you change your mind about the ultrasound- let me know!  Peace of mind can be worth it! Continue to work on healthy diet and regular exercise- you're doing great! Call with any questions or concerns Stay safe!  Stay Healthy!

## 2019-12-07 ENCOUNTER — Other Ambulatory Visit: Payer: Self-pay | Admitting: General Practice

## 2019-12-07 DIAGNOSIS — E785 Hyperlipidemia, unspecified: Secondary | ICD-10-CM

## 2019-12-07 LAB — CYTOLOGY - PAP
Adequacy: ABSENT
Comment: NEGATIVE
Diagnosis: NEGATIVE
High risk HPV: NEGATIVE

## 2019-12-07 MED ORDER — VITAMIN D (ERGOCALCIFEROL) 1.25 MG (50000 UNIT) PO CAPS: 50000.0000 [IU] | ORAL_CAPSULE | ORAL | 0 refills | Status: DC

## 2019-12-07 MED ORDER — ROSUVASTATIN CALCIUM 20 MG PO TABS: 20.0000 mg | ORAL_TABLET | Freq: Every day | ORAL | 6 refills | Status: DC

## 2019-12-08 ENCOUNTER — Encounter: Payer: Self-pay | Admitting: General Practice

## 2019-12-13 MED FILL — VIT D2 1.25 MG (50,000 UNIT: 1.25 MG | 84 days supply | Qty: 12 | Fill #0

## 2019-12-13 MED FILL — ROSUVASTATIN CALCIUM 20 MG: 20 | 30 days supply | Qty: 30 | Fill #0

## 2020-01-18 ENCOUNTER — Encounter: Payer: Self-pay | Admitting: Family Medicine

## 2020-01-25 ENCOUNTER — Encounter: Payer: Self-pay | Admitting: Family Medicine

## 2020-01-29 ENCOUNTER — Telehealth: Payer: Self-pay | Admitting: Family Medicine

## 2020-01-29 NOTE — Telephone Encounter (Signed)
Pt calling to see if we received her mychart messages.  Pt checked her mychart and confirmed that she saw the response from Dr Birdie Riddle (she never received a notification).  Appt scheduled.

## 2020-01-31 MED FILL — ROSUVASTATIN CALCIUM 20 MG: 20 | 30 days supply | Qty: 30 | Fill #1

## 2020-02-02 ENCOUNTER — Encounter: Payer: Self-pay | Admitting: Family Medicine

## 2020-02-02 ENCOUNTER — Ambulatory Visit: Payer: 59 | Admitting: Family Medicine

## 2020-02-02 ENCOUNTER — Other Ambulatory Visit: Payer: Self-pay

## 2020-02-02 VITALS — BP 121/82 | HR 76 | Temp 98.0°F | Resp 16 | Ht 62.0 in | Wt 195.5 lb

## 2020-02-02 DIAGNOSIS — L659 Nonscarring hair loss, unspecified: Secondary | ICD-10-CM | POA: Diagnosis not present

## 2020-02-02 NOTE — Patient Instructions (Signed)
Follow up as needed or as scheduled We'll notify you of your lab results and make any changes if needed HOLD the Crestor for the next month and let me know if the hair loss slows down START Biotin or similar vitamin for skin/hair/nails Call with any questions or concerns Have a great weekend!

## 2020-02-02 NOTE — Progress Notes (Signed)
   Subjective:    Patient ID: Rose Bell, female    DOB: [birth year], 38 y.o.   MRN: [id number]  HPI Hair loss- pt says that hair is thinner on temples, on top.  Pt says husband noticed 'my hair is everywhere'.  Will wake up and pillow is covered in hair.  Only difference in medication is Crestor- started ~1 month ago.  sxs started 'a few months ago'.  Was not having issue at time of appt in Feb   Review of Systems For ROS see HPI   This visit occurred during the SARS-CoV-2 public health emergency.  Safety protocols were in place, including screening questions prior to the visit, additional usage of staff PPE, and extensive cleaning of exam room while observing appropriate contact time as indicated for disinfecting solutions.       Objective:   Physical Exam Vitals reviewed.  Constitutional:      General: She is not in acute distress.    Appearance: Normal appearance. She is not ill-appearing.  HENT:     Head: Normocephalic and atraumatic.  Skin:    General: Skin is warm and dry.     Comments: Bitemporal hair thinning  Neurological:     General: No focal deficit present.     Mental Status: She is alert and oriented to person, place, and time.  Psychiatric:        Mood and Affect: Mood normal.        Behavior: Behavior normal.        Thought Content: Thought content normal.           Assessment & Plan:  Hair loss- new in the last 2 months.  Unclear if this started prior to initiating Crestor or if this is a rare side effect of medication.  Will check labs to look for underlying cause.  Start daily Biotin or hair/skin/nails supplement.  Hold Crestor to see if hair loss slows.  Pt expressed understanding and is in agreement w/ plan.

## 2020-02-03 LAB — EXTRA LAV TOP TUBE

## 2020-02-03 LAB — TSH: TSH: 1.52 mIU/L

## 2020-02-03 LAB — IRON,TIBC AND FERRITIN PANEL
%SAT: 20 % (calc) (ref 16–45)
Ferritin: 20 ng/mL (ref 16–154)
Iron: 51 ug/dL (ref 40–190)
TIBC: 253 mcg/dL (calc) (ref 250–450)

## 2020-02-03 LAB — T3, FREE: T3, Free: 3.4 pg/mL (ref 2.3–4.2)

## 2020-02-03 LAB — T4, FREE: Free T4: 1.3 ng/dL (ref 0.8–1.8)

## 2020-02-03 LAB — VITAMIN D 25 HYDROXY (VIT D DEFICIENCY, FRACTURES): Vit D, 25-Hydroxy: 31 ng/mL (ref 30–100)

## 2020-02-05 ENCOUNTER — Other Ambulatory Visit: Payer: Self-pay | Admitting: General Practice

## 2020-02-05 ENCOUNTER — Encounter: Payer: Self-pay | Admitting: Family Medicine

## 2020-02-05 MED ORDER — VITAMIN D (ERGOCALCIFEROL) 1.25 MG (50000 UNIT) PO CAPS: 50000.0000 [IU] | ORAL_CAPSULE | ORAL | 0 refills | Status: DC

## 2020-03-19 ENCOUNTER — Encounter: Payer: Self-pay | Admitting: Family Medicine

## 2020-03-19 DIAGNOSIS — E785 Hyperlipidemia, unspecified: Secondary | ICD-10-CM

## 2020-03-21 ENCOUNTER — Telehealth: Payer: Self-pay | Admitting: Family Medicine

## 2020-03-21 NOTE — Telephone Encounter (Signed)
I called patient 03/21/20 to schedule initial visit to be seen at [facility] from referral workqueue. The patient didn't answer so I left voicemail for patient to return call to get that appointment scheduled

## 2020-03-26 ENCOUNTER — Other Ambulatory Visit: Payer: Self-pay | Admitting: General Practice

## 2020-03-26 MED ORDER — EZETIMIBE 10 MG PO TABS: 10.0000 mg | ORAL_TABLET | Freq: Every day | ORAL | 6 refills | Status: DC

## 2020-04-24 DIAGNOSIS — M62838 Other muscle spasm: Secondary | ICD-10-CM | POA: Diagnosis not present

## 2020-05-01 ENCOUNTER — Encounter: Payer: Self-pay | Admitting: Obstetrics and Gynecology

## 2020-05-03 MED FILL — EZETIMIBE 10 MG TABS: 10 | 30 days supply | Qty: 30 | Fill #1

## 2020-06-14 MED FILL — EZETIMIBE 10 MG TABS: 10 | 30 days supply | Qty: 30 | Fill #2

## 2020-11-08 DIAGNOSIS — G5603 Carpal tunnel syndrome, bilateral upper limbs: Secondary | ICD-10-CM | POA: Diagnosis not present

## 2020-11-08 DIAGNOSIS — G5601 Carpal tunnel syndrome, right upper limb: Secondary | ICD-10-CM | POA: Diagnosis not present

## 2020-11-08 DIAGNOSIS — G5602 Carpal tunnel syndrome, left upper limb: Secondary | ICD-10-CM | POA: Diagnosis not present

## 2020-11-08 DIAGNOSIS — M542 Cervicalgia: Secondary | ICD-10-CM | POA: Diagnosis not present

## 2020-11-08 DIAGNOSIS — M25511 Pain in right shoulder: Secondary | ICD-10-CM | POA: Diagnosis not present

## 2020-11-14 ENCOUNTER — Other Ambulatory Visit: Payer: Self-pay

## 2020-11-14 ENCOUNTER — Ambulatory Visit
Admission: RE | Admit: 2020-11-14 | Discharge: 2020-11-14 | Disposition: A | Discharge status: Home / Self Care | Payer: 59 | Source: Ambulatory Visit | Attending: Family Medicine | Admitting: Family Medicine

## 2020-11-14 DIAGNOSIS — Z1231 Encounter for screening mammogram for malignant neoplasm of breast: Secondary | ICD-10-CM

## 2020-11-15 ENCOUNTER — Other Ambulatory Visit: Payer: Self-pay | Admitting: Family Medicine

## 2020-11-15 DIAGNOSIS — R928 Other abnormal and inconclusive findings on diagnostic imaging of breast: Secondary | ICD-10-CM

## 2020-11-22 DIAGNOSIS — G5603 Carpal tunnel syndrome, bilateral upper limbs: Secondary | ICD-10-CM | POA: Diagnosis not present

## 2020-11-27 ENCOUNTER — Ambulatory Visit
Admission: RE | Admit: 2020-11-27 | Discharge: 2020-11-27 | Disposition: A | Discharge status: Home / Self Care | Payer: 59 | Source: Ambulatory Visit | Attending: Family Medicine | Admitting: Family Medicine

## 2020-11-27 ENCOUNTER — Other Ambulatory Visit: Payer: Self-pay | Admitting: Family Medicine

## 2020-11-27 ENCOUNTER — Other Ambulatory Visit: Payer: Self-pay

## 2020-11-27 DIAGNOSIS — R928 Other abnormal and inconclusive findings on diagnostic imaging of breast: Secondary | ICD-10-CM

## 2020-11-27 DIAGNOSIS — N6489 Other specified disorders of breast: Secondary | ICD-10-CM | POA: Diagnosis not present

## 2020-11-29 ENCOUNTER — Other Ambulatory Visit: Payer: Self-pay

## 2020-11-29 ENCOUNTER — Ambulatory Visit
Admission: RE | Admit: 2020-11-29 | Discharge: 2020-11-29 | Disposition: A | Discharge status: Home / Self Care | Payer: 59 | Source: Ambulatory Visit | Attending: Family Medicine | Admitting: Family Medicine

## 2020-11-29 ENCOUNTER — Other Ambulatory Visit: Payer: Self-pay | Admitting: Diagnostic Radiology

## 2020-11-29 DIAGNOSIS — N6311 Unspecified lump in the right breast, upper outer quadrant: Secondary | ICD-10-CM | POA: Diagnosis not present

## 2020-11-29 DIAGNOSIS — R928 Other abnormal and inconclusive findings on diagnostic imaging of breast: Secondary | ICD-10-CM

## 2020-11-29 DIAGNOSIS — D241 Benign neoplasm of right breast: Secondary | ICD-10-CM | POA: Diagnosis not present

## 2020-12-02 ENCOUNTER — Other Ambulatory Visit: Payer: Self-pay

## 2020-12-02 ENCOUNTER — Encounter: Payer: Self-pay | Admitting: Emergency Medicine

## 2020-12-02 ENCOUNTER — Other Ambulatory Visit: Payer: 59

## 2020-12-02 ENCOUNTER — Emergency Department
Admission: EM | Admit: 2020-12-02 | Discharge: 2020-12-02 | Disposition: A | Discharge status: Home / Self Care | Payer: 59 | Source: Home / Self Care

## 2020-12-02 DIAGNOSIS — H9202 Otalgia, left ear: Secondary | ICD-10-CM

## 2020-12-02 DIAGNOSIS — H66002 Acute suppurative otitis media without spontaneous rupture of ear drum, left ear: Secondary | ICD-10-CM | POA: Diagnosis not present

## 2020-12-02 MED ORDER — AMOXICILLIN-POT CLAVULANATE 875-125 MG PO TABS: 1.0000 | ORAL_TABLET | Freq: Two times a day (BID) | ORAL | 0 refills | Status: AC

## 2020-12-02 NOTE — ED Provider Notes (Signed)
[facility]   [id number] 12/02/20 Arrival Time: 1025  CC: EAR PAIN  SUBJECTIVE: History from: patient.  Jazyah Butsch is a 40 y.o. female who presents with of left otalgia since yesterday. Reports nasal congestion on and off for the last week. Denies a precipitating event, such as swimming or wearing ear plugs. Patient states the pain is constant and achy in character. Patient has not taken OTC medications for this. Symptoms are made worse with lying down. Denies similar symptoms in the past. Denies fever, chills, fatigue, sinus pain, rhinorrhea, ear discharge, sore throat, SOB, wheezing, chest pain, nausea, changes in bowel or bladder habits.    ROS: As per HPI.  All other pertinent ROS negative.     Past Medical History:  Diagnosis Date  . Hyperlipemia   . Hypertension    History reviewed. No pertinent surgical history. Allergies  Allergen Reactions  . Phentermine Swelling    After 3 days pt says tongue swells and was having trouble with speech  . Buspirone Swelling  . Bupropion   . Statins Anxiety   No current facility-administered medications on file prior to encounter.   Current Outpatient Medications on File Prior to Encounter  Medication Sig Dispense Refill  . ascorbic acid (VITAMIN C) 500 MG tablet Take 1 tablet (500 mg total) by mouth daily. 30 tablet 0  . Cyanocobalamin (VITAMIN B-12 PO) Take by mouth.    . ezetimibe (ZETIA) 10 MG tablet Take 1 tablet (10 mg total) by mouth daily. (Patient not taking: Reported on 12/02/2020) 30 tablet 6  . omega-3 acid ethyl esters (LOVAZA) 1 g capsule Take by mouth 2 (two) times daily. (Patient not taking: Reported on 12/02/2020)    . Vitamin D, Ergocalciferol, (DRISDOL) 1.25 MG (50000 UNIT) CAPS capsule Take 1 capsule (50,000 Units total) by mouth every 7 (seven) days. (Patient not taking: Reported on 12/02/2020) 12 capsule 0   Social History   Socioeconomic History  . Marital status: Married    Spouse name: Not on  file  . Number of children: Not on file  . Years of education: Not on file  . Highest education level: Not on file  Occupational History  . Not on file  Tobacco Use  . Smoking status: Never Smoker  . Smokeless tobacco: Never Used  Vaping Use  . Vaping Use: Never used  Substance and Sexual Activity  . Alcohol use: Yes    Comment: socially  . Drug use: No  . Sexual activity: Yes  Other Topics Concern  . Not on file  Social History Narrative  . Not on file   Social Determinants of Health   Financial Resource Strain: Not on file  Food Insecurity: Not on file  Transportation Needs: Not on file  Physical Activity: Not on file  Stress: Not on file  Social Connections: Not on file  Intimate Partner Violence: Not on file   Family History  Problem Relation Age of Onset  . Diabetes Mother   . Hypertension Mother   . Hypothyroidism Mother   . Fibroids Mother   . Diabetes Other        father side of family  . Other Father        Counselling psychologist  . Heart disease Paternal Uncle   . Breast cancer Neg Hx     OBJECTIVE:  Vitals:   12/02/20 1042  BP: (!) 148/91  Pulse: 99  Resp: 15  Temp: 98.3 F (36.8 C)  TempSrc: Oral  SpO2:  97%     General appearance: alert; appears fatigued HEENT: Ears: EACs clear, R TM pearly gray with visible cone of light, without erythema, L TM erythematous, bulging, with effusion; Eyes: PERRL, EOMI grossly; Sinuses nontender to palpation; Nose: clear rhinorrhea; Throat: oropharynx mildly erythematous, tonsils 1+ without white tonsillar exudates, uvula midline Neck: supple without LAD Lungs: unlabored respirations, symmetrical air entry; cough: absent; no respiratory distress Heart: regular rate and rhythm.  Radial pulses 2+ symmetrical bilaterally Skin: warm and dry Psychological: alert and cooperative; normal mood and affect  Imaging: No results found.   ASSESSMENT & PLAN:  1. Non-recurrent acute suppurative otitis media of left ear  without spontaneous rupture of tympanic membrane   2. Otalgia of left ear     Meds ordered this encounter  Medications  . amoxicillin-clavulanate (AUGMENTIN) 875-125 MG tablet    Sig: Take 1 tablet by mouth 2 (two) times daily for 7 days.    Dispense:  14 tablet    Refill:  0    Order Specific Question:   Supervising Provider    Answer:   Chase Picket [[id number]]    Rest and drink plenty of fluids Prescribed augmentin 875 BID for 7 days Take medications as directed and to completion Continue to use OTC ibuprofen and/ or tylenol as needed for pain control Follow up with PCP if symptoms persists Return here or go to the ER if you have any new or worsening symptoms   Reviewed expectations re: course of current medical issues. Questions answered. Outlined signs and symptoms indicating need for more acute intervention. Patient verbalized understanding. After Visit Summary given.         Faustino Congress, NP 12/02/20 1100

## 2020-12-02 NOTE — ED Triage Notes (Signed)
Left ear pain - started this am w/ some congestion  Increased sneezing  yesterday  No OTC meds Denies fever or sore throat  COVID vaccine in April

## 2020-12-02 NOTE — Discharge Instructions (Addendum)
I have sent in Augmentin for you to take twice a day for 7 days.  Follow up with this office or with primary care if symptoms are persisting.  Follow up in the ER for high fever, trouble swallowing, trouble breathing, other concerning symptoms.  

## 2020-12-03 ENCOUNTER — Encounter: Payer: Self-pay | Admitting: Family Medicine

## 2020-12-11 ENCOUNTER — Encounter: Payer: 59 | Admitting: Family Medicine

## 2021-01-21 ENCOUNTER — Ambulatory Visit (INDEPENDENT_AMBULATORY_CARE_PROVIDER_SITE_OTHER): Payer: 59 | Admitting: Family Medicine

## 2021-01-21 ENCOUNTER — Encounter: Payer: Self-pay | Admitting: Family Medicine

## 2021-01-21 ENCOUNTER — Other Ambulatory Visit: Payer: Self-pay

## 2021-01-21 VITALS — BP 130/80 | HR 76 | Temp 98.4°F | Resp 18 | Ht 62.0 in | Wt 194.0 lb

## 2021-01-21 DIAGNOSIS — E559 Vitamin D deficiency, unspecified: Secondary | ICD-10-CM

## 2021-01-21 DIAGNOSIS — E538 Deficiency of other specified B group vitamins: Secondary | ICD-10-CM

## 2021-01-21 DIAGNOSIS — Z0000 Encounter for general adult medical examination without abnormal findings: Secondary | ICD-10-CM

## 2021-01-21 DIAGNOSIS — E669 Obesity, unspecified: Secondary | ICD-10-CM

## 2021-01-21 LAB — CBC WITH DIFFERENTIAL/PLATELET
Basophils Absolute: 0 10*3/uL (ref 0.0–0.1)
Basophils Relative: 0.5 % (ref 0.0–3.0)
Eosinophils Absolute: 0.2 10*3/uL (ref 0.0–0.7)
Eosinophils Relative: 2.9 % (ref 0.0–5.0)
HCT: 36.9 % (ref 36.0–46.0)
Hemoglobin: 12.4 g/dL (ref 12.0–15.0)
Lymphocytes Relative: 30.6 % (ref 12.0–46.0)
Lymphs Abs: 1.8 10*3/uL (ref 0.7–4.0)
MCHC: 33.6 g/dL (ref 30.0–36.0)
MCV: 87.7 fl (ref 78.0–100.0)
Monocytes Absolute: 0.4 10*3/uL (ref 0.1–1.0)
Monocytes Relative: 6 % (ref 3.0–12.0)
Neutro Abs: 3.6 10*3/uL (ref 1.4–7.7)
Neutrophils Relative %: 60 % (ref 43.0–77.0)
Platelets: 309 10*3/uL (ref 150.0–400.0)
RBC: 4.21 Mil/uL (ref 3.87–5.11)
RDW: 14.1 % (ref 11.5–15.5)
WBC: 5.9 10*3/uL (ref 4.0–10.5)

## 2021-01-21 LAB — LIPID PANEL
Cholesterol: 278 mg/dL — ABNORMAL HIGH (ref 0–200)
HDL: 52.9 mg/dL (ref >39.00)
LDL Cholesterol: 199 mg/dL — ABNORMAL HIGH (ref 0–99)
NonHDL: 225.57
Total CHOL/HDL Ratio: 5
Triglycerides: 132 mg/dL (ref 0.0–149.0)
VLDL: 26.4 mg/dL (ref 0.0–40.0)

## 2021-01-21 LAB — BASIC METABOLIC PANEL
BUN: 10 mg/dL (ref 6–23)
CO2: 25 mEq/L (ref 19–32)
Calcium: 9.2 mg/dL (ref 8.4–10.5)
Chloride: 105 mEq/L (ref 96–112)
Creatinine, Ser: 0.63 mg/dL (ref 0.40–1.20)
GFR: 109.94 mL/min (ref >60.00)
Glucose, Bld: 100 mg/dL — ABNORMAL HIGH (ref 70–99)
Potassium: 3.4 mEq/L — ABNORMAL LOW (ref 3.5–5.1)
Sodium: 139 mEq/L (ref 135–145)

## 2021-01-21 LAB — HEPATIC FUNCTION PANEL
ALT: 13 U/L (ref 0–35)
AST: 17 U/L (ref 0–37)
Albumin: 4.2 g/dL (ref 3.5–5.2)
Alkaline Phosphatase: 66 U/L (ref 39–117)
Bilirubin, Direct: 0.1 mg/dL (ref 0.0–0.3)
Total Bilirubin: 0.4 mg/dL (ref 0.2–1.2)
Total Protein: 7 g/dL (ref 6.0–8.3)

## 2021-01-21 LAB — VITAMIN D 25 HYDROXY (VIT D DEFICIENCY, FRACTURES): VITD: 10.54 ng/mL — ABNORMAL LOW (ref 30.00–100.00)

## 2021-01-21 LAB — TSH: TSH: 2.66 u[IU]/mL (ref 0.35–4.50)

## 2021-01-21 LAB — VITAMIN B12: Vitamin B-12: 796 pg/mL (ref 211–911)

## 2021-01-21 NOTE — Patient Instructions (Addendum)
Follow up in 6 months to recheck cholesterol We'll notify you of your lab results and make any changes if needed Continue to work on healthy diet and regular exercise- you can do it!!! Look for the skin tag treatment on [employer] with any questions or concerns Hang in there!  You can do this!!

## 2021-01-21 NOTE — Assessment & Plan Note (Signed)
Ongoing issue for pt. BMI is 35.48  High levels of stress at home.  Encouraged healthy diet and regular exercise.  Check labs to risk stratify.  Will follow.

## 2021-01-21 NOTE — Assessment & Plan Note (Signed)
Check labs and replete prn. 

## 2021-01-21 NOTE — Progress Notes (Signed)
   Subjective:    Patient ID: Rose Bell, female    DOB: 1981/07/24, 40 y.o.   MRN: [id number]  HPI CPE- UTD on pap, mammo, Tdap, COVID  Reviewed past medical, surgical, family and social histories.   Health Maintenance  Topic Date Due  . Hepatitis C Screening  Never done  . COVID-19 Vaccine (3 - Booster for [place] series) 06/23/2021 (Originally 07/20/2020)  . INFLUENZA VACCINE  05/19/2021  . PAP SMEAR-Modifier  12/05/2022  . TETANUS/TDAP  01/23/2024  . HIV Screening  Completed  . HPV VACCINES  Aged Out      Review of Systems Patient reports no vision/ hearing changes, adenopathy,fever, weight change,  persistant/recurrent hoarseness , swallowing issues, chest pain, palpitations, edema, persistant/recurrent cough, hemoptysis, dyspnea (rest/exertional/paroxysmal nocturnal), gastrointestinal bleeding (melena, rectal bleeding), abdominal pain, significant heartburn, bowel changes, GU symptoms (dysuria, hematuria, incontinence), Gyn symptoms (abnormal  bleeding, pain),  syncope, focal weakness, memory loss, numbness & tingling, hair/nail changes, abnormal bruising or bleeding, anxiety, or depression.   + skin lesion on back- has sloughed off x2, has bled previously  This visit occurred during the SARS-CoV-2 public health emergency.  Safety protocols were in place, including screening questions prior to the visit, additional usage of staff PPE, and extensive cleaning of exam room while observing appropriate contact time as indicated for disinfecting solutions.       Objective:   Physical Exam General Appearance:    Alert, cooperative, no distress, appears stated age, obese  Head:    Normocephalic, without obvious abnormality, atraumatic  Eyes:    PERRL, conjunctiva/corneas clear, EOM's intact, fundi    benign, both eyes  Ears:    Normal TM's and external ear canals, both ears  Nose:   Deferred due to COVID  Throat:   Neck:   Supple, symmetrical, trachea midline, no adenopathy;     Thyroid: no enlargement/tenderness/nodules  Back:     Symmetric, no curvature, ROM normal, no CVA tenderness  Lungs:     Clear to auscultation bilaterally, respirations unlabored  Chest Wall:    No tenderness or deformity   Heart:    Regular rate and rhythm, S1 and S2 normal, no murmur, rub   or gallop  Breast Exam:    Deferred to mammo  Abdomen:     Soft, non-tender, bowel sounds active all four quadrants,    no masses, no organomegaly  Genitalia:    Deferred  Rectal:    Extremities:   Extremities normal, atraumatic, no cyanosis or edema  Pulses:   2+ and symmetric all extremities  Skin:   Skin color, texture, turgor normal, no rashes or lesions  Lymph nodes:   Cervical, supraclavicular, and axillary nodes normal  Neurologic:   CNII-XII intact, normal strength, sensation and reflexes    throughout          Assessment & Plan:

## 2021-01-21 NOTE — Assessment & Plan Note (Signed)
Pt has hx of this.  Check labs and replete prn. 

## 2021-01-21 NOTE — Assessment & Plan Note (Signed)
Pt's PE WNL w/ exception of obesity.  UTD on pap, mammo, immunizations.  Check labs.  Anticipatory guidance provided.  

## 2021-01-22 ENCOUNTER — Other Ambulatory Visit (HOSPITAL_COMMUNITY): Payer: Self-pay

## 2021-01-22 ENCOUNTER — Encounter: Payer: Self-pay | Admitting: Family Medicine

## 2021-01-22 ENCOUNTER — Other Ambulatory Visit: Payer: Self-pay

## 2021-01-22 DIAGNOSIS — E782 Mixed hyperlipidemia: Secondary | ICD-10-CM

## 2021-01-22 DIAGNOSIS — E559 Vitamin D deficiency, unspecified: Secondary | ICD-10-CM

## 2021-01-22 MED ORDER — ROSUVASTATIN CALCIUM 10 MG PO TABS: 10.0000 mg | ORAL_TABLET | Freq: Every day | ORAL | 3 refills | Status: DC

## 2021-01-22 MED ORDER — VITAMIN D (ERGOCALCIFEROL) 1.25 MG (50000 UNIT) PO CAPS
50000.0000 [IU] | ORAL_CAPSULE | ORAL | 0 refills | Status: DC
  Filled 2021-01-22: qty 12, 84d supply, fill #0

## 2021-01-22 MED ORDER — VITAMIN D (ERGOCALCIFEROL) 1.25 MG (50000 UNIT) PO CAPS: 50000.0000 [IU] | ORAL_CAPSULE | ORAL | 0 refills | Status: DC

## 2021-01-22 MED ORDER — ROSUVASTATIN CALCIUM 10 MG PO TABS
10.0000 mg | ORAL_TABLET | Freq: Every day | ORAL | 3 refills | Status: DC
  Filled 2021-01-22: qty 30, 30d supply, fill #0
  Filled 2021-09-09: qty 90, 90d supply, fill #0
  Filled 2021-12-09: qty 30, 30d supply, fill #1

## 2021-01-22 NOTE — Telephone Encounter (Signed)
Patient was seen yesterday in office and would like to try the medications you suggested yesterday.

## 2021-02-26 ENCOUNTER — Other Ambulatory Visit: Payer: Self-pay

## 2021-02-26 ENCOUNTER — Encounter: Payer: Self-pay | Admitting: Family Medicine

## 2021-02-26 ENCOUNTER — Other Ambulatory Visit (HOSPITAL_COMMUNITY): Payer: Self-pay

## 2021-02-26 MED ORDER — ROSUVASTATIN CALCIUM 10 MG PO TABS
10.0000 mg | ORAL_TABLET | Freq: Every day | ORAL | 1 refills | Status: DC
  Filled 2021-02-26: qty 90, 90d supply, fill #0
  Filled 2021-05-27: qty 90, 90d supply, fill #1

## 2021-04-16 ENCOUNTER — Encounter: Payer: Self-pay | Admitting: *Deleted

## 2021-05-27 ENCOUNTER — Other Ambulatory Visit (HOSPITAL_COMMUNITY): Payer: Self-pay

## 2021-07-21 ENCOUNTER — Encounter: Payer: Self-pay | Admitting: Family Medicine

## 2021-07-21 ENCOUNTER — Other Ambulatory Visit: Payer: Self-pay

## 2021-07-21 ENCOUNTER — Ambulatory Visit: Payer: 59 | Admitting: Family Medicine

## 2021-07-21 VITALS — BP 122/82 | HR 88 | Temp 98.6°F | Resp 16 | Wt 200.2 lb

## 2021-07-21 DIAGNOSIS — E782 Mixed hyperlipidemia: Secondary | ICD-10-CM | POA: Diagnosis not present

## 2021-07-21 DIAGNOSIS — E669 Obesity, unspecified: Secondary | ICD-10-CM | POA: Diagnosis not present

## 2021-07-21 DIAGNOSIS — Z23 Encounter for immunization: Secondary | ICD-10-CM

## 2021-07-21 LAB — CBC WITH DIFFERENTIAL/PLATELET
Basophils Absolute: 0 10*3/uL (ref 0.0–0.1)
Basophils Relative: 0.5 % (ref 0.0–3.0)
Eosinophils Absolute: 0.3 10*3/uL (ref 0.0–0.7)
Eosinophils Relative: 3.7 % (ref 0.0–5.0)
HCT: 38.9 % (ref 36.0–46.0)
Hemoglobin: 13 g/dL (ref 12.0–15.0)
Lymphocytes Relative: 35.3 % (ref 12.0–46.0)
Lymphs Abs: 2.7 10*3/uL (ref 0.7–4.0)
MCHC: 33.5 g/dL (ref 30.0–36.0)
MCV: 87.9 fl (ref 78.0–100.0)
Monocytes Absolute: 0.4 10*3/uL (ref 0.1–1.0)
Monocytes Relative: 5.4 % (ref 3.0–12.0)
Neutro Abs: 4.3 10*3/uL (ref 1.4–7.7)
Neutrophils Relative %: 55.1 % (ref 43.0–77.0)
Platelets: 268 10*3/uL (ref 150.0–400.0)
RBC: 4.43 Mil/uL (ref 3.87–5.11)
RDW: 13.2 % (ref 11.5–15.5)
WBC: 7.8 10*3/uL (ref 4.0–10.5)

## 2021-07-21 LAB — TSH: TSH: 2.82 u[IU]/mL (ref 0.35–5.50)

## 2021-07-21 LAB — BASIC METABOLIC PANEL
BUN: 10 mg/dL (ref 6–23)
CO2: 26 mEq/L (ref 19–32)
Calcium: 9.1 mg/dL (ref 8.4–10.5)
Chloride: 103 mEq/L (ref 96–112)
Creatinine, Ser: 0.65 mg/dL (ref 0.40–1.20)
GFR: 108.73 mL/min (ref >60.00)
Glucose, Bld: 86 mg/dL (ref 70–99)
Potassium: 4 mEq/L (ref 3.5–5.1)
Sodium: 139 mEq/L (ref 135–145)

## 2021-07-21 LAB — HEPATIC FUNCTION PANEL
ALT: 15 U/L (ref 0–35)
AST: 15 U/L (ref 0–37)
Albumin: 4.4 g/dL (ref 3.5–5.2)
Alkaline Phosphatase: 64 U/L (ref 39–117)
Bilirubin, Direct: 0.1 mg/dL (ref 0.0–0.3)
Total Bilirubin: 0.4 mg/dL (ref 0.2–1.2)
Total Protein: 6.5 g/dL (ref 6.0–8.3)

## 2021-07-21 LAB — LIPID PANEL
Cholesterol: 196 mg/dL (ref 0–200)
HDL: 58.7 mg/dL (ref >39.00)
LDL Cholesterol: 117 mg/dL — ABNORMAL HIGH (ref 0–99)
NonHDL: 137.63
Total CHOL/HDL Ratio: 3
Triglycerides: 104 mg/dL (ref 0.0–149.0)
VLDL: 20.8 mg/dL (ref 0.0–40.0)

## 2021-07-21 NOTE — Assessment & Plan Note (Signed)
Chronic problem.  On Crestor 10mg  daily w/o difficulty.  She plans to get back on track w/ diet and exercise.  Check labs.  Adjust meds prn

## 2021-07-21 NOTE — Patient Instructions (Signed)
Schedule your complete physical in 6 months We'll notify you of your lab results and make any changes if needed You can do the diet and exercise thing!  You know you can! Call with any questions or concerns Stay Safe!  Stay Healthy! Happy Fall!!!

## 2021-07-21 NOTE — Progress Notes (Signed)
   Subjective:    Patient ID: Rose Bell, female    DOB: 1981/01/13, 40 y.o.   MRN: [id number]  HPI Hyperlipidemia- chronic problem, on Crestor 10mg  daily.  No CP, SOB, abd pain, N/V.  Obesity- pt has gained 6 lbs since last visit.  BMI now 36.62  no regular exercise.  Pt states she 'had a long conversation w/ myself this weekend'.  Wants to get back on track.  Is going to join the gym again.  'i gotta do it for me'.   Review of Systems For ROS see HPI   This visit occurred during the SARS-CoV-2 public health emergency.  Safety protocols were in place, including screening questions prior to the visit, additional usage of staff PPE, and extensive cleaning of exam room while observing appropriate contact time as indicated for disinfecting solutions.      Objective:   Physical Exam Vitals reviewed.  Constitutional:      General: She is not in acute distress.    Appearance: Normal appearance. She is well-developed. She is obese.  HENT:     Head: Normocephalic and atraumatic.  Eyes:     Conjunctiva/sclera: Conjunctivae normal.     Pupils: Pupils are equal, round, and reactive to light.  Neck:     Thyroid: No thyromegaly.  Cardiovascular:     Rate and Rhythm: Normal rate and regular rhythm.     Pulses: Normal pulses.     Heart sounds: Normal heart sounds. No murmur heard. Pulmonary:     Effort: Pulmonary effort is normal. No respiratory distress.     Breath sounds: Normal breath sounds.  Abdominal:     General: There is no distension.     Palpations: Abdomen is soft.     Tenderness: There is no abdominal tenderness.  Musculoskeletal:     Cervical back: Normal range of motion and neck supple.     Right lower leg: No edema.     Left lower leg: No edema.  Lymphadenopathy:     Cervical: No cervical adenopathy.  Skin:    General: Skin is warm and dry.  Neurological:     Mental Status: She is alert and oriented to person, place, and time.  Psychiatric:        Behavior:  Behavior normal.          Assessment & Plan:

## 2021-07-21 NOTE — Assessment & Plan Note (Signed)
Deteriorated.  She has gained 6 lbs since last visit.  BMI now 36.62  Reports no regular exercise but her plan is to rejoin the gym this week and get back on track.  Applauded her decision to focus on herself.  Will follow.

## 2021-07-30 ENCOUNTER — Emergency Department (INDEPENDENT_AMBULATORY_CARE_PROVIDER_SITE_OTHER): Payer: 59

## 2021-07-30 ENCOUNTER — Encounter: Payer: Self-pay | Admitting: Emergency Medicine

## 2021-07-30 ENCOUNTER — Other Ambulatory Visit: Payer: Self-pay

## 2021-07-30 ENCOUNTER — Emergency Department
Admission: EM | Admit: 2021-07-30 | Discharge: 2021-07-30 | Disposition: A | Discharge status: Home / Self Care | Payer: 59 | Source: Home / Self Care

## 2021-07-30 DIAGNOSIS — W19XXXA Unspecified fall, initial encounter: Secondary | ICD-10-CM

## 2021-07-30 DIAGNOSIS — M79671 Pain in right foot: Secondary | ICD-10-CM | POA: Diagnosis not present

## 2021-07-30 DIAGNOSIS — S93601A Unspecified sprain of right foot, initial encounter: Secondary | ICD-10-CM

## 2021-07-30 DIAGNOSIS — M7989 Other specified soft tissue disorders: Secondary | ICD-10-CM | POA: Diagnosis not present

## 2021-07-30 NOTE — ED Triage Notes (Addendum)
Pt twisted right foot yesterday -pain today to top of R foot  No OTC meds  Iced yesterday  Tiger balm on lats night  Pt was at work today - wore heels today, noticed pain is worse w/ flexing foot  Swelling to right foot noted

## 2021-07-30 NOTE — Discharge Instructions (Addendum)
Advised patient to RICE right foot for 25 minutes 2-3 times daily for the next 3 days.  Advised may take OTC ibuprofen 600 to 800 mg 1-2 times daily, as needed for the next 7 to 10 days.

## 2021-07-30 NOTE — ED Provider Notes (Signed)
[facility]    CSN: [id number] Arrival date & time: 07/30/21  1711      History   Chief Complaint Chief Complaint  Patient presents with  . Foot Pain    right    HPI Rose Bell is a 40 y.o. female.   HPI 40 year old female presents with right foot pain for 1 day, reports wearing heels today with foot pain worsening.  Past Medical History:  Diagnosis Date  . Hyperlipemia   . Hypertension     Patient Active Problem List   Diagnosis Date Noted  . Pneumonia due to COVID-19 virus 10/26/2019  . Fracture of finger, middle phalanx, left, closed 09/26/2019  . Orgasmic headache 01/08/2016  . Obesity (BMI 30-39.9) 01/02/2015  . Hyperlipidemia 08/10/2014  . GERD (gastroesophageal reflux disease) 03/04/2012  . General medical examination 06/26/2011  . Screening for malignant neoplasm of the cervix 06/26/2011  . Vitamin D deficiency 05/30/2009  . Anxiety state 05/30/2009  . B12 deficiency 05/23/2009    History reviewed. No pertinent surgical history.  OB History   No obstetric history on file.      Home Medications    Prior to Admission medications   Medication Sig Start Date End Date Taking? Authorizing Provider  Cholecalciferol (VITAMIN D3) 50 MCG (2000 UT) CAPS Take by mouth daily at 2 PM.   Yes [provider]  ascorbic acid (VITAMIN C) 500 MG tablet Take 1 tablet (500 mg total) by mouth daily. 10/29/19   Cristal Deer, MD  Biotin 1000 MCG tablet Take 1,000 mcg by mouth 3 (three) times daily.    [provider]  Cetirizine HCl (ALLERGY, CETIRIZINE, PO) Take by mouth.    [provider]  Cyanocobalamin (VITAMIN B-12 PO) Take by mouth.    [provider]  rosuvastatin (CRESTOR) 10 MG tablet Take 1 tablet (10 mg total) by mouth daily. 01/22/21   Midge Minium, MD  Vitamin D, Ergocalciferol, (DRISDOL) 1.25 MG (50000 UNIT) CAPS capsule Take 1 capsule (50,000 Units total) by mouth every 7 (seven)  days. Patient not taking: Reported on 07/30/2021 01/22/21   Midge Minium, MD    Family History Family History  Problem Relation Age of Onset  . Diabetes Mother   . Hypertension Mother   . Hypothyroidism Mother   . Fibroids Mother   . Diabetes Other        father side of family  . Other Father        Counselling psychologist  . Heart disease Paternal Uncle   . Breast cancer Neg Hx     Social History Social History   Tobacco Use  . Smoking status: Never  . Smokeless tobacco: Never  Vaping Use  . Vaping Use: Never used  Substance Use Topics  . Alcohol use: Yes    Comment: socially  . Drug use: No     Allergies   Phentermine, Buspirone, Bupropion, and Statins   Review of Systems Review of Systems  Musculoskeletal:        Right foot pain x1 day    Physical Exam Triage Vital Signs ED Triage Vitals  Enc Vitals Group     BP 07/30/21 1802 (!) 145/89     Pulse Rate 07/30/21 1802 85     Resp 07/30/21 1802 15     Temp 07/30/21 1802 98.9 F (37.2 C)     Temp Source 07/30/21 1802 Oral     SpO2 07/30/21 1802 100 %  Weight 07/30/21 1803 200 lb 3.2 oz (90.8 kg)     Height --      Head Circumference --      Peak Flow --      Pain Score 07/30/21 1802 3     Pain Loc --      Pain Edu? --      Excl. in [place]? --    No data found.  Updated Vital Signs BP (!) 145/89 (BP Location: Right Arm)   Pulse 85   Temp 98.9 F (37.2 C) (Oral)   Resp 15   Wt 200 lb 3.2 oz (90.8 kg)   LMP 07/06/2021   SpO2 100%   BMI 36.62 kg/m   Physical Exam Vitals and nursing note reviewed.  Constitutional:      General: She is not in acute distress.    Appearance: Normal appearance. She is obese. She is not ill-appearing.  HENT:     Head: Normocephalic and atraumatic.     Nose: Nose normal.     Mouth/Throat:     Mouth: Mucous membranes are moist.     Pharynx: Oropharynx is clear.  Eyes:     Extraocular Movements: Extraocular movements intact.     Conjunctiva/sclera: Conjunctivae  normal.     Pupils: Pupils are equal, round, and reactive to light.  Cardiovascular:     Rate and Rhythm: Normal rate and regular rhythm.     Pulses: Normal pulses.     Heart sounds: Normal heart sounds.  Pulmonary:     Effort: Pulmonary effort is normal.     Breath sounds: No wheezing, rhonchi or rales.  Musculoskeletal:        General: Swelling and tenderness present.     Cervical back: Normal range of motion and neck supple.     Comments: Right ankle/foot: TTP over lateral malleolus, fourth/fifth metatarsal heads, moderate soft tissue swelling noted  Skin:    General: Skin is warm and dry.  Neurological:     General: No focal deficit present.     Mental Status: She is alert and oriented to person, place, and time. Mental status is at baseline.  Psychiatric:        Mood and Affect: Mood normal.        Behavior: Behavior normal.        Thought Content: Thought content normal.     UC Treatments / Results  Labs (all labs ordered are listed, but only abnormal results are displayed) Labs Reviewed - No data to display  EKG   Radiology DG Foot Complete Right  Result Date: 07/30/2021 CLINICAL DATA:  Twisted right foot, fall EXAM: RIGHT FOOT COMPLETE - 3+ VIEW COMPARISON:  None. FINDINGS: There is no evidence of fracture or dislocation. There is no evidence of arthropathy or other focal bone abnormality. Mild soft tissue swelling. No foreign body. IMPRESSION: Negative. Electronically Signed   By: Merilyn Baba M.D.   On: 07/30/2021 18:48    Procedures Procedures (including critical care time)  Medications Ordered in UC Medications - No data to display  Initial Impression / Assessment and Plan / UC Course  I have reviewed the triage vital signs and the nursing notes.  Pertinent labs & imaging results that were available during my care of the patient were reviewed by me and considered in my medical decision making (see chart for details).     MDM: 1.  Foot pain, right-right  foot x-ray revealed above-negative for acute osseous process.  Advised patient to  RICE right foot for 25 minutes 2-3 times daily for the next 3 days.  Advised may take OTC ibuprofen 600 to 800 mg 1-2 times daily, as needed for the next 7 to 10 days. Final Clinical Impressions(s) / UC Diagnoses   Final diagnoses:  Foot pain, right     Discharge Instructions      Advised patient to RICE right foot for 25 minutes 2-3 times daily for the next 3 days.  Advised may take OTC ibuprofen 600 to 800 mg 1-2 times daily, as needed for the next 7 to 10 days.     ED Prescriptions   None    PDMP not reviewed this encounter.   Eliezer Lofts, [place] 07/30/21 1907

## 2021-08-18 ENCOUNTER — Other Ambulatory Visit: Payer: Self-pay

## 2021-08-18 ENCOUNTER — Ambulatory Visit: Payer: 59 | Admitting: Registered Nurse

## 2021-08-18 ENCOUNTER — Encounter: Payer: Self-pay | Admitting: Registered Nurse

## 2021-08-18 ENCOUNTER — Other Ambulatory Visit (HOSPITAL_COMMUNITY)
Admission: RE | Admit: 2021-08-18 | Discharge: 2021-08-18 | Disposition: A | Discharge status: Home / Self Care | Payer: 59 | Source: Ambulatory Visit | Attending: Registered Nurse | Admitting: Registered Nurse

## 2021-08-18 ENCOUNTER — Other Ambulatory Visit (INDEPENDENT_AMBULATORY_CARE_PROVIDER_SITE_OTHER): Payer: 59

## 2021-08-18 VITALS — BP 158/80 | HR 79 | Temp 98.3°F | Resp 18 | Ht 62.0 in | Wt 199.0 lb

## 2021-08-18 DIAGNOSIS — R1011 Right upper quadrant pain: Secondary | ICD-10-CM | POA: Insufficient documentation

## 2021-08-18 DIAGNOSIS — R109 Unspecified abdominal pain: Secondary | ICD-10-CM | POA: Insufficient documentation

## 2021-08-18 DIAGNOSIS — Z113 Encounter for screening for infections with a predominantly sexual mode of transmission: Secondary | ICD-10-CM

## 2021-08-18 DIAGNOSIS — R1031 Right lower quadrant pain: Secondary | ICD-10-CM

## 2021-08-18 DIAGNOSIS — R4582 Worries: Secondary | ICD-10-CM

## 2021-08-18 LAB — COMPREHENSIVE METABOLIC PANEL
ALT: 14 U/L (ref 0–35)
AST: 15 U/L (ref 0–37)
Albumin: 4.5 g/dL (ref 3.5–5.2)
Alkaline Phosphatase: 62 U/L (ref 39–117)
BUN: 10 mg/dL (ref 6–23)
CO2: 28 mEq/L (ref 19–32)
Calcium: 9.4 mg/dL (ref 8.4–10.5)
Chloride: 103 mEq/L (ref 96–112)
Creatinine, Ser: 0.66 mg/dL (ref 0.40–1.20)
GFR: 108.28 mL/min (ref >60.00)
Glucose, Bld: 105 mg/dL — ABNORMAL HIGH (ref 70–99)
Potassium: 3.6 mEq/L (ref 3.5–5.1)
Sodium: 140 mEq/L (ref 135–145)
Total Bilirubin: 0.3 mg/dL (ref 0.2–1.2)
Total Protein: 7 g/dL (ref 6.0–8.3)

## 2021-08-18 LAB — CBC WITH DIFFERENTIAL/PLATELET
Basophils Absolute: 0.1 10*3/uL (ref 0.0–0.1)
Basophils Relative: 0.8 % (ref 0.0–3.0)
Eosinophils Absolute: 0.3 10*3/uL (ref 0.0–0.7)
Eosinophils Relative: 3.9 % (ref 0.0–5.0)
HCT: 39.2 % (ref 36.0–46.0)
Hemoglobin: 12.9 g/dL (ref 12.0–15.0)
Lymphocytes Relative: 39.9 % (ref 12.0–46.0)
Lymphs Abs: 3.4 10*3/uL (ref 0.7–4.0)
MCHC: 32.9 g/dL (ref 30.0–36.0)
MCV: 87.9 fl (ref 78.0–100.0)
Monocytes Absolute: 0.5 10*3/uL (ref 0.1–1.0)
Monocytes Relative: 6 % (ref 3.0–12.0)
Neutro Abs: 4.2 10*3/uL (ref 1.4–7.7)
Neutrophils Relative %: 49.4 % (ref 43.0–77.0)
Platelets: 299 10*3/uL (ref 150.0–400.0)
RBC: 4.46 Mil/uL (ref 3.87–5.11)
RDW: 13.4 % (ref 11.5–15.5)
WBC: 8.5 10*3/uL (ref 4.0–10.5)

## 2021-08-18 LAB — POCT URINALYSIS DIP (MANUAL ENTRY)
Bilirubin, UA: NEGATIVE
Blood, UA: NEGATIVE
Glucose, UA: NEGATIVE mg/dL
Ketones, POC UA: NEGATIVE mg/dL
Nitrite, UA: NEGATIVE
Spec Grav, UA: 1.01 (ref 1.010–1.025)
Urobilinogen, UA: 0.2 E.U./dL
pH, UA: 5 (ref 5.0–8.0)

## 2021-08-18 LAB — TSH: TSH: 1.89 u[IU]/mL (ref 0.35–5.50)

## 2021-08-18 MED ORDER — TRAMADOL HCL 50 MG PO TABS: 50.0000 mg | ORAL_TABLET | Freq: Three times a day (TID) | ORAL | 0 refills | Status: AC | PRN

## 2021-08-18 NOTE — Patient Instructions (Signed)
Ms. Rose Bell to meet you  Let me know if:  Pain gets worse Vaginal bleeding starts again You develop any vaginal symptoms or urinary symptoms. Fevers, chills, fatigue Worsening nausea or nausea with vomiting.  Take tramadol as instructed as needed  I'll be in touch with results  Thanks,  Rich

## 2021-08-18 NOTE — Progress Notes (Signed)
Established Patient Office Visit  Subjective:  Patient ID: Rose Bell, female    DOB: [birth year]  Age: 40 y.o. MRN: [id number]  CC:  Chief Complaint  Patient presents with   Pain    Patient states she has been having some right side pain for over a week. Patient states the pain comes and goes.    HPI Rose Bell presents for flank pain  Onset around 1 week ago, mostly front and wrapping around side.  On the 21st of this month - started cycle - achy pelvis. Different than typical period pain.  Within a few days generalized pelvic pain resolved, but ongoing R side and flank Constant for one week. Pt describes as "humming pain".  At times woke her up at night with sharp, severe pain.  Last week had dizziness, nausea, sensitive to smells.  Wednesday this continued - she called her Gyn on Thursday and unfortunately could not get in to be seen. Over this past weekend, pain moved, radiated downwards. Intermittent with quick bursts of pain.   Nausea not ongoing at this time Some mild lightheadedness.   Had taken ibuprofen for relief, limited effect. Pamprin during menses with limited relief.   Notes recent hx of irregular menses - bleeding has been varied, cramping been varied, but this was a very bad menstrual cycle.   Tubal ligation around 20 years ago.  Past Medical History:  Diagnosis Date   Hyperlipemia    Hypertension     History reviewed. No pertinent surgical history.  Family History  Problem Relation Age of Onset   Diabetes Mother    Hypertension Mother    Hypothyroidism Mother    Fibroids Mother    Diabetes Other        father side of family   Other Father        Cardio Megaly   Heart disease Paternal Uncle    Breast cancer Neg Hx     Social History   Socioeconomic History   Marital status: Married    Spouse name: Not on file   Number of children: Not on file   Years of education: Not on file   Highest education level: Not on file   Occupational History   Not on file  Tobacco Use   Smoking status: Never   Smokeless tobacco: Never  Vaping Use   Vaping Use: Never used  Substance and Sexual Activity   Alcohol use: Yes    Comment: socially   Drug use: No   Sexual activity: Yes  Other Topics Concern   Not on file  Social History Narrative   Not on file   Social Determinants of Health   Financial Resource Strain: Not on file  Food Insecurity: Not on file  Transportation Needs: Not on file  Physical Activity: Not on file  Stress: Not on file  Social Connections: Not on file  Intimate Partner Violence: Not on file    Outpatient Medications Prior to Visit  Medication Sig Dispense Refill   ascorbic acid (VITAMIN C) 500 MG tablet Take 1 tablet (500 mg total) by mouth daily. 30 tablet 0   Biotin 1000 MCG tablet Take 1,000 mcg by mouth 3 (three) times daily.     Cetirizine HCl (ALLERGY, CETIRIZINE, PO) Take by mouth.     Cholecalciferol (VITAMIN D3) 50 MCG (2000 UT) CAPS Take by mouth daily at 2 PM.     Cyanocobalamin (VITAMIN B-12 PO) Take by mouth.     rosuvastatin (  CRESTOR) 10 MG tablet Take 1 tablet (10 mg total) by mouth daily. 30 tablet 3   Vitamin D, Ergocalciferol, (DRISDOL) 1.25 MG (50000 UNIT) CAPS capsule Take 1 capsule (50,000 Units total) by mouth every 7 (seven) days. (Patient not taking: Reported on 08/18/2021) 12 capsule 0   No facility-administered medications prior to visit.    Allergies  Allergen Reactions   Phentermine Swelling    After 3 days pt says tongue swells and was having trouble with speech   Buspirone Swelling   Bupropion Other (See Comments)    Unknown reaction    Statins Anxiety    ROS Review of Systems  Constitutional: Negative.   HENT: Negative.    Eyes: Negative.   Respiratory: Negative.    Cardiovascular: Negative.   Gastrointestinal: Negative.   Genitourinary: Negative.   Musculoskeletal: Negative.   Skin: Negative.   Neurological: Negative.    Psychiatric/Behavioral: Negative.    All other systems reviewed and are negative.    Objective:    Physical Exam Vitals and nursing note reviewed.  Constitutional:      General: She is not in acute distress.    Appearance: Normal appearance. She is normal weight. She is not ill-appearing, toxic-appearing or diaphoretic.  Cardiovascular:     Rate and Rhythm: Normal rate and regular rhythm.     Heart sounds: Normal heart sounds. No murmur heard.   No friction rub. No gallop.  Pulmonary:     Effort: Pulmonary effort is normal. No respiratory distress.     Breath sounds: Normal breath sounds. No stridor. No wheezing, rhonchi or rales.  Chest:     Chest wall: No tenderness.  Abdominal:     General: Abdomen is flat. Bowel sounds are normal. There is no distension.     Palpations: There is no mass.     Tenderness: There is abdominal tenderness. There is no right CVA tenderness, left CVA tenderness, guarding or rebound.     Hernia: No hernia is present.  Skin:    General: Skin is warm and dry.  Neurological:     General: No focal deficit present.     Mental Status: She is alert and oriented to person, place, and time. Mental status is at baseline.  Psychiatric:        Mood and Affect: Mood normal.        Behavior: Behavior normal.        Thought Content: Thought content normal.        Judgment: Judgment normal.    BP (!) 158/80   Pulse 79   Temp 98.3 F (36.8 C) (Temporal)   Resp 18   Ht 5\' 2"  (1.575 m)   Wt 199 lb (90.3 kg)   LMP 08/16/2021   SpO2 99%   BMI 36.40 kg/m  Wt Readings from Last 3 Encounters:  08/18/21 199 lb (90.3 kg)  07/30/21 200 lb 3.2 oz (90.8 kg)  07/21/21 200 lb 3.2 oz (90.8 kg)     Health Maintenance Due  Topic Date Due   Pneumococcal Vaccine 15-25 Years old (1 - PCV) Never done   COVID-19 Vaccine (3 - Booster for Pfizer series) 03/15/2020    There are no preventive care reminders to display for this patient.  Lab Results  Component Value  Date   TSH 2.82 07/21/2021   Lab Results  Component Value Date   WBC 7.8 07/21/2021   HGB 13.0 07/21/2021   HCT 38.9 07/21/2021   MCV 87.9 07/21/2021  PLT 268.0 07/21/2021   Lab Results  Component Value Date   NA 139 07/21/2021   K 4.0 07/21/2021   CO2 26 07/21/2021   GLUCOSE 86 07/21/2021   BUN 10 07/21/2021   CREATININE 0.65 07/21/2021   BILITOT 0.4 07/21/2021   ALKPHOS 64 07/21/2021   AST 15 07/21/2021   ALT 15 07/21/2021   PROT 6.5 07/21/2021   ALBUMIN 4.4 07/21/2021   CALCIUM 9.1 07/21/2021   ANIONGAP 10 10/26/2019   GFR 108.73 07/21/2021   Lab Results  Component Value Date   CHOL 196 07/21/2021   Lab Results  Component Value Date   HDL 58.70 07/21/2021   Lab Results  Component Value Date   LDLCALC 117 (H) 07/21/2021   Lab Results  Component Value Date   TRIG 104.0 07/21/2021   Lab Results  Component Value Date   CHOLHDL 3 07/21/2021   No results found for: HGBA1C    Assessment & Plan:   Problem List Items Addressed This Visit   None Visit Diagnoses     Flank pain    -  Primary   Relevant Medications   traMADol (ULTRAM) 50 MG tablet   Other Relevant Orders   POCT urinalysis dipstick (Completed)   CBC with Differential/Platelet   Comprehensive metabolic panel   TSH   Urinalysis, Routine w reflex microscopic   Urine Culture   Urine cytology ancillary only([place])   CT Abdomen Pelvis W Contrast   RLQ abdominal pain       Relevant Medications   traMADol (ULTRAM) 50 MG tablet   Other Relevant Orders   CBC with Differential/Platelet   Comprehensive metabolic panel   TSH   Urinalysis, Routine w reflex microscopic   Urine Culture   Urine cytology ancillary only([place])   CT Abdomen Pelvis W Contrast   RUQ pain       Relevant Medications   traMADol (ULTRAM) 50 MG tablet   Other Relevant Orders   CBC with Differential/Platelet   Comprehensive metabolic panel   TSH   Urinalysis, Routine w reflex microscopic   Urine Culture    Urine cytology ancillary only([place])   CT Abdomen Pelvis W Contrast       Meds ordered this encounter  Medications   traMADol (ULTRAM) 50 MG tablet    Sig: Take 1 tablet (50 mg total) by mouth every 8 (eight) hours as needed for up to 5 days.    Dispense:  15 tablet    Refill:  0    Order Specific Question:   Supervising Provider    Answer:   Carlota Raspberry, JEFFREY R [[id number]]     Follow-up: Return if symptoms worsen or fail to improve.   PLAN Poct ua not suggestive of UTI. Will pursue further labs and CT abd/pel w contrast Suspect gallstones vs ovarian cyst with possible rupture. Given improvement of symptoms, my concern is fairly limited. Will give tramadol for prn pain Follow up pending labs Patient encouraged to call clinic with any questions, comments, or concerns.   Maximiano Coss, NP

## 2021-08-19 ENCOUNTER — Encounter: Payer: Self-pay | Admitting: Registered Nurse

## 2021-08-19 LAB — URINALYSIS, ROUTINE W REFLEX MICROSCOPIC
Bilirubin Urine: NEGATIVE
Hgb urine dipstick: NEGATIVE
Ketones, ur: NEGATIVE
Leukocytes,Ua: NEGATIVE
Nitrite: NEGATIVE
RBC / HPF: NONE SEEN (ref 0–?)
Specific Gravity, Urine: <=1.005 — AB (ref 1.000–1.030)
Total Protein, Urine: NEGATIVE
Urine Glucose: NEGATIVE
Urobilinogen, UA: 0.2 (ref 0.0–1.0)
WBC, UA: NONE SEEN (ref 0–?)
pH: 6 (ref 5.0–8.0)

## 2021-08-19 LAB — URINE CULTURE
MICRO NUMBER:: 12572289
Result:: NO GROWTH
SPECIMEN QUALITY:: ADEQUATE

## 2021-08-20 ENCOUNTER — Ambulatory Visit
Admission: RE | Admit: 2021-08-20 | Discharge: 2021-08-20 | Disposition: A | Discharge status: Home / Self Care | Payer: 59 | Source: Ambulatory Visit | Attending: Registered Nurse | Admitting: Registered Nurse

## 2021-08-20 ENCOUNTER — Other Ambulatory Visit: Payer: Self-pay

## 2021-08-20 DIAGNOSIS — D259 Leiomyoma of uterus, unspecified: Secondary | ICD-10-CM | POA: Diagnosis not present

## 2021-08-20 DIAGNOSIS — R1011 Right upper quadrant pain: Secondary | ICD-10-CM

## 2021-08-20 DIAGNOSIS — R109 Unspecified abdominal pain: Secondary | ICD-10-CM

## 2021-08-20 DIAGNOSIS — R1031 Right lower quadrant pain: Secondary | ICD-10-CM

## 2021-08-20 LAB — URINE CYTOLOGY ANCILLARY ONLY
Bacterial Vaginitis-Urine: NEGATIVE
Candida Urine: NEGATIVE
Chlamydia: NEGATIVE
Comment: NEGATIVE
Comment: NEGATIVE
Comment: NORMAL
Neisseria Gonorrhea: NEGATIVE
Trichomonas: NEGATIVE

## 2021-08-20 MED ORDER — IOPAMIDOL (ISOVUE-300) INJECTION 61%
100.0000 mL | Freq: Once | INTRAVENOUS | Status: AC | PRN
  Administered 2021-08-20: 100 mL via INTRAVENOUS

## 2021-08-22 DIAGNOSIS — R102 Pelvic and perineal pain: Secondary | ICD-10-CM | POA: Diagnosis not present

## 2021-08-22 DIAGNOSIS — D219 Benign neoplasm of connective and other soft tissue, unspecified: Secondary | ICD-10-CM | POA: Diagnosis not present

## 2021-08-25 DIAGNOSIS — R109 Unspecified abdominal pain: Secondary | ICD-10-CM | POA: Diagnosis not present

## 2021-08-25 DIAGNOSIS — S39011A Strain of muscle, fascia and tendon of abdomen, initial encounter: Secondary | ICD-10-CM | POA: Diagnosis not present

## 2021-08-25 DIAGNOSIS — R102 Pelvic and perineal pain: Secondary | ICD-10-CM | POA: Diagnosis not present

## 2021-09-09 ENCOUNTER — Other Ambulatory Visit (HOSPITAL_COMMUNITY): Payer: Self-pay

## 2021-10-02 ENCOUNTER — Encounter: Payer: Self-pay | Admitting: Registered Nurse

## 2021-10-02 NOTE — Telephone Encounter (Signed)
Patient is having a problem with the way her visit was billed can you see the message below.

## 2021-10-06 ENCOUNTER — Telehealth: Payer: Self-pay

## 2021-10-06 NOTE — Telephone Encounter (Signed)
Error

## 2021-10-09 DIAGNOSIS — R002 Palpitations: Secondary | ICD-10-CM | POA: Diagnosis not present

## 2021-10-09 DIAGNOSIS — Z8249 Family history of ischemic heart disease and other diseases of the circulatory system: Secondary | ICD-10-CM | POA: Diagnosis not present

## 2021-10-09 DIAGNOSIS — I498 Other specified cardiac arrhythmias: Secondary | ICD-10-CM | POA: Diagnosis not present

## 2021-10-09 DIAGNOSIS — R55 Syncope and collapse: Secondary | ICD-10-CM | POA: Diagnosis not present

## 2021-10-09 DIAGNOSIS — I422 Other hypertrophic cardiomyopathy: Secondary | ICD-10-CM | POA: Diagnosis not present

## 2021-10-27 DIAGNOSIS — I498 Other specified cardiac arrhythmias: Secondary | ICD-10-CM | POA: Diagnosis not present

## 2021-12-10 ENCOUNTER — Other Ambulatory Visit (HOSPITAL_COMMUNITY): Payer: Self-pay

## 2022-01-19 ENCOUNTER — Encounter: Payer: Self-pay | Admitting: Family Medicine

## 2022-01-19 ENCOUNTER — Ambulatory Visit (INDEPENDENT_AMBULATORY_CARE_PROVIDER_SITE_OTHER): Payer: 59 | Admitting: Family Medicine

## 2022-01-19 VITALS — BP 140/100 | HR 70 | Temp 98.1°F | Resp 16 | Ht 63.5 in | Wt 209.4 lb

## 2022-01-19 DIAGNOSIS — E559 Vitamin D deficiency, unspecified: Secondary | ICD-10-CM | POA: Diagnosis not present

## 2022-01-19 DIAGNOSIS — Z1231 Encounter for screening mammogram for malignant neoplasm of breast: Secondary | ICD-10-CM | POA: Diagnosis not present

## 2022-01-19 DIAGNOSIS — R030 Elevated blood-pressure reading, without diagnosis of hypertension: Secondary | ICD-10-CM | POA: Insufficient documentation

## 2022-01-19 DIAGNOSIS — Z0000 Encounter for general adult medical examination without abnormal findings: Secondary | ICD-10-CM

## 2022-01-19 DIAGNOSIS — E538 Deficiency of other specified B group vitamins: Secondary | ICD-10-CM

## 2022-01-19 DIAGNOSIS — E669 Obesity, unspecified: Secondary | ICD-10-CM | POA: Diagnosis not present

## 2022-01-19 LAB — BASIC METABOLIC PANEL
BUN: 16 mg/dL (ref 6–23)
CO2: 25 mEq/L (ref 19–32)
Calcium: 9.7 mg/dL (ref 8.4–10.5)
Chloride: 105 mEq/L (ref 96–112)
Creatinine, Ser: 0.65 mg/dL (ref 0.40–1.20)
GFR: 108.35 mL/min (ref >60.00)
Glucose, Bld: 98 mg/dL (ref 70–99)
Potassium: 4 mEq/L (ref 3.5–5.1)
Sodium: 140 mEq/L (ref 135–145)

## 2022-01-19 LAB — CBC WITH DIFFERENTIAL/PLATELET
Basophils Absolute: 0 10*3/uL (ref 0.0–0.1)
Basophils Relative: 0.6 % (ref 0.0–3.0)
Eosinophils Absolute: 0.2 10*3/uL (ref 0.0–0.7)
Eosinophils Relative: 2.9 % (ref 0.0–5.0)
HCT: 38.2 % (ref 36.0–46.0)
Hemoglobin: 12.7 g/dL (ref 12.0–15.0)
Lymphocytes Relative: 35.8 % (ref 12.0–46.0)
Lymphs Abs: 2.6 10*3/uL (ref 0.7–4.0)
MCHC: 33.2 g/dL (ref 30.0–36.0)
MCV: 87.6 fl (ref 78.0–100.0)
Monocytes Absolute: 0.4 10*3/uL (ref 0.1–1.0)
Monocytes Relative: 5.9 % (ref 3.0–12.0)
Neutro Abs: 4 10*3/uL (ref 1.4–7.7)
Neutrophils Relative %: 54.8 % (ref 43.0–77.0)
Platelets: 271 10*3/uL (ref 150.0–400.0)
RBC: 4.36 Mil/uL (ref 3.87–5.11)
RDW: 14.5 % (ref 11.5–15.5)
WBC: 7.3 10*3/uL (ref 4.0–10.5)

## 2022-01-19 LAB — LIPID PANEL
Cholesterol: 223 mg/dL — ABNORMAL HIGH (ref 0–200)
HDL: 52.1 mg/dL (ref >39.00)
LDL Cholesterol: 134 mg/dL — ABNORMAL HIGH (ref 0–99)
NonHDL: 170.75
Total CHOL/HDL Ratio: 4
Triglycerides: 182 mg/dL — ABNORMAL HIGH (ref 0.0–149.0)
VLDL: 36.4 mg/dL (ref 0.0–40.0)

## 2022-01-19 LAB — HEPATIC FUNCTION PANEL
ALT: 14 U/L (ref 0–35)
AST: 15 U/L (ref 0–37)
Albumin: 4.4 g/dL (ref 3.5–5.2)
Alkaline Phosphatase: 55 U/L (ref 39–117)
Bilirubin, Direct: 0.1 mg/dL (ref 0.0–0.3)
Total Bilirubin: 0.2 mg/dL (ref 0.2–1.2)
Total Protein: 6.5 g/dL (ref 6.0–8.3)

## 2022-01-19 LAB — VITAMIN D 25 HYDROXY (VIT D DEFICIENCY, FRACTURES): VITD: 20.91 ng/mL — ABNORMAL LOW (ref 30.00–100.00)

## 2022-01-19 LAB — VITAMIN B12: Vitamin B-12: 595 pg/mL (ref 211–911)

## 2022-01-19 LAB — TSH: TSH: 2.23 u[IU]/mL (ref 0.35–5.50)

## 2022-01-19 NOTE — Assessment & Plan Note (Signed)
Pt's PE WNL w/ exception of obesity.  UTD on pap.  Due for repeat mammo- order entered.  UTD on flu, Tdap.  Check labs.  Anticipatory guidance provided.  ?

## 2022-01-19 NOTE — Progress Notes (Signed)
? ?  Subjective:  ? ? Patient ID: Rose Bell, female    DOB: 1980/12/14, 41 y.o.   MRN: [id number] ? ?HPI ?CPE- UTD on flu, pap, Tdap.  Due for repeat mammo ? ?Health Maintenance  ?Topic Date Due  ? COVID-19 Vaccine (3 - Booster for Pfizer series) 03/15/2020  ? Hepatitis C Screening  07/21/2022 (Originally 04/25/1997)  ? INFLUENZA VACCINE  05/19/2022  ? PAP SMEAR-Modifier  12/05/2022  ? TETANUS/TDAP  01/23/2024  ? HIV Screening  Completed  ? HPV VACCINES  Aged Out  ?  ? ? ?Review of Systems ?Patient reports no vision/ hearing changes, adenopathy,fever, persistant/recurrent hoarseness , swallowing issues, chest pain, palpitations, edema, persistant/recurrent cough, hemoptysis, dyspnea (rest/exertional/paroxysmal nocturnal), gastrointestinal bleeding (melena, rectal bleeding), abdominal pain, significant heartburn, bowel changes, GU symptoms (dysuria, hematuria, incontinence), Gyn symptoms (abnormal  bleeding, pain),  syncope, focal weakness, memory loss, numbness & tingling, skin/hair/nail changes, abnormal bruising or bleeding, anxiety, or depression.  ? ?+ 10 lb weight gain ?+ HAs ? ?   ?Objective:  ? Physical Exam ?General Appearance:    Alert, cooperative, no distress, appears stated age, obese  ?Head:    Normocephalic, without obvious abnormality, atraumatic  ?Eyes:    PERRL, conjunctiva/corneas clear, EOM's intact both eyes  ?Ears:    Normal TM's and external ear canals, both ears  ?Nose:   Nares normal, septum midline, mucosa normal, no drainage  ?  or sinus tenderness  ?Throat:   Lips, mucosa, and tongue normal; teeth and gums normal  ?Neck:   Supple, symmetrical, trachea midline, no adenopathy;  ?  Thyroid: no enlargement/tenderness/nodules  ?Back:     Symmetric, no curvature, ROM normal, no CVA tenderness  ?Lungs:     Clear to auscultation bilaterally, respirations unlabored  ?Chest Wall:    No tenderness or deformity  ? Heart:    Regular rate and rhythm, S1 and S2 normal, no murmur, rub ?  or gallop   ?Breast Exam:    Deferred to mammo  ?Abdomen:     Soft, non-tender, bowel sounds active all four quadrants,  ?  no masses, no organomegaly  ?Genitalia:    Deferred  ?Rectal:    ?Extremities:   Extremities normal, atraumatic, no cyanosis or edema  ?Pulses:   2+ and symmetric all extremities  ?Skin:   Skin color, texture, turgor normal, no rashes or lesions  ?Lymph nodes:   Cervical, supraclavicular, and axillary nodes normal  ?Neurologic:   CNII-XII intact, normal strength, sensation and reflexes  ?  throughout  ?  ? ? ? ?   ?Assessment & Plan:  ? ? ?

## 2022-01-19 NOTE — Assessment & Plan Note (Signed)
Check labs and replete prn. 

## 2022-01-19 NOTE — Assessment & Plan Note (Signed)
Deteriorated.  Pt has gained 10 lbs since last visit.  As of 3-4 weeks ago she has committed to exercising 3x/week and is keeping track on a calendar.  Applauded her efforts.  Encouraged her to try and limit her snacking in between meals as the next change.  Will follow. ?

## 2022-01-19 NOTE — Assessment & Plan Note (Signed)
New.  Pt's BP w/ me in October was 122/80.  Today is much higher.  Did not come down by end of visit.  Will have her f/u in 6 weeks to recheck BP and start meds if needed. ?

## 2022-01-19 NOTE — Patient Instructions (Addendum)
Follow up in 6 weeks to recheck BP ?We'll notify you of your lab results and make any changes if needed ?Continue to work on healthy diet and regular exercise- you can do it! ?They should call you to schedule your mammogram- I put the order in ?Try tracking your headaches and see if they're hormonal ?Call with any questions or concerns ?Stay Safe!  Stay Healthy! ?Happy Spring!!! ? ?

## 2022-01-20 ENCOUNTER — Other Ambulatory Visit: Payer: Self-pay

## 2022-01-20 ENCOUNTER — Other Ambulatory Visit: Payer: Self-pay | Admitting: Family Medicine

## 2022-01-20 ENCOUNTER — Other Ambulatory Visit (HOSPITAL_COMMUNITY): Payer: Self-pay

## 2022-01-20 ENCOUNTER — Encounter: Payer: Self-pay | Admitting: Family Medicine

## 2022-01-20 DIAGNOSIS — E782 Mixed hyperlipidemia: Secondary | ICD-10-CM

## 2022-01-20 MED ORDER — ROSUVASTATIN CALCIUM 10 MG PO TABS
10.0000 mg | ORAL_TABLET | Freq: Every day | ORAL | 3 refills | Status: DC
  Filled 2022-01-20: qty 30, 30d supply, fill #0
  Filled 2022-02-18: qty 30, 30d supply, fill #1
  Filled 2022-03-19: qty 30, 30d supply, fill #2
  Filled 2022-04-23: qty 30, 30d supply, fill #3

## 2022-01-20 MED ORDER — VITAMIN D (ERGOCALCIFEROL) 1.25 MG (50000 UNIT) PO CAPS
50000.0000 [IU] | ORAL_CAPSULE | ORAL | 0 refills | Status: DC
  Filled 2022-01-20: qty 12, 84d supply, fill #0

## 2022-02-16 ENCOUNTER — Ambulatory Visit
Admission: RE | Admit: 2022-02-16 | Discharge: 2022-02-16 | Disposition: A | Discharge status: Home / Self Care | Payer: 59 | Source: Ambulatory Visit | Attending: Family Medicine | Admitting: Family Medicine

## 2022-02-16 DIAGNOSIS — Z1231 Encounter for screening mammogram for malignant neoplasm of breast: Secondary | ICD-10-CM | POA: Diagnosis not present

## 2022-02-18 ENCOUNTER — Other Ambulatory Visit (HOSPITAL_COMMUNITY): Payer: Self-pay

## 2022-03-02 ENCOUNTER — Ambulatory Visit: Payer: 59 | Admitting: Family Medicine

## 2022-03-20 ENCOUNTER — Other Ambulatory Visit (HOSPITAL_COMMUNITY): Payer: Self-pay

## 2022-04-23 ENCOUNTER — Other Ambulatory Visit (HOSPITAL_COMMUNITY): Payer: Self-pay

## 2022-04-27 ENCOUNTER — Other Ambulatory Visit (HOSPITAL_COMMUNITY): Payer: Self-pay

## 2022-05-13 ENCOUNTER — Ambulatory Visit: Payer: 59 | Admitting: Family Medicine

## 2022-05-13 ENCOUNTER — Encounter: Payer: Self-pay | Admitting: Family Medicine

## 2022-05-13 VITALS — BP 124/82 | HR 77 | Temp 97.6°F | Resp 20 | Ht 63.0 in | Wt 205.4 lb

## 2022-05-13 DIAGNOSIS — E669 Obesity, unspecified: Secondary | ICD-10-CM | POA: Diagnosis not present

## 2022-05-13 DIAGNOSIS — R030 Elevated blood-pressure reading, without diagnosis of hypertension: Secondary | ICD-10-CM

## 2022-05-13 DIAGNOSIS — E782 Mixed hyperlipidemia: Secondary | ICD-10-CM | POA: Diagnosis not present

## 2022-05-13 LAB — BASIC METABOLIC PANEL
BUN: 14 mg/dL (ref 6–23)
CO2: 25 mEq/L (ref 19–32)
Calcium: 9.2 mg/dL (ref 8.4–10.5)
Chloride: 104 mEq/L (ref 96–112)
Creatinine, Ser: 0.76 mg/dL (ref 0.40–1.20)
GFR: 96.22 mL/min (ref >60.00)
Glucose, Bld: 97 mg/dL (ref 70–99)
Potassium: 3.5 mEq/L (ref 3.5–5.1)
Sodium: 137 mEq/L (ref 135–145)

## 2022-05-13 LAB — CBC WITH DIFFERENTIAL/PLATELET
Basophils Absolute: 0 10*3/uL (ref 0.0–0.1)
Basophils Relative: 0.4 % (ref 0.0–3.0)
Eosinophils Absolute: 0.1 10*3/uL (ref 0.0–0.7)
Eosinophils Relative: 2.2 % (ref 0.0–5.0)
HCT: 38 % (ref 36.0–46.0)
Hemoglobin: 12.9 g/dL (ref 12.0–15.0)
Lymphocytes Relative: 36.9 % (ref 12.0–46.0)
Lymphs Abs: 2.5 10*3/uL (ref 0.7–4.0)
MCHC: 34.1 g/dL (ref 30.0–36.0)
MCV: 86.8 fl (ref 78.0–100.0)
Monocytes Absolute: 0.4 10*3/uL (ref 0.1–1.0)
Monocytes Relative: 6.6 % (ref 3.0–12.0)
Neutro Abs: 3.6 10*3/uL (ref 1.4–7.7)
Neutrophils Relative %: 53.9 % (ref 43.0–77.0)
Platelets: 262 10*3/uL (ref 150.0–400.0)
RBC: 4.38 Mil/uL (ref 3.87–5.11)
RDW: 13.9 % (ref 11.5–15.5)
WBC: 6.8 10*3/uL (ref 4.0–10.5)

## 2022-05-13 LAB — LIPID PANEL
Cholesterol: 193 mg/dL (ref 0–200)
HDL: 47.5 mg/dL (ref >39.00)
LDL Cholesterol: 111 mg/dL — ABNORMAL HIGH (ref 0–99)
NonHDL: 145.86
Total CHOL/HDL Ratio: 4
Triglycerides: 173 mg/dL — ABNORMAL HIGH (ref 0.0–149.0)
VLDL: 34.6 mg/dL (ref 0.0–40.0)

## 2022-05-13 LAB — HEPATIC FUNCTION PANEL
ALT: 12 U/L (ref 0–35)
AST: 14 U/L (ref 0–37)
Albumin: 4.2 g/dL (ref 3.5–5.2)
Alkaline Phosphatase: 58 U/L (ref 39–117)
Bilirubin, Direct: 0.1 mg/dL (ref 0.0–0.3)
Total Bilirubin: 0.4 mg/dL (ref 0.2–1.2)
Total Protein: 6.9 g/dL (ref 6.0–8.3)

## 2022-05-13 LAB — TSH: TSH: 3.2 u[IU]/mL (ref 0.35–5.50)

## 2022-05-13 NOTE — Assessment & Plan Note (Signed)
Pt is down 4 lbs since last visit.  Applauded her efforts at healthy diet and regular exercise.  Will continue to follow.

## 2022-05-13 NOTE — Assessment & Plan Note (Signed)
Chronic problem.  Tolerating Crestor 10mg daily w/o difficulty.  Check labs.  Adjust meds prn  

## 2022-05-13 NOTE — Patient Instructions (Addendum)
Schedule your complete physical in April We'll notify you of your lab results and make any changes if needed Continue to work on healthy diet and regular exercise- you're doing great! Call with any questions or concerns Stay Safe!  Stay Healthy! Enjoy the rest of your summer!!!

## 2022-05-13 NOTE — Progress Notes (Signed)
   Subjective:    Patient ID: Rose Bell, female    DOB: Jun 24, 1981, 41 y.o.   MRN: [id number]  HPI Elevated BP- pt's BP is much better today.  Denies CP, SOB, HAs, visual changes, edema.  Hyperlipidemia- chronic problem, currently on Crestor '10mg'$  daily.  No abd pain, N/V.  Obesity- pt is down 4 lbs since last visit.  Pt is exercising regularly.     Review of Systems For ROS see HPI     Objective:   Physical Exam Vitals reviewed.  Constitutional:      General: She is not in acute distress.    Appearance: Normal appearance. She is well-developed. She is obese. She is not ill-appearing.  HENT:     Head: Normocephalic and atraumatic.  Eyes:     Conjunctiva/sclera: Conjunctivae normal.     Pupils: Pupils are equal, round, and reactive to light.  Neck:     Thyroid: No thyromegaly.  Cardiovascular:     Rate and Rhythm: Normal rate and regular rhythm.     Pulses: Normal pulses.     Heart sounds: Normal heart sounds. No murmur heard. Pulmonary:     Effort: Pulmonary effort is normal. No respiratory distress.     Breath sounds: Normal breath sounds.  Abdominal:     General: There is no distension.     Palpations: Abdomen is soft.     Tenderness: There is no abdominal tenderness.  Musculoskeletal:     Cervical back: Normal range of motion and neck supple.     Right lower leg: No edema.     Left lower leg: No edema.  Lymphadenopathy:     Cervical: No cervical adenopathy.  Skin:    General: Skin is warm and dry.  Neurological:     General: No focal deficit present.     Mental Status: She is alert and oriented to person, place, and time.  Psychiatric:        Mood and Affect: Mood normal.        Behavior: Behavior normal.           Assessment & Plan:

## 2022-05-13 NOTE — Assessment & Plan Note (Signed)
BP WNL.  Currently asymptomatic.  Applauded her efforts at regular exercise.  Will follow.

## 2022-05-14 NOTE — Progress Notes (Signed)
Pt seen results via my chart  

## 2022-05-26 ENCOUNTER — Other Ambulatory Visit: Payer: Self-pay | Admitting: Family Medicine

## 2022-05-26 DIAGNOSIS — E782 Mixed hyperlipidemia: Secondary | ICD-10-CM

## 2022-05-27 ENCOUNTER — Other Ambulatory Visit (HOSPITAL_COMMUNITY): Payer: Self-pay

## 2022-05-27 MED ORDER — ROSUVASTATIN CALCIUM 10 MG PO TABS
10.0000 mg | ORAL_TABLET | Freq: Every day | ORAL | 1 refills | Status: DC
  Filled 2022-05-27: qty 90, 90d supply, fill #0
  Filled 2022-08-24: qty 90, 90d supply, fill #1

## 2022-06-18 DIAGNOSIS — H524 Presbyopia: Secondary | ICD-10-CM | POA: Diagnosis not present

## 2022-06-18 DIAGNOSIS — H5203 Hypermetropia, bilateral: Secondary | ICD-10-CM | POA: Diagnosis not present

## 2022-07-06 ENCOUNTER — Encounter: Payer: Self-pay | Admitting: Family Medicine

## 2022-08-24 ENCOUNTER — Other Ambulatory Visit (HOSPITAL_COMMUNITY): Payer: Self-pay

## 2022-09-29 IMAGING — DX DG FOOT COMPLETE 3+V*R*
3 series · 3 of 3 positions shown · non-contrast
Comparison: None.

CLINICAL DATA: Twisted right foot, fall

EXAM:
RIGHT FOOT COMPLETE - 3+ VIEW

[foot ap]
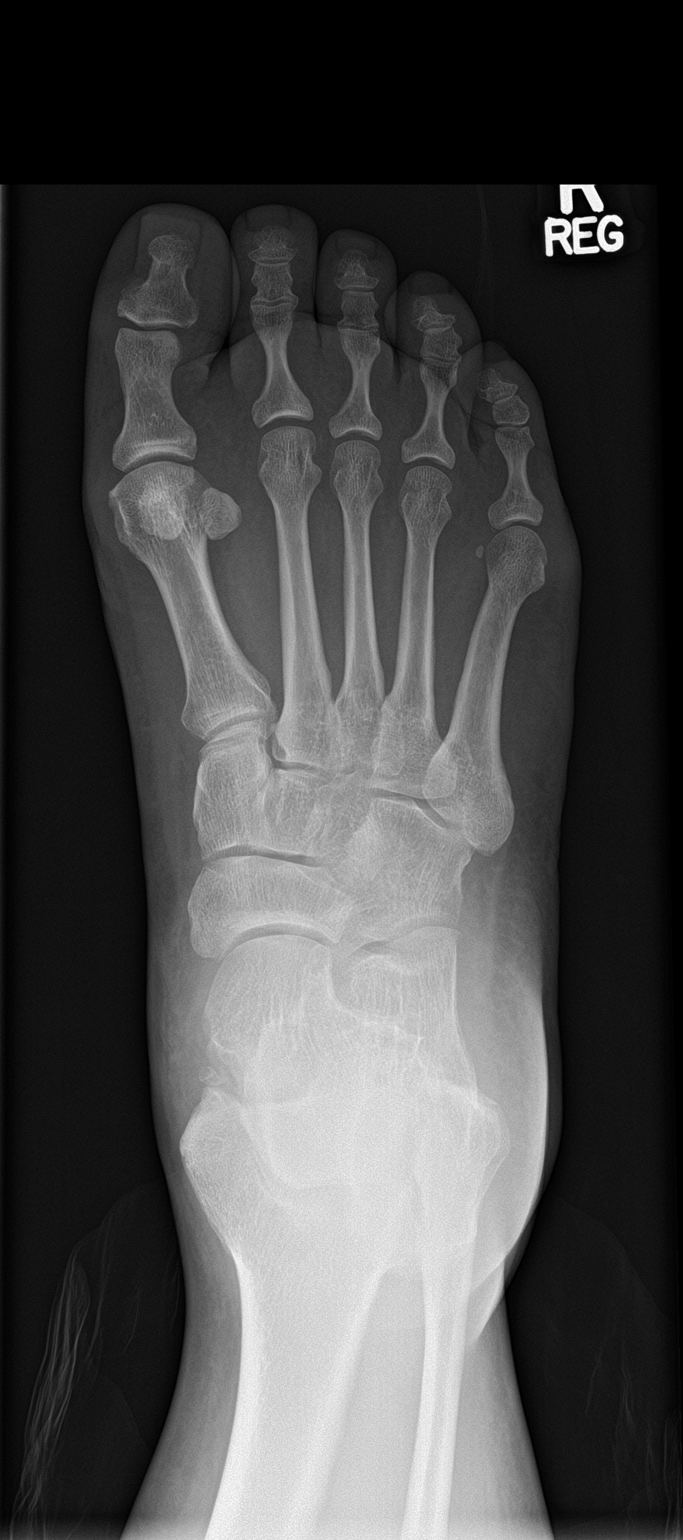

[foot obl]
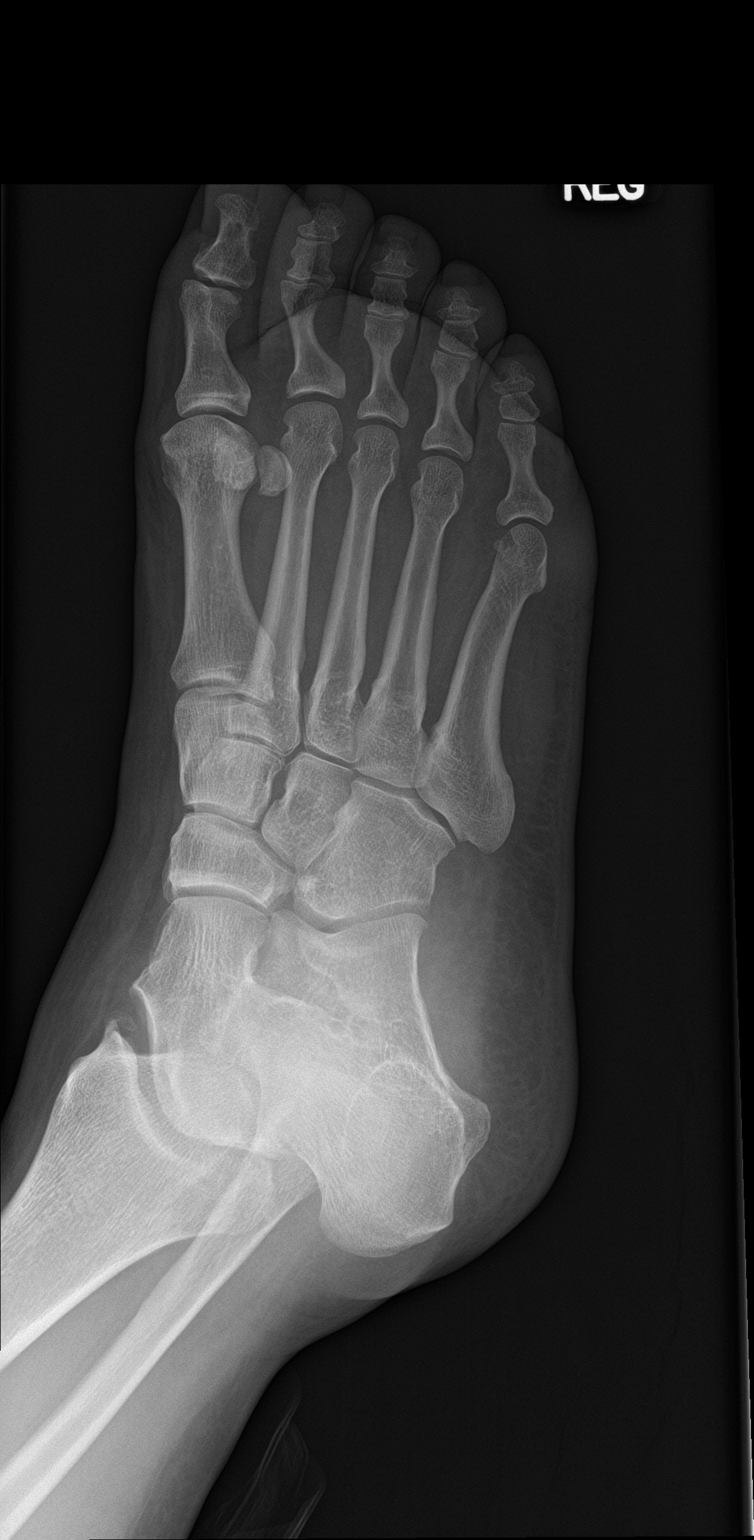

[foot lat]
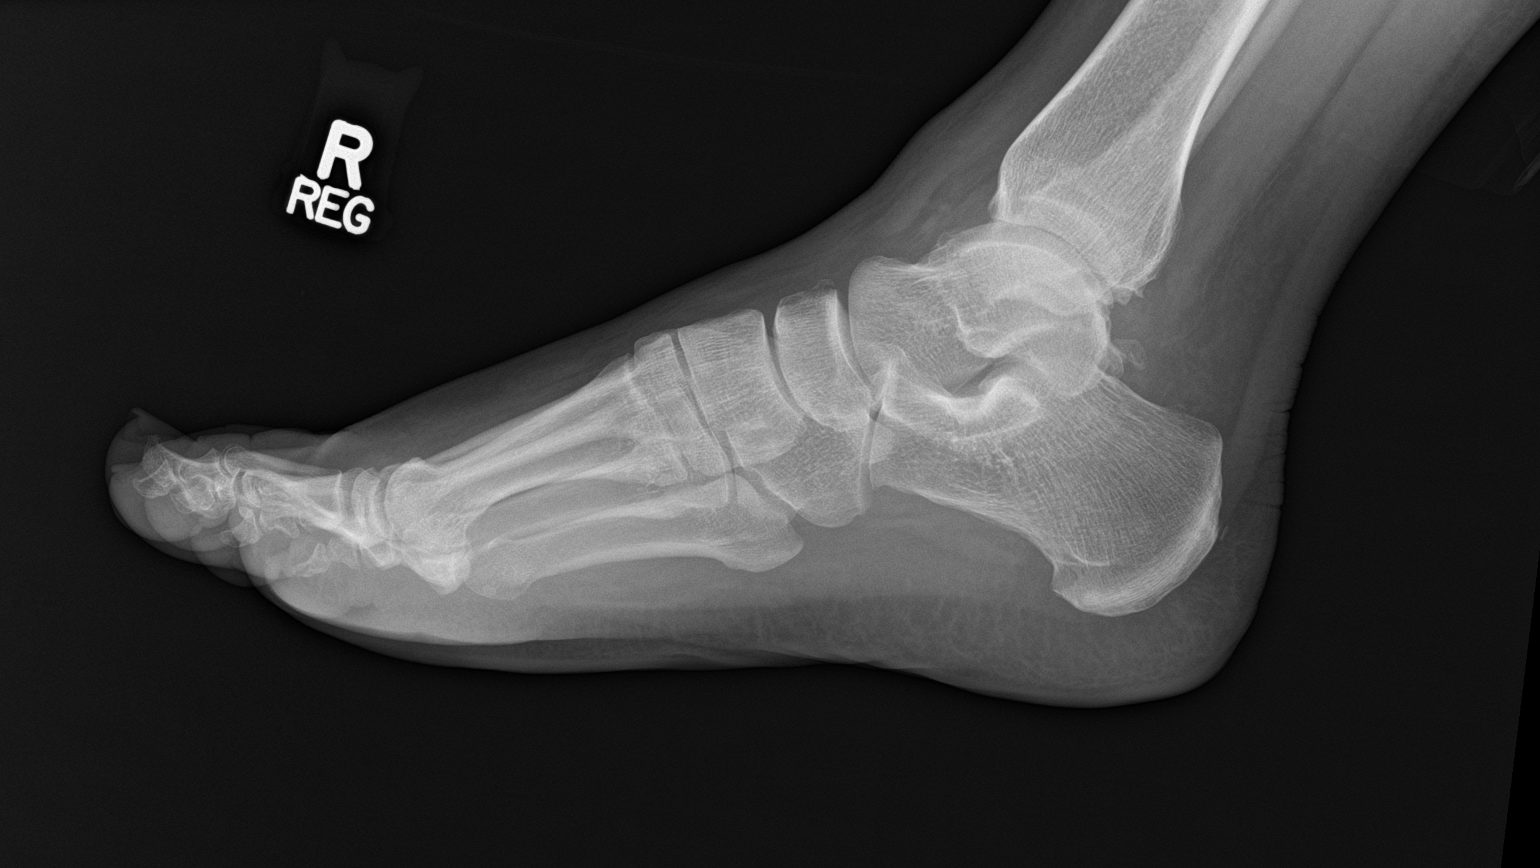

[3 of 3 positions shown; findings below may reference images not displayed]

FINDINGS: There is no evidence of fracture or dislocation. There is no
evidence of arthropathy or other focal bone abnormality. Mild soft
tissue swelling. No foreign body.
IMPRESSION: Negative.

## 2022-10-19 DIAGNOSIS — M75102 Unspecified rotator cuff tear or rupture of left shoulder, not specified as traumatic: Secondary | ICD-10-CM

## 2022-10-19 HISTORY — DX: Unspecified rotator cuff tear or rupture of left shoulder, not specified as traumatic: M75.102

## 2022-10-22 ENCOUNTER — Ambulatory Visit (INDEPENDENT_AMBULATORY_CARE_PROVIDER_SITE_OTHER): Payer: Commercial Managed Care - PPO | Admitting: Sports Medicine

## 2022-10-22 ENCOUNTER — Ambulatory Visit (INDEPENDENT_AMBULATORY_CARE_PROVIDER_SITE_OTHER): Payer: Commercial Managed Care - PPO

## 2022-10-22 DIAGNOSIS — G8929 Other chronic pain: Secondary | ICD-10-CM | POA: Diagnosis not present

## 2022-10-22 DIAGNOSIS — M25512 Pain in left shoulder: Secondary | ICD-10-CM | POA: Diagnosis not present

## 2022-10-22 DIAGNOSIS — S46012A Strain of muscle(s) and tendon(s) of the rotator cuff of left shoulder, initial encounter: Secondary | ICD-10-CM

## 2022-10-22 DIAGNOSIS — Z9889 Other specified postprocedural states: Secondary | ICD-10-CM | POA: Insufficient documentation

## 2022-10-22 DIAGNOSIS — M75102 Unspecified rotator cuff tear or rupture of left shoulder, not specified as traumatic: Secondary | ICD-10-CM | POA: Insufficient documentation

## 2022-10-22 NOTE — Assessment & Plan Note (Addendum)
Pleasant 42 year old female, she had an accidental trip and fall, caught herself with her left shoulder, and then has had increasing pain localized over the deltoid and worse with abduction, push-ups. On exam she has positive impingement signs and significant weakness to abduction concerning for a tear of the supraspinatus, she also has a positive speeds test concerning for proximal biceps tear. Due to weakness we will proceed with x-ray and early MRI with anticipation of surgical intervention. If we do see if thickness or large rotator cuff tear she would like Dr. Justice Britain for surgical consultation. If small rotator cuff fraying or bursitis we will have her do home conditioning.   MRI update: Unfortunately there are several findings on MRI including a full-thickness retracted tear of the rotator cuff, there is also a bit of arthritis in the joint, I will go ahead and do the referral to Dr. Onnie Graham for rotator cuff repair

## 2022-10-22 NOTE — Progress Notes (Addendum)
    Procedures performed today:    None.  Independent interpretation of notes and tests performed by another provider:   None.  Brief History, Exam, Impression, and Recommendations:    Rotator cuff tear, left Rose Bell 42 year old female, she had an accidental trip and fall, caught herself with her left shoulder, and then has had increasing pain localized over the deltoid and worse with abduction, push-ups. On exam she has positive impingement signs and significant weakness to abduction concerning for a tear of the supraspinatus, she also has a positive speeds test concerning for proximal biceps tear. Due to weakness we will proceed with x-ray and early MRI with anticipation of surgical intervention. If we do see if thickness or large rotator cuff tear she would like Dr. Justice Britain for surgical consultation. If small rotator cuff fraying or bursitis we will have her do home conditioning.   MRI update: Unfortunately there are several findings on MRI including a full-thickness retracted tear of the rotator cuff, there is also a bit of arthritis in the joint, I will go ahead and do the referral to Dr. Onnie Graham for rotator cuff repair    ____________________________________________ Gwen Her. Dianah Field, M.D., ABFM., CAQSM., AME. Primary Care and Sports Medicine [facility]  Adjunct Professor of [place] [employer] [facility] [employer]  Risk manager

## 2022-10-25 ENCOUNTER — Ambulatory Visit (INDEPENDENT_AMBULATORY_CARE_PROVIDER_SITE_OTHER): Payer: Commercial Managed Care - PPO

## 2022-10-25 DIAGNOSIS — M25412 Effusion, left shoulder: Secondary | ICD-10-CM | POA: Diagnosis not present

## 2022-10-25 DIAGNOSIS — S46012A Strain of muscle(s) and tendon(s) of the rotator cuff of left shoulder, initial encounter: Secondary | ICD-10-CM | POA: Diagnosis not present

## 2022-10-26 ENCOUNTER — Encounter: Payer: Self-pay | Admitting: Sports Medicine

## 2022-10-26 DIAGNOSIS — S46012A Strain of muscle(s) and tendon(s) of the rotator cuff of left shoulder, initial encounter: Secondary | ICD-10-CM

## 2022-10-26 NOTE — Addendum Note (Signed)
Addended by: Silverio Decamp on: 10/26/2022 01:21 PM   Modules accepted: Orders

## 2022-10-30 ENCOUNTER — Ambulatory Visit (INDEPENDENT_AMBULATORY_CARE_PROVIDER_SITE_OTHER): Payer: Commercial Managed Care - PPO | Admitting: Orthopaedic Surgery

## 2022-10-30 ENCOUNTER — Other Ambulatory Visit (HOSPITAL_BASED_OUTPATIENT_CLINIC_OR_DEPARTMENT_OTHER): Payer: Self-pay

## 2022-10-30 ENCOUNTER — Ambulatory Visit (HOSPITAL_BASED_OUTPATIENT_CLINIC_OR_DEPARTMENT_OTHER): Payer: Self-pay | Admitting: Orthopaedic Surgery

## 2022-10-30 DIAGNOSIS — S46012A Strain of muscle(s) and tendon(s) of the rotator cuff of left shoulder, initial encounter: Secondary | ICD-10-CM | POA: Diagnosis not present

## 2022-10-30 MED ORDER — OXYCODONE HCL 5 MG PO TABS
5.0000 mg | ORAL_TABLET | ORAL | 0 refills | Status: DC | PRN
  Filled 2022-10-30: qty 20, 4d supply, fill #0

## 2022-10-30 MED ORDER — ASPIRIN 325 MG PO TBEC
325.0000 mg | DELAYED_RELEASE_TABLET | Freq: Every day | ORAL | 0 refills | Status: DC
  Filled 2022-10-30: qty 30, 30d supply, fill #0

## 2022-10-30 MED ORDER — ACETAMINOPHEN 500 MG PO TABS
500.0000 mg | ORAL_TABLET | Freq: Three times a day (TID) | ORAL | 0 refills | Status: AC
  Filled 2022-10-30: qty 30, 10d supply, fill #0

## 2022-10-30 MED ORDER — IBUPROFEN 800 MG PO TABS
800.0000 mg | ORAL_TABLET | Freq: Three times a day (TID) | ORAL | 0 refills | Status: AC
  Filled 2022-10-30: qty 30, 10d supply, fill #0

## 2022-10-30 NOTE — H&P (View-Only) (Signed)
Chief Complaint: left shoulder pain     History of Present Illness:    Rose Bell is a 42 y.o. female right-hand-dominant female who presents with a left acute shoulder rotator cuff tear after she caught her arm catching herself on New Year's Day.  She felt like there was a pulling sensation at that time.  She is here today with dull and achy pain on the lateral aspect of the shoulder.  She has been able to restore some motion overhead although there is soreness and weakness and giving way.  She works here at [employer] in [employer].  She is here today with her husband who is also undergone rotator cuff surgery.  She has several young children at home.    Surgical History:   None  PMH/PSH/Family History/Social History/Meds/Allergies:    Past Medical History:  Diagnosis Date   Hyperlipemia    Hypertension    Past Surgical History:  Procedure Laterality Date   BREAST BIOPSY Right    Social History   Socioeconomic History   Marital status: Married    Spouse name: Not on file   Number of children: Not on file   Years of education: Not on file   Highest education level: Not on file  Occupational History   Not on file  Tobacco Use   Smoking status: Never   Smokeless tobacco: Never  Vaping Use   Vaping Use: Never used  Substance and Sexual Activity   Alcohol use: Yes    Comment: socially   Drug use: No   Sexual activity: Yes  Other Topics Concern   Not on file  Social History Narrative   Not on file   Social Determinants of Health   Financial Resource Strain: Not on file  Food Insecurity: Not on file  Transportation Needs: Not on file  Physical Activity: Not on file  Stress: Not on file  Social Connections: Not on file   Family History  Problem Relation Age of Onset   Diabetes Mother    Hypertension Mother    Hypothyroidism Mother    Fibroids Mother    Heart disease Father        cardiomyopathy   Other  Father        Cardio Megaly   Heart disease Paternal Uncle    Diabetes Other        father side of family   Breast cancer Neg Hx    Allergies  Allergen Reactions   Phentermine Swelling    After 3 days pt says tongue swells and was having trouble with speech   Buspirone Swelling   Bupropion Other (See Comments)    Unknown reaction    Statins Anxiety   Current Outpatient Medications  Medication Sig Dispense Refill   acetaminophen (TYLENOL) 500 MG tablet Take 1 tablet (500 mg total) by mouth every 8 (eight) hours for 10 days. 30 tablet 0   aspirin EC 325 MG tablet Take 1 tablet (325 mg total) by mouth daily. 30 tablet 0   ibuprofen (ADVIL) 800 MG tablet Take 1 tablet (800 mg total) by mouth every 8 (eight) hours for 10 days. Please take with food, please alternate with acetaminophen 30 tablet 0   oxyCODONE (OXY IR/ROXICODONE) 5 MG immediate release tablet Take 1 tablet (5 mg total) by  mouth every 4 (four) hours as needed (severe pain). 20 tablet 0   Biotin 1000 MCG tablet Take 1,000 mcg by mouth 3 (three) times daily.     Cetirizine HCl (ALLERGY, CETIRIZINE, PO) Take by mouth.     Cyanocobalamin (VITAMIN B-12 PO) Take by mouth.     rosuvastatin (CRESTOR) 10 MG tablet Take 1 tablet (10 mg total) by mouth daily. 90 tablet 1   No current facility-administered medications for this visit.   No results found.  Review of Systems:   A ROS was performed including pertinent positives and negatives as documented in the HPI.  Physical Exam :   Constitutional: NAD and appears stated age Neurological: Alert and oriented Psych: Appropriate affect and cooperative There were no vitals taken for this visit.   Comprehensive Musculoskeletal Exam:    Musculoskeletal Exam    Inspection Right Left  Skin No atrophy or winging No atrophy or winging  Palpation    Tenderness None Lateral deltoid  Range of Motion    Flexion (passive) 170 170  Flexion (active) 170 170  Abduction 170 170  ER at the  side 70 70  Can reach behind back to T12 T12  Strength     Full 4 out of 5 supraspinatus  Special Tests    Pseudoparalytic No No  Neurologic    Fires PIN, radial, median, ulnar, musculocutaneous, axillary, suprascapular, long thoracic, and spinal accessory innervated muscles. No abnormal sensibility  Vascular/Lymphatic    Radial Pulse 2+ 2+  Cervical Exam    Patient has symmetric cervical range of motion with negative Spurling's test.  Special Test: Positive Neer impingement with weakness     Imaging:   Xray (3 views left shoulder): Normal  MRI (left shoulder): There is a full-thickness tear of the supraspinatus without significant retraction  I personally reviewed and interpreted the radiographs.   Assessment:   42 y.o. female with a full-thickness acute supraspinatus tear after having the arm get caught.  I did describe the natural history of acute rotator cuff tears.  Given the fact that she is quite young and that she already has a full-thickness tear in acute setting I do believe that surgical intervention is warranted in order to perform a repair of this.  We did discuss the natural history and course of rotator cuff injuries.  I did discuss the complications inherent or specific to rotator cuff repair.  After further discussion she has elected for left shoulder arthroscopy with rotator cuff repair  Plan :    -Plan for left shoulder arthroscopy with rotator cuff repair   After a lengthy discussion of treatment options, including risks, benefits, alternatives, complications of surgical and nonsurgical conservative options, the patient elected surgical repair.   The patient  is aware of the material risks  and complications including, but not limited to injury to adjacent structures, neurovascular injury, infection, numbness, bleeding, implant failure, thermal burns, stiffness, persistent pain, failure to heal, disease transmission from allograft, need for further surgery,  dislocation, anesthetic risks, blood clots, risks of death,and others. The probabilities of surgical success and failure discussed with patient given their particular co-morbidities.The time and nature of expected rehabilitation and recovery was discussed.The patient's questions were all answered preoperatively.  No barriers to understanding were noted. I explained the natural history of the disease process and Rx rationale.  I explained to the patient what I considered to be reasonable expectations given their personal situation.  The final treatment plan was arrived at through a  shared patient decision making process model.      I personally saw and evaluated the patient, and participated in the management and treatment plan.  Vanetta Mulders, MD Attending Physician, Orthopedic Surgery  This document was dictated using Dragon voice recognition software. A reasonable attempt at proof reading has been made to minimize errors.

## 2022-10-30 NOTE — Progress Notes (Signed)
Chief Complaint: left shoulder pain     History of Present Illness:    Rose Bell is a 42 y.o. female right-hand-dominant female who presents with a left acute shoulder rotator cuff tear after she caught her arm catching herself on New Year's Day.  She felt like there was a pulling sensation at that time.  She is here today with dull and achy pain on the lateral aspect of the shoulder.  She has been able to restore some motion overhead although there is soreness and weakness and giving way.  She works here at [employer] in [employer].  She is here today with her husband who is also undergone rotator cuff surgery.  She has several young children at home.    Surgical History:   None  PMH/PSH/Family History/Social History/Meds/Allergies:    Past Medical History:  Diagnosis Date   Hyperlipemia    Hypertension    Past Surgical History:  Procedure Laterality Date   BREAST BIOPSY Right    Social History   Socioeconomic History   Marital status: Married    Spouse name: Not on file   Number of children: Not on file   Years of education: Not on file   Highest education level: Not on file  Occupational History   Not on file  Tobacco Use   Smoking status: Never   Smokeless tobacco: Never  Vaping Use   Vaping Use: Never used  Substance and Sexual Activity   Alcohol use: Yes    Comment: socially   Drug use: No   Sexual activity: Yes  Other Topics Concern   Not on file  Social History Narrative   Not on file   Social Determinants of Health   Financial Resource Strain: Not on file  Food Insecurity: Not on file  Transportation Needs: Not on file  Physical Activity: Not on file  Stress: Not on file  Social Connections: Not on file   Family History  Problem Relation Age of Onset   Diabetes Mother    Hypertension Mother    Hypothyroidism Mother    Fibroids Mother    Heart disease Father        cardiomyopathy   Other  Father        Cardio Megaly   Heart disease Paternal Uncle    Diabetes Other        father side of family   Breast cancer Neg Hx    Allergies  Allergen Reactions   Phentermine Swelling    After 3 days pt says tongue swells and was having trouble with speech   Buspirone Swelling   Bupropion Other (See Comments)    Unknown reaction    Statins Anxiety   Current Outpatient Medications  Medication Sig Dispense Refill   acetaminophen (TYLENOL) 500 MG tablet Take 1 tablet (500 mg total) by mouth every 8 (eight) hours for 10 days. 30 tablet 0   aspirin EC 325 MG tablet Take 1 tablet (325 mg total) by mouth daily. 30 tablet 0   ibuprofen (ADVIL) 800 MG tablet Take 1 tablet (800 mg total) by mouth every 8 (eight) hours for 10 days. Please take with food, please alternate with acetaminophen 30 tablet 0   oxyCODONE (OXY IR/ROXICODONE) 5 MG immediate release tablet Take 1 tablet (5 mg total) by  mouth every 4 (four) hours as needed (severe pain). 20 tablet 0   Biotin 1000 MCG tablet Take 1,000 mcg by mouth 3 (three) times daily.     Cetirizine HCl (ALLERGY, CETIRIZINE, PO) Take by mouth.     Cyanocobalamin (VITAMIN B-12 PO) Take by mouth.     rosuvastatin (CRESTOR) 10 MG tablet Take 1 tablet (10 mg total) by mouth daily. 90 tablet 1   No current facility-administered medications for this visit.   No results found.  Review of Systems:   A ROS was performed including pertinent positives and negatives as documented in the HPI.  Physical Exam :   Constitutional: NAD and appears stated age Neurological: Alert and oriented Psych: Appropriate affect and cooperative There were no vitals taken for this visit.   Comprehensive Musculoskeletal Exam:    Musculoskeletal Exam    Inspection Right Left  Skin No atrophy or winging No atrophy or winging  Palpation    Tenderness None Lateral deltoid  Range of Motion    Flexion (passive) 170 170  Flexion (active) 170 170  Abduction 170 170  ER at the  side 70 70  Can reach behind back to T12 T12  Strength     Full 4 out of 5 supraspinatus  Special Tests    Pseudoparalytic No No  Neurologic    Fires PIN, radial, median, ulnar, musculocutaneous, axillary, suprascapular, long thoracic, and spinal accessory innervated muscles. No abnormal sensibility  Vascular/Lymphatic    Radial Pulse 2+ 2+  Cervical Exam    Patient has symmetric cervical range of motion with negative Spurling's test.  Special Test: Positive Neer impingement with weakness     Imaging:   Xray (3 views left shoulder): Normal  MRI (left shoulder): There is a full-thickness tear of the supraspinatus without significant retraction  I personally reviewed and interpreted the radiographs.   Assessment:   42 y.o. female with a full-thickness acute supraspinatus tear after having the arm get caught.  I did describe the natural history of acute rotator cuff tears.  Given the fact that she is quite young and that she already has a full-thickness tear in acute setting I do believe that surgical intervention is warranted in order to perform a repair of this.  We did discuss the natural history and course of rotator cuff injuries.  I did discuss the complications inherent or specific to rotator cuff repair.  After further discussion she has elected for left shoulder arthroscopy with rotator cuff repair  Plan :    -Plan for left shoulder arthroscopy with rotator cuff repair   After a lengthy discussion of treatment options, including risks, benefits, alternatives, complications of surgical and nonsurgical conservative options, the patient elected surgical repair.   The patient  is aware of the material risks  and complications including, but not limited to injury to adjacent structures, neurovascular injury, infection, numbness, bleeding, implant failure, thermal burns, stiffness, persistent pain, failure to heal, disease transmission from allograft, need for further surgery,  dislocation, anesthetic risks, blood clots, risks of death,and others. The probabilities of surgical success and failure discussed with patient given their particular co-morbidities.The time and nature of expected rehabilitation and recovery was discussed.The patient's questions were all answered preoperatively.  No barriers to understanding were noted. I explained the natural history of the disease process and Rx rationale.  I explained to the patient what I considered to be reasonable expectations given their personal situation.  The final treatment plan was arrived at through a  shared patient decision making process model.      I personally saw and evaluated the patient, and participated in the management and treatment plan.  Vanetta Mulders, MD Attending Physician, Orthopedic Surgery  This document was dictated using Dragon voice recognition software. A reasonable attempt at proof reading has been made to minimize errors.

## 2022-11-01 ENCOUNTER — Encounter (HOSPITAL_BASED_OUTPATIENT_CLINIC_OR_DEPARTMENT_OTHER): Payer: Self-pay | Admitting: Orthopaedic Surgery

## 2022-11-02 ENCOUNTER — Telehealth: Payer: Self-pay | Admitting: Orthopaedic Surgery

## 2022-11-02 NOTE — Telephone Encounter (Signed)
[employer] forms received. To [employer] ([employer])

## 2022-11-06 ENCOUNTER — Encounter (HOSPITAL_COMMUNITY): Payer: Self-pay | Admitting: Orthopaedic Surgery

## 2022-11-09 ENCOUNTER — Encounter (HOSPITAL_COMMUNITY): Payer: Self-pay | Admitting: Orthopaedic Surgery

## 2022-11-09 ENCOUNTER — Encounter (HOSPITAL_BASED_OUTPATIENT_CLINIC_OR_DEPARTMENT_OTHER): Payer: Self-pay | Admitting: Orthopaedic Surgery

## 2022-11-09 NOTE — Progress Notes (Signed)
PCP - Dr Annye Asa Cardiologist - n/a  Chest x-ray - n/a EKG - DOS Stress Test - n/a ECHO - 10/09/21 CE Cardiac Cath - n.a  ICD Pacemaker/Loop - n/a  Sleep Study -  n/a CPAP - none  ERAS: Clear liquids til 0930 AM DOS  STOP now taking any Aspirin (unless otherwise instructed by your surgeon), Aleve, Naproxen, Ibuprofen, Motrin, Advil, Goody's, BC's, all herbal medications, fish oil, and all vitamins.   Coronavirus Screening Do you have any of the following symptoms:  Cough yes/no: No Fever (>100.72F)  yes/no: No Runny nose yes/no: No Sore throat yes/no: No Difficulty breathing/shortness of breath  yes/no: No  Have you traveled in the last 14 days and where? yes/no: No  Patient verbalized understanding of instructions that were given via phone.

## 2022-11-10 ENCOUNTER — Ambulatory Visit (HOSPITAL_BASED_OUTPATIENT_CLINIC_OR_DEPARTMENT_OTHER): Payer: Commercial Managed Care - PPO | Admitting: Anesthesiology

## 2022-11-10 ENCOUNTER — Ambulatory Visit (HOSPITAL_COMMUNITY): Payer: Commercial Managed Care - PPO | Admitting: Anesthesiology

## 2022-11-10 ENCOUNTER — Encounter (HOSPITAL_COMMUNITY): Payer: Self-pay | Admitting: Orthopaedic Surgery

## 2022-11-10 ENCOUNTER — Encounter (HOSPITAL_COMMUNITY)
Admission: RE | Disposition: A | Discharge status: Home / Self Care | Payer: Self-pay | Source: Home / Self Care | Attending: Orthopaedic Surgery

## 2022-11-10 ENCOUNTER — Ambulatory Visit (HOSPITAL_COMMUNITY)
Admission: RE | Admit: 2022-11-10 | Discharge: 2022-11-10 | Disposition: A | Discharge status: Home / Self Care | Payer: Commercial Managed Care - PPO | Attending: Orthopaedic Surgery | Admitting: Orthopaedic Surgery

## 2022-11-10 ENCOUNTER — Other Ambulatory Visit: Payer: Self-pay

## 2022-11-10 DIAGNOSIS — S46012D Strain of muscle(s) and tendon(s) of the rotator cuff of left shoulder, subsequent encounter: Secondary | ICD-10-CM

## 2022-11-10 DIAGNOSIS — Z6835 Body mass index (BMI) 35.0-35.9, adult: Secondary | ICD-10-CM | POA: Diagnosis not present

## 2022-11-10 DIAGNOSIS — K219 Gastro-esophageal reflux disease without esophagitis: Secondary | ICD-10-CM | POA: Diagnosis not present

## 2022-11-10 DIAGNOSIS — G8918 Other acute postprocedural pain: Secondary | ICD-10-CM | POA: Diagnosis not present

## 2022-11-10 DIAGNOSIS — S46012A Strain of muscle(s) and tendon(s) of the rotator cuff of left shoulder, initial encounter: Secondary | ICD-10-CM

## 2022-11-10 DIAGNOSIS — M75122 Complete rotator cuff tear or rupture of left shoulder, not specified as traumatic: Secondary | ICD-10-CM | POA: Insufficient documentation

## 2022-11-10 DIAGNOSIS — E669 Obesity, unspecified: Secondary | ICD-10-CM | POA: Insufficient documentation

## 2022-11-10 DIAGNOSIS — I10 Essential (primary) hypertension: Secondary | ICD-10-CM | POA: Insufficient documentation

## 2022-11-10 HISTORY — PX: ARTHOSCOPIC ROTAOR CUFF REPAIR: SHX5002

## 2022-11-10 HISTORY — DX: Other seasonal allergic rhinitis: J30.2

## 2022-11-10 HISTORY — PX: SHOULDER SURGERY: SHX246

## 2022-11-10 HISTORY — DX: Pneumonia, unspecified organism: J18.9

## 2022-11-10 LAB — BASIC METABOLIC PANEL
Anion gap: 7 (ref 5–15)
BUN: 6 mg/dL (ref 6–20)
CO2: 23 mmol/L (ref 22–32)
Calcium: 9 mg/dL (ref 8.9–10.3)
Chloride: 108 mmol/L (ref 98–111)
Creatinine, Ser: 0.64 mg/dL (ref 0.44–1.00)
GFR, Estimated: >60 mL/min (ref >60)
Glucose, Bld: 97 mg/dL (ref 70–99)
Potassium: 3.6 mmol/L (ref 3.5–5.1)
Sodium: 138 mmol/L (ref 135–145)

## 2022-11-10 LAB — CBC
HCT: 36.4 % (ref 36.0–46.0)
Hemoglobin: 12.5 g/dL (ref 12.0–15.0)
MCH: 30 pg (ref 26.0–34.0)
MCHC: 34.3 g/dL (ref 30.0–36.0)
MCV: 87.5 fL (ref 80.0–100.0)
Platelets: 273 10*3/uL (ref 150–400)
RBC: 4.16 MIL/uL (ref 3.87–5.11)
RDW: 13 % (ref 11.5–15.5)
WBC: 7.5 10*3/uL (ref 4.0–10.5)
nRBC: 0 % (ref 0.0–0.2)

## 2022-11-10 LAB — POCT PREGNANCY, URINE
Preg Test, Ur: NEGATIVE
Preg Test, Ur: NEGATIVE

## 2022-11-10 SURGERY — REPAIR, ROTATOR CUFF, ARTHROSCOPIC
Anesthesia: Regional | Site: Shoulder | Laterality: Left

## 2022-11-10 MED ORDER — PHENYLEPHRINE 80 MCG/ML (10ML) SYRINGE FOR IV PUSH (FOR BLOOD PRESSURE SUPPORT)
PREFILLED_SYRINGE | INTRAVENOUS | Status: AC
  Filled 2022-11-10: qty 20

## 2022-11-10 MED ORDER — BUPIVACAINE LIPOSOME 1.3 % IJ SUSP
INTRAMUSCULAR | Status: DC | PRN
  Administered 2022-11-10: 10 mL via PERINEURAL

## 2022-11-10 MED ORDER — MIDAZOLAM HCL 2 MG/2ML IJ SOLN: 2.0000 mg | Freq: Once | INTRAMUSCULAR | Status: AC

## 2022-11-10 MED ORDER — OXYCODONE HCL 5 MG/5ML PO SOLN: 5.0000 mg | Freq: Once | ORAL | Status: DC | PRN

## 2022-11-10 MED ORDER — OXYCODONE HCL 5 MG PO TABS: 5.0000 mg | ORAL_TABLET | Freq: Once | ORAL | Status: DC | PRN

## 2022-11-10 MED ORDER — FENTANYL CITRATE (PF) 100 MCG/2ML IJ SOLN: 100.0000 ug | Freq: Once | INTRAMUSCULAR | Status: AC

## 2022-11-10 MED ORDER — AMISULPRIDE (ANTIEMETIC) 5 MG/2ML IV SOLN
10.0000 mg | Freq: Once | INTRAVENOUS | Status: AC | PRN
  Administered 2022-11-10: 10 mg via INTRAVENOUS

## 2022-11-10 MED ORDER — ACETAMINOPHEN 500 MG PO TABS
1000.0000 mg | ORAL_TABLET | Freq: Once | ORAL | Status: AC
  Administered 2022-11-10: 1000 mg via ORAL
  Filled 2022-11-10: qty 2

## 2022-11-10 MED ORDER — TRANEXAMIC ACID-NACL 1000-0.7 MG/100ML-% IV SOLN
1000.0000 mg | INTRAVENOUS | Status: AC
  Administered 2022-11-10: 1000 mg via INTRAVENOUS
  Filled 2022-11-10: qty 100

## 2022-11-10 MED ORDER — LACTATED RINGERS IV SOLN: INTRAVENOUS | Status: DC

## 2022-11-10 MED ORDER — CHLORHEXIDINE GLUCONATE 0.12 % MT SOLN
OROMUCOSAL | Status: AC
  Administered 2022-11-10: 15 mL via OROMUCOSAL
  Filled 2022-11-10: qty 15

## 2022-11-10 MED ORDER — ONDANSETRON HCL 4 MG/2ML IJ SOLN: 4.0000 mg | Freq: Once | INTRAMUSCULAR | Status: DC | PRN

## 2022-11-10 MED ORDER — EPINEPHRINE PF 1 MG/ML IJ SOLN
INTRAMUSCULAR | Status: DC | PRN
  Administered 2022-11-10: 2 mL

## 2022-11-10 MED ORDER — CEFAZOLIN SODIUM-DEXTROSE 2-4 GM/100ML-% IV SOLN
2.0000 g | INTRAVENOUS | Status: AC
  Administered 2022-11-10: 2 g via INTRAVENOUS
  Filled 2022-11-10: qty 100

## 2022-11-10 MED ORDER — ORAL CARE MOUTH RINSE: 15.0000 mL | Freq: Once | OROMUCOSAL | Status: AC

## 2022-11-10 MED ORDER — PHENYLEPHRINE 80 MCG/ML (10ML) SYRINGE FOR IV PUSH (FOR BLOOD PRESSURE SUPPORT)
PREFILLED_SYRINGE | INTRAVENOUS | Status: DC | PRN
  Administered 2022-11-10 (×3): 80 ug via INTRAVENOUS

## 2022-11-10 MED ORDER — LIDOCAINE 2% (20 MG/ML) 5 ML SYRINGE
INTRAMUSCULAR | Status: AC
  Filled 2022-11-10: qty 5

## 2022-11-10 MED ORDER — MIDAZOLAM HCL 2 MG/2ML IJ SOLN
INTRAMUSCULAR | Status: AC
  Filled 2022-11-10: qty 2

## 2022-11-10 MED ORDER — EPINEPHRINE PF 1 MG/ML IJ SOLN
INTRAMUSCULAR | Status: AC
  Filled 2022-11-10: qty 2

## 2022-11-10 MED ORDER — ROPIVACAINE HCL 5 MG/ML IJ SOLN
INTRAMUSCULAR | Status: DC | PRN
  Administered 2022-11-10: 15 mL via PERINEURAL

## 2022-11-10 MED ORDER — LIDOCAINE 2% (20 MG/ML) 5 ML SYRINGE
INTRAMUSCULAR | Status: DC | PRN
  Administered 2022-11-10: 40 mg via INTRAVENOUS

## 2022-11-10 MED ORDER — ROCURONIUM BROMIDE 10 MG/ML (PF) SYRINGE
PREFILLED_SYRINGE | INTRAVENOUS | Status: DC | PRN
  Administered 2022-11-10: 60 mg via INTRAVENOUS
  Administered 2022-11-10: 40 mg via INTRAVENOUS

## 2022-11-10 MED ORDER — BUPIVACAINE HCL (PF) 0.25 % IJ SOLN
INTRAMUSCULAR | Status: AC
  Filled 2022-11-10: qty 30

## 2022-11-10 MED ORDER — KETOROLAC TROMETHAMINE 30 MG/ML IJ SOLN: 30.0000 mg | Freq: Once | INTRAMUSCULAR | Status: DC | PRN

## 2022-11-10 MED ORDER — HYDROMORPHONE HCL 1 MG/ML IJ SOLN: 0.2500 mg | INTRAMUSCULAR | Status: DC | PRN

## 2022-11-10 MED ORDER — ONDANSETRON HCL 4 MG/2ML IJ SOLN
INTRAMUSCULAR | Status: AC
  Filled 2022-11-10: qty 2

## 2022-11-10 MED ORDER — PHENYLEPHRINE HCL-NACL 20-0.9 MG/250ML-% IV SOLN
INTRAVENOUS | Status: DC | PRN
  Administered 2022-11-10: 25 ug/min via INTRAVENOUS

## 2022-11-10 MED ORDER — PROPOFOL 10 MG/ML IV BOLUS
INTRAVENOUS | Status: DC | PRN
  Administered 2022-11-10: 20 mg via INTRAVENOUS
  Administered 2022-11-10: 150 mg via INTRAVENOUS

## 2022-11-10 MED ORDER — ROCURONIUM BROMIDE 10 MG/ML (PF) SYRINGE
PREFILLED_SYRINGE | INTRAVENOUS | Status: AC
  Filled 2022-11-10: qty 10

## 2022-11-10 MED ORDER — CHLORHEXIDINE GLUCONATE 0.12 % MT SOLN: 15.0000 mL | Freq: Once | OROMUCOSAL | Status: AC

## 2022-11-10 MED ORDER — FENTANYL CITRATE (PF) 100 MCG/2ML IJ SOLN
INTRAMUSCULAR | Status: AC
  Administered 2022-11-10: 100 ug via INTRAVENOUS
  Filled 2022-11-10: qty 2

## 2022-11-10 MED ORDER — MIDAZOLAM HCL 2 MG/2ML IJ SOLN
INTRAMUSCULAR | Status: DC | PRN
  Administered 2022-11-10: 2 mg via INTRAVENOUS

## 2022-11-10 MED ORDER — FENTANYL CITRATE (PF) 100 MCG/2ML IJ SOLN
INTRAMUSCULAR | Status: DC | PRN
  Administered 2022-11-10 (×2): 50 ug via INTRAVENOUS

## 2022-11-10 MED ORDER — GABAPENTIN 300 MG PO CAPS
300.0000 mg | ORAL_CAPSULE | Freq: Once | ORAL | Status: AC
  Administered 2022-11-10: 300 mg via ORAL
  Filled 2022-11-10: qty 1

## 2022-11-10 MED ORDER — MEPERIDINE HCL 25 MG/ML IJ SOLN: 6.2500 mg | INTRAMUSCULAR | Status: DC | PRN

## 2022-11-10 MED ORDER — FENTANYL CITRATE (PF) 250 MCG/5ML IJ SOLN
INTRAMUSCULAR | Status: AC
  Filled 2022-11-10: qty 5

## 2022-11-10 MED ORDER — SODIUM CHLORIDE 0.9 % IR SOLN
Status: DC | PRN
  Administered 2022-11-10 (×8): 3000 mL

## 2022-11-10 MED ORDER — DEXAMETHASONE SODIUM PHOSPHATE 10 MG/ML IJ SOLN
INTRAMUSCULAR | Status: AC
  Filled 2022-11-10: qty 1

## 2022-11-10 MED ORDER — PROPOFOL 10 MG/ML IV BOLUS
INTRAVENOUS | Status: AC
  Filled 2022-11-10: qty 20

## 2022-11-10 MED ORDER — AMISULPRIDE (ANTIEMETIC) 5 MG/2ML IV SOLN
INTRAVENOUS | Status: AC
  Filled 2022-11-10: qty 4

## 2022-11-10 MED ORDER — MIDAZOLAM HCL 2 MG/2ML IJ SOLN
INTRAMUSCULAR | Status: AC
  Administered 2022-11-10: 2 mg via INTRAVENOUS
  Filled 2022-11-10: qty 2

## 2022-11-10 SURGICAL SUPPLY — 62 items
ANCHOR JUGGERKNOT TRPL 2 2.9 (Anchor) IMPLANT
ANCHOR PSHLK BCMP PEK 3.5X19.5 (Anchor) IMPLANT
ANCHOR PUSHLOCK PEEK 3.5X19.5 (Anchor) ×2 IMPLANT
ANCHOR SUT JK SZ 2 2.9 DBL SL (Anchor) IMPLANT
ANCHOR SUT QUATTRO KNTLS 4.5 (Anchor) IMPLANT
BAG COUNTER SPONGE SURGICOUNT (BAG) ×1 IMPLANT
BIT DRILL JUGGERKNOT 2.9 (BIT) IMPLANT
BLADE EXCALIBUR 4.0X13 (MISCELLANEOUS) ×1 IMPLANT
BLADE SURG 11 STRL SS (BLADE) IMPLANT
CANNULA 5.75X71 LONG (CANNULA) ×1 IMPLANT
CANNULA 7X7 TWIST-IN (CANNULA) IMPLANT
CANNULA PASSPORT BUTTON (MISCELLANEOUS) IMPLANT
CANNULA TWIST IN 8.25X7CM (CANNULA) ×1 IMPLANT
CHLORAPREP W/TINT 26 (MISCELLANEOUS) ×1 IMPLANT
COOLER ICEMAN CLASSIC (MISCELLANEOUS) ×1 IMPLANT
COVER SURGICAL LIGHT HANDLE (MISCELLANEOUS) ×1 IMPLANT
CUTTER TENSIONER SUT 2-0 0 FBW (INSTRUMENTS) IMPLANT
DRAPE HALF SHEET 40X57 (DRAPES) ×1 IMPLANT
DRAPE INCISE IOBAN 66X45 STRL (DRAPES) ×1 IMPLANT
DRAPE SHOULDER BEACH CHAIR (DRAPES) ×1 IMPLANT
DRAPE U-SHAPE 47X51 STRL (DRAPES) ×2 IMPLANT
DRSG TEGADERM 4X4.75 (GAUZE/BANDAGES/DRESSINGS) ×3 IMPLANT
DW OUTFLOW CASSETTE/TUBE SET (MISCELLANEOUS) ×1 IMPLANT
GAUZE SPONGE 4X4 12PLY STRL (GAUZE/BANDAGES/DRESSINGS) ×1 IMPLANT
GAUZE XEROFORM 1X8 LF (GAUZE/BANDAGES/DRESSINGS) ×1 IMPLANT
GLOVE BIOGEL PI IND STRL 6.5 (GLOVE) ×1 IMPLANT
GLOVE BIOGEL PI IND STRL 8 (GLOVE) ×1 IMPLANT
GLOVE ECLIPSE 6.0 STRL STRAW (GLOVE) ×1 IMPLANT
GLOVE INDICATOR 8.0 STRL GRN (GLOVE) ×1 IMPLANT
GOWN STRL REUS W/ TWL LRG LVL3 (GOWN DISPOSABLE) ×2 IMPLANT
GOWN STRL REUS W/TWL LRG LVL3 (GOWN DISPOSABLE) ×2
GRAFT TISS 20X25 1 THK DERM (Tissue) IMPLANT
IMPL FIBERSTITCH STRT RC 2-0 (Anchor) IMPLANT
IMPLANT FIBERSTITCH CVD (Anchor) IMPLANT
KIT BASIN OR (CUSTOM PROCEDURE TRAY) ×1 IMPLANT
KIT TURNOVER KIT B (KITS) ×1 IMPLANT
MANIFOLD NEPTUNE II (INSTRUMENTS) ×1 IMPLANT
NDL SCORPION MULTI FIRE (NEEDLE) IMPLANT
NDL SPNL 18GX3.5 QUINCKE PK (NEEDLE) ×1 IMPLANT
NEEDLE SCORPION MULTI FIRE (NEEDLE) IMPLANT
NEEDLE SPNL 18GX3.5 QUINCKE PK (NEEDLE) ×1 IMPLANT
NS IRRIG 1000ML POUR BTL (IV SOLUTION) ×1 IMPLANT
PACK ARTHROSCOPY DSU (CUSTOM PROCEDURE TRAY) ×1 IMPLANT
PACK SHOULDER (CUSTOM PROCEDURE TRAY) ×1 IMPLANT
PAD ARMBOARD 7.5X6 YLW CONV (MISCELLANEOUS) ×2 IMPLANT
PASSPORT BUTTON CANNULA (MISCELLANEOUS) ×1
PORT APPOLLO RF 90DEGREE MULTI (SURGICAL WAND) ×1 IMPLANT
PORTAL SKID DEVICE (INSTRUMENTS) IMPLANT
RESTRAINT HEAD UNIVERSAL NS (MISCELLANEOUS) ×1 IMPLANT
SLING ARM IMMOBILIZER LRG (SOFTGOODS) IMPLANT
SPONGE T-LAP 4X18 ~~LOC~~+RFID (SPONGE) ×1 IMPLANT
SPREADER GRAFT (MISCELLANEOUS) IMPLANT
SUT 0 FIBERLINK 1.5 FWIRE CLS (SUTURE) ×2
SUT ETHILON 3 0 PS 1 (SUTURE) ×1 IMPLANT
SUTURE 0 FIBRLNK 1.5 FWIRE CLS (SUTURE) IMPLANT
SYR 30ML LL (SYRINGE) ×1 IMPLANT
TISSUE ARTHROFLEX THICK 4 (Tissue) ×1 IMPLANT
TOWEL GREEN STERILE (TOWEL DISPOSABLE) ×1 IMPLANT
TOWEL GREEN STERILE FF (TOWEL DISPOSABLE) ×1 IMPLANT
TUBE CONNECTING 12X1/4 (SUCTIONS) ×1 IMPLANT
TUBING ARTHROSCOPY IRRIG 16FT (MISCELLANEOUS) ×1 IMPLANT
WATER STERILE IRR 1000ML POUR (IV SOLUTION) ×1 IMPLANT

## 2022-11-10 NOTE — Anesthesia Procedure Notes (Signed)
Procedure Name: Intubation Date/Time: 11/10/2022 12:53 PM  Performed by: Moshe Salisbury, CRNAPre-anesthesia Checklist: Patient identified, Emergency Drugs available, Suction available and Patient being monitored Patient Re-evaluated:Patient Re-evaluated prior to induction Oxygen Delivery Method: Circle System Utilized Preoxygenation: Pre-oxygenation with 100% oxygen Induction Type: IV induction Ventilation: Mask ventilation without difficulty Laryngoscope Size: Mac and 3 Grade View: Grade I Tube type: Oral Tube size: 7.5 mm Number of attempts: 1 Airway Equipment and Method: Stylet Placement Confirmation: ETT inserted through vocal cords under direct vision, positive ETCO2 and breath sounds checked- equal and bilateral Secured at: 22 cm Tube secured with: Tape Dental Injury: Teeth and Oropharynx as per pre-operative assessment

## 2022-11-10 NOTE — Brief Op Note (Signed)
   Brief Op Note  Date of Surgery: 11/10/2022  Preoperative Diagnosis: LEFT ROTATOR CUFF TEAR  Postoperative Diagnosis: same  Procedure: Procedure(s): LEFT SHOULDER ARTHROSCOPY WITH ROTATOR CUFF REPAIR  Implants: Implant Name Type Inv. Item Serial No. Manufacturer Lot No. LRB No. Used Action  TISSUE ARTHROFLEX THICK 4 - [id number] Tissue TISSUE ARTHROFLEX THICK 4 [id number] [employer]  Left 1 Implanted  ANCHOR SUT JK SZ 2 2.9 DBL SL - [id number] Anchor ANCHOR SUT JK SZ 2 2.9 DBL SL  [employer](ORTH,TRAU,BIO,SG) [id number] Left 1 Implanted  ANCHOR JUGGERKNOT TRPL 2 2.9 - [id number] Anchor ANCHOR JUGGERKNOT TRPL 2 2.9  [employer](ORTH,TRAU,BIO,SG) [id number] Left 1 Implanted  ANCHOR SUT QUATTRO KNTLS 4.5 - [id number] Anchor ANCHOR SUT QUATTRO KNTLS 4.5  [employer](ORTH,TRAU,BIO,SG) [id number] Left 2 Implanted  IMPL FIBERSTITCH STRT RC 2-0 - [id number] Anchor IMPL FIBERSTITCH STRT RC 2-0  [employer] [id number] Left 2 Implanted  IMPLANT FIBERSTITCH CVD - [id number] Anchor IMPLANT FIBERSTITCH CVD  [employer] [id number] Left 1 Implanted  IMPLANT FIBERSTITCH CVD - [id number] Anchor IMPLANT FIBERSTITCH CVD  [employer] [id number] Left 1 Implanted  ANCHOR PUSHLOCK PEEK 3.5X19.5 - [id number] Anchor ANCHOR PUSHLOCK PEEK 3.5X19.5  [employer] [id number] Left 2 Implanted    Surgeons: Surgeon(s): Vanetta Mulders, MD  Anesthesia: Regional    Estimated Blood Loss: See anesthesia record  Complications: None  Condition to PACU: Stable  Yevonne Pax, MD 11/10/2022 2:56 PM

## 2022-11-10 NOTE — Transfer of Care (Signed)
Immediate Anesthesia Transfer of Care Note  Patient: Rose Bell  Procedure(s) Performed: LEFT SHOULDER ARTHROSCOPY WITH ROTATOR CUFF REPAIR (Left: Shoulder)  Patient Location: PACU  Anesthesia Type:GA combined with regional for post-op pain  Level of Consciousness: drowsy and patient cooperative  Airway & Oxygen Therapy: Patient Spontanous Breathing and Patient connected to nasal cannula oxygen  Post-op Assessment: Report given to RN and Post -op Vital signs reviewed and stable  Post vital signs: Reviewed and stable  Last Vitals:  Vitals Value Taken Time  BP 139/88 11/10/22 1513  Temp    Pulse 92 11/10/22 1514  Resp 12 11/10/22 1514  SpO2 100 % 11/10/22 1514  Vitals shown include unvalidated device data.  Last Pain:  Vitals:   11/10/22 1208  TempSrc:   PainSc: 1          Complications: No notable events documented.

## 2022-11-10 NOTE — Discharge Instructions (Signed)
Discharge Instructions    Attending Surgeon: Vanetta Mulders, MD Office Phone Number: [phone]   Diagnosis and Procedures:    Surgeries Performed: Left shoulder rotator cuff repair  Discharge Plan:    Diet: Resume usual diet. Begin with light or bland foods.  Drink plenty of fluids.  Activity:  Keep sling and dressing in place until your follow up visit in Physical Therapy You are advised to go home directly from the hospital or surgical center. Restrict your activities.  GENERAL INSTRUCTIONS: 1.  Keep your surgical site elevated above your heart for at least 5-7 days or longer to prevent swelling. This will improve your comfort and your overall recovery following surgery.     2. Please call Dr. Eddie Dibbles office at [phone] with questions Monday-Friday during business hours. If no one answers, please leave a message and someone should get back to the patient within 24 hours. For emergencies please call 911 or proceed to the emergency room.   3. Patient to notify surgical team if experiences any of the following: Bowel/Bladder dysfunction, uncontrolled pain, nerve/muscle weakness, incision with increased drainage or redness, nausea/vomiting and Fever greater than 101.0 F.  Be alert for signs of infection including redness, streaking, odor, fever or chills. Be alert for excessive pain or bleeding and notify your surgeon immediately.  WOUND INSTRUCTIONS:   Leave your dressing/cast/splint in place until your post operative visit.  Keep it clean and dry.  Always keep the incision clean and dry until the staples/sutures are removed. If there is no drainage from the incision you should keep it open to air. If there is drainage from the incision you must keep it covered at all times until the drainage stops  Do not soak in a bath tub, hot tub, pool, lake or other body of water until 21 days after your surgery and your incision is completely dry and healed.  If you have  removable sutures (or staples) they must be removed 10-14 days (unless otherwise instructed) from the day of your surgery.     1)  Elevate the extremity as much as possible.  2)  Keep the dressing clean and dry.  3)  Please call us if the dressing becomes wet or dirty.  4)  If you are experiencing worsening pain or worsening swelling, please call.     MEDICATIONS: Resume all previous home medications at the previous prescribed dose and frequency unless otherwise noted Start taking the  pain medications on an as-needed basis as prescribed  Please taper down pain medication over the next week following surgery.  Ideally you should not require a refill of any narcotic pain medication.  Take pain medication with food to minimize nausea. In addition to the prescribed pain medication, you may take over-the-counter pain relievers such as Tylenol.  Do NOT take additional tylenol if your pain medication already has tylenol in it.  Aspirin '325mg'$  daily for four weeks.      FOLLOWUP INSTRUCTIONS: 1. Follow up at the Physical Therapy Clinic 3-4 days following surgery. This appointment should be scheduled unless other arrangements have been made.The Physical Therapy scheduling number is [phone] if an appointment has not already been arranged.  2. Contact Dr. Eddie Dibbles office during office hours at [phone] or the practice after hours line at [phone] for non-emergencies. For medical emergencies call 911.   Discharge Location: Home

## 2022-11-10 NOTE — Op Note (Signed)
Date of Surgery: 11/10/2022  INDICATIONS: Ms. Rose Bell is a 42 y.o.-year-old female with left shoulder full thickness rotator cuff tear traumatically.  The risk and benefits of the procedure were discussed in detail and documented in the pre-operative evaluation.   PREOPERATIVE DIAGNOSES: Left shoulder, acute atraumatic rotator cuff tear.  POSTOPERATIVE DIAGNOSIS: Same.  PROCEDURE: Arthroscopic extensive debridement - 29823 Subdeltoid Bursa, Supraspinatus Tendon, and Anterior Labrum Arthroscopic subacromial decompression - 28786 Arthroscopic rotator cuff repair - 76720  SURGEON: Yevonne Pax MD  ASSISTANT: Raynelle Fanning, ATC  ANESTHESIA:  general plus interscalene nerve block  IV FLUIDS AND URINE: See anesthesia record.  ANTIBIOTICS: Ancef  ESTIMATED BLOOD LOSS: 10 mL.  IMPLANTS:  Implant Name Type Inv. Item Serial No. Manufacturer Lot No. LRB No. Used Action  TISSUE ARTHROFLEX THICK 4 - [id number] Tissue TISSUE ARTHROFLEX THICK 4 [id number] [employer]  Left 1 Implanted  ANCHOR SUT JK SZ 2 2.9 DBL SL - [id number] Anchor ANCHOR SUT JK SZ 2 2.9 DBL SL  [employer](ORTH,TRAU,BIO,SG) [id number] Left 1 Implanted  ANCHOR JUGGERKNOT TRPL 2 2.9 - [id number] Anchor ANCHOR JUGGERKNOT TRPL 2 2.9  [employer](ORTH,TRAU,BIO,SG) [id number] Left 1 Implanted  ANCHOR SUT QUATTRO KNTLS 4.5 - [id number] Anchor ANCHOR SUT QUATTRO KNTLS 4.5  [employer](ORTH,TRAU,BIO,SG) [id number] Left 2 Implanted  IMPL FIBERSTITCH STRT RC 2-0 - [id number] Anchor IMPL FIBERSTITCH STRT RC 2-0  [employer] [id number] Left 2 Implanted  IMPLANT FIBERSTITCH CVD - [id number] Anchor IMPLANT FIBERSTITCH CVD  [employer] [id number] Left 1 Implanted  IMPLANT FIBERSTITCH CVD - [id number] Anchor IMPLANT FIBERSTITCH CVD  [employer] [id number] Left 1 Implanted  ANCHOR PUSHLOCK PEEK 3.5X19.5 - [id number] Anchor ANCHOR PUSHLOCK PEEK 3.5X19.5  [employer] [id number] Left 2 Implanted    DRAINS: None  CULTURES: None  COMPLICATIONS:  none  PROCEDURE:    OPERATIVE FINDING: Exam under anesthesia:   Examination under anesthesia revealed forward elevation of 150 degrees.  With the arm at the side, there was 65 degrees of external rotation.  There is a 1+ anterior load shift and a 1+ posterior load shift.    Arthroscopic findings demonstrated: Articular space: Normal Chondral surfaces: Normal Biceps: Intact Subscapularis: Intact Supraspinatus: Full-thickness tear Infraspinatus: Intact    I identified the patient in the pre-operative holding area.  I marked the operative right shoulder with my initials. I reviewed the risks and benefits of the proposed surgical intervention and the patient wished to proceed.  Anesthesia was then performed with regional block.  The patient was transferred to the operative suite and placed in the beach chair position with all bony prominences padded.     SCDs were placed on bilateral lower extremity. Appropriate antibiotics was administered within 1 hour before incision.  Anesthesia was induced.  The operative extremity was then prepped and draped in standard fashion. A time out was performed confirming the correct extremity, correct patient and correct procedure.   The arthroscope was introduced in the glenohumeral joint from a posterior portal.  An anterior portal was created.  The shoulder was examined and the above findings were noted.     With an arthroscopic shaver and a wand ablator, synovitis throughout the  shoulder was resected.  The arthroscopic shaver was used to excise torn portions of the labrum back to a stable margin. Specifically this was done for the anterior labrum as well as the subdeltoid bursa and frayed supraspinatus margin.   Through visualization from intra-articular, the footprint of the rotator cuff was debrided of soft tissues back  to bleeding bone.  This was done with an arthroscopic shaver.     The rotator cuff was then approached through the subacromial space.  Anterior, lateral, and posterior portals were used.  Bursectomy was performed with an arthroscopic shaver and ArthroCare wand.  The soft tissues on the undersurface of the acromion and clavicle were resected with the arthroscopic shaver and wand. There was good excursion that was noted of the tendon back to its footprint which would be amendable to an repair.   The rotator cuff was repaired with a double row configuration with two medial row all suture suture anchors as noted above with sutures passed from anterior to posterior in horizontal fashion with a self passing suture device.  A total of 6 limbs of suture were passed.  The sutures were placed into two  lateral row anchors.  This provided excellent anatomic footprint approximation.   The shoulder was irrigated.  The arthroscopic instruments were removed.  Wounds were closed with 3-0 nylon sutures.  A sterile dressing was applied with xeroform, 4x8s, abdominal pad, and tape. An Gar Gibbon was placed and the upper extremity was placed in a shoulder immobilizer.  The patient tolerated the procedure well and was taken to the recovery room in stable condition.  All counts were correct in the case. The patient tolerated the procedure well and was taken to the recovery room in stable condition.    POSTOPERATIVE PLAN: She will be nonweightbearing in a sling on the left arm.  She will see physical therapy and begin range of motion.  She will work to the rotator cuff protocol.  Yevonne Pax, MD 2:56 PM

## 2022-11-10 NOTE — Anesthesia Postprocedure Evaluation (Signed)
Anesthesia Post Note  Patient: Rose Bell  Procedure(s) Performed: LEFT SHOULDER ARTHROSCOPY WITH ROTATOR CUFF REPAIR (Left: Shoulder)     Patient location during evaluation: PACU Anesthesia Type: Regional and General Level of consciousness: awake and alert, oriented and patient cooperative Pain management: pain level controlled Vital Signs Assessment: post-procedure vital signs reviewed and stable Respiratory status: spontaneous breathing, nonlabored ventilation and respiratory function stable Cardiovascular status: blood pressure returned to baseline and stable Postop Assessment: no apparent nausea or vomiting Anesthetic complications: no   No notable events documented.  Last Vitals:  Vitals:   11/10/22 1530 11/10/22 1545  BP: (!) 140/76 135/78  Pulse: 90 88  Resp: 19 17  Temp:  36.7 C  SpO2: 95% 95%    Last Pain:  Vitals:   11/10/22 1545  TempSrc:   PainSc: 0-No pain                 Pervis Hocking

## 2022-11-10 NOTE — Anesthesia Procedure Notes (Signed)
Anesthesia Regional Block: Interscalene brachial plexus block   Pre-Anesthetic Checklist: , timeout performed,  Correct Patient, Correct Site, Correct Laterality,  Correct Procedure, Correct Position, site marked,  Risks and benefits discussed,  Surgical consent,  Pre-op evaluation,  At surgeon's request and post-op pain management  Laterality: Left  Prep: Maximum Sterile Barrier Precautions used, chloraprep       Needles:  Injection technique: Single-shot  Needle Type: Echogenic Stimulator Needle     Needle Length: 9cm  Needle Gauge: 22     Additional Needles:   Procedures:,,,, ultrasound used (permanent image in chart),,    Narrative:  Start time: 11/10/2022 12:20 PM End time: 11/10/2022 12:25 PM Injection made incrementally with aspirations every 5 mL.  Performed by: Personally  Anesthesiologist: Pervis Hocking, DO  Additional Notes: Monitors applied. No increased pain on injection. No increased resistance to injection. Injection made in 5cc increments. Good needle visualization. Patient tolerated procedure well.

## 2022-11-10 NOTE — Anesthesia Preprocedure Evaluation (Signed)
Anesthesia Evaluation    Reviewed: Allergy & Precautions, Patient's Chart, lab work & pertinent test results  Airway Mallampati: II  TM Distance: >3 FB Neck ROM: Full    Dental no notable dental hx.    Pulmonary neg pulmonary ROS   Pulmonary exam normal breath sounds clear to auscultation       Cardiovascular hypertension (144/81, no home meds), Normal cardiovascular exam Rhythm:Regular Rate:Normal     Neuro/Psych  Headaches PSYCHIATRIC DISORDERS Anxiety        GI/Hepatic Neg liver ROS,GERD  Controlled,,  Endo/Other  Obesity BMI 35  Renal/GU negative Renal ROS  negative genitourinary   Musculoskeletal negative musculoskeletal ROS (+)    Abdominal   Peds  Hematology negative hematology ROS (+)   Anesthesia Other Findings   Reproductive/Obstetrics negative OB ROS                              Anesthesia Physical Anesthesia Plan  ASA: 2  Anesthesia Plan: General and Regional   Post-op Pain Management: Regional block* and Tylenol PO (pre-op)*   Induction: Intravenous  PONV Risk Score and Plan: 3 and Ondansetron, Dexamethasone, Midazolam and Treatment may vary due to age or medical condition  Airway Management Planned: Oral ETT  Additional Equipment: None  Intra-op Plan:   Post-operative Plan: Extubation in OR  Informed Consent:   Plan Discussed with:   Anesthesia Plan Comments:          Anesthesia Quick Evaluation

## 2022-11-10 NOTE — Interval H&P Note (Signed)
History and Physical Interval Note:  11/10/2022 12:04 PM  Rose Bell  has presented today for surgery, with the diagnosis of LEFT ROTATOR CUFF TEAR.  The various methods of treatment have been discussed with the patient and family. After consideration of risks, benefits and other options for treatment, the patient has consented to  Procedure(s): LEFT SHOULDER ARTHROSCOPY WITH ROTATOR CUFF REPAIR (Left) as a surgical intervention.  The patient's history has been reviewed, patient examined, no change in status, stable for surgery.  I have reviewed the patient's chart and labs.  Questions were answered to the patient's satisfaction.     Vanetta Mulders

## 2022-11-11 ENCOUNTER — Encounter (HOSPITAL_COMMUNITY): Payer: Self-pay | Admitting: Orthopaedic Surgery

## 2022-11-11 ENCOUNTER — Telehealth: Payer: Self-pay | Admitting: Orthopaedic Surgery

## 2022-11-11 NOTE — Telephone Encounter (Signed)
[place] forms received. To [employer]. 

## 2022-11-16 ENCOUNTER — Encounter (HOSPITAL_BASED_OUTPATIENT_CLINIC_OR_DEPARTMENT_OTHER): Payer: Self-pay | Admitting: Physical Therapy

## 2022-11-16 ENCOUNTER — Ambulatory Visit (HOSPITAL_BASED_OUTPATIENT_CLINIC_OR_DEPARTMENT_OTHER): Payer: Commercial Managed Care - PPO | Attending: Orthopaedic Surgery | Admitting: Physical Therapy

## 2022-11-16 ENCOUNTER — Other Ambulatory Visit: Payer: Self-pay

## 2022-11-16 ENCOUNTER — Other Ambulatory Visit (HOSPITAL_COMMUNITY): Payer: Self-pay

## 2022-11-16 DIAGNOSIS — S46012A Strain of muscle(s) and tendon(s) of the rotator cuff of left shoulder, initial encounter: Secondary | ICD-10-CM | POA: Insufficient documentation

## 2022-11-16 DIAGNOSIS — M25612 Stiffness of left shoulder, not elsewhere classified: Secondary | ICD-10-CM | POA: Diagnosis not present

## 2022-11-16 DIAGNOSIS — M6281 Muscle weakness (generalized): Secondary | ICD-10-CM | POA: Insufficient documentation

## 2022-11-16 DIAGNOSIS — E782 Mixed hyperlipidemia: Secondary | ICD-10-CM

## 2022-11-16 DIAGNOSIS — M25512 Pain in left shoulder: Secondary | ICD-10-CM | POA: Insufficient documentation

## 2022-11-16 MED ORDER — ROSUVASTATIN CALCIUM 10 MG PO TABS
10.0000 mg | ORAL_TABLET | Freq: Every day | ORAL | 0 refills | Status: DC
  Filled 2022-11-16: qty 90, 90d supply, fill #0

## 2022-11-16 NOTE — Therapy (Signed)
OUTPATIENT PHYSICAL THERAPY UPPER EXTREMITY EVALUATION   Patient Name: Rose Bell MRN: [id number] DOB:[birth year], 42 y.o., female Today's Date: 11/17/2022  END OF SESSION:  PT End of Session - 11/17/22 1125     Visit Number 1    Number of Visits 16    Date for PT Re-Evaluation 01/12/23    Authorization Type [place] [employer]    PT Start Time 1315    PT Stop Time 9563    PT Time Calculation (min) 44 min    Activity Tolerance Patient tolerated treatment well    Behavior During Therapy [facility] for tasks assessed/performed             Past Medical History:  Diagnosis Date   Hyperlipemia    Hypertension    no current problems, no meds   Pneumonia    Seasonal allergies    Tear of left rotator cuff 10/2022   Past Surgical History:  Procedure Laterality Date   ARTHOSCOPIC ROTAOR CUFF REPAIR Left 11/10/2022   Procedure: LEFT SHOULDER ARTHROSCOPY WITH ROTATOR CUFF REPAIR;  Surgeon: Vanetta Mulders, MD;  Location: [place];  Service: Orthopedics;  Laterality: Left;   BREAST BIOPSY Right    TUBAL LIGATION     Patient Active Problem List   Diagnosis Date Noted   Rotator cuff tear, left 10/22/2022   Elevated BP without diagnosis of hypertension 01/19/2022   Pneumonia due to COVID-19 virus 10/26/2019   Fracture of finger, middle phalanx, left, closed 09/26/2019   Orgasmic headache 01/08/2016   Obesity (BMI 30-39.9) 01/02/2015   Hyperlipidemia 08/10/2014   GERD (gastroesophageal reflux disease) 03/04/2012   General medical examination 06/26/2011   Screening for malignant neoplasm of the cervix 06/26/2011   Vitamin D deficiency 05/30/2009   Anxiety state 05/30/2009   B12 deficiency 05/23/2009    PCP: Dr Annye Asa MD    REFERRING PROVIDER: Dr Vanetta Mulders   REFERRING DIAG: [id number] (ICD-10-CM) - Traumatic complete tear of left rotator cuff, initial encounter   THERAPY DIAG:  Stiffness of left shoulder, not elsewhere classified  Acute pain of left  shoulder  Muscle weakness (generalized)  Rationale for Evaluation and Treatment: Rehabilitation  ONSET DATE: 11/10/2022 R RTC repair  SAD and extensive debridement   Days since surgery: 6   SUBJECTIVE:                                                                                                                                                                                      SUBJECTIVE STATEMENT: Patient suffered a fall on New Year's Day and sustained a left rotator cuff tear.  She had a rotator cuff repaired on 11/10/2022.  At this time she is compliant with sling use.  Her pain is well-controlled.  PERTINENT HISTORY: HTN,, RTC tear  PAIN:  Are you having pain? Yes: NPRS scale: 1/10 now at worst 3/10  Pain location: left anterior shoulder  Pain description: aching  Aggravating factors: use of the shoulder  Relieving factors: Adjusting her movements   PRECAUTIONS: Shoulder  WEIGHT BEARING RESTRICTIONS: Yes NWB  FALLS:  Has patient fallen in last 6 months? Yes. Number of falls 1  LIVING ENVIRONMENT: Nothing pertinent   OCCUPATION: Works for [employer]; Works at a Teaching laboratory technician.   Hobbies: exercise    PLOF: Independent  PATIENT GOALS:  To have use of her arm    To be able to use her left arm   NEXT MD VISIT:   OBJECTIVE:   DIAGNOSTIC FINDINGS:  Noting post-op   PATIENT SURVEYS :  FOTO    COGNITION: Overall cognitive status: Within functional limits for tasks assessed     SENSATION: Mild tingling in the hand  POSTURE:   UPPER EXTREMITY ROM:   Passive ROM Right eval Left eval  Shoulder flexion  58  Shoulder extension    Shoulder abduction  45  Shoulder adduction    Shoulder internal rotation    Shoulder external rotation  5   Elbow flexion    Elbow extension    Wrist flexion    Wrist extension    Wrist ulnar deviation    Wrist radial deviation    Wrist pronation    Wrist supination    (Blank rows = not tested)  UPPER EXTREMITY MMT:  MMT  Right eval Left eval  Shoulder flexion    Shoulder extension    Shoulder abduction    Shoulder adduction    Shoulder internal rotation    Shoulder external rotation    Middle trapezius    Lower trapezius    Elbow flexion    Elbow extension    Wrist flexion    Wrist extension    Wrist ulnar deviation    Wrist radial deviation    Wrist pronation    Wrist supination    Grip strength (lbs)    (Blank rows = not tested)   JOINT MOBILITY TESTING:    PALPATION:  No unexpected TTP    TODAY'S TREATMENT:                                                                                                                                         DATE:   PATIENT EDUCATION: Education details: HPE, symptom management, progression of activity  Person educated: Patient Education method: Explanation, Demonstration, Tactile cues, Verbal cues, and Handouts Education comprehension: verbalized understanding, returned demonstration, verbal cues required, tactile cues required, and needs further education  HOME EXERCISE PROGRAM: Access Code: [email or web address] URL: [email or web address] Date: 11/17/2022 Prepared by: Carolyne Littles  Exercises - Seated Scapular Retraction  - 1 x daily -  7 x weekly - 3 sets - 10 reps - Circular Shoulder Pendulum with Table Support  - 1 x daily - 7 x weekly - 3 sets - 10 reps - Supported Elbow Flexion Extension PROM  - 1 x daily - 7 x weekly - 3 sets - 10 reps  ASSESSMENT:  CLINICAL IMPRESSION: Patient is a 42 year old female status post left rotator cuff repair with extensive debridement.  She presents with expected limitations in motion, strength and function.  She is currently compliant with her sling use.  Range of motion is where we would expected to be at this time.  Will continue to progress per rotator cuff protocol.  OBJECTIVE IMPAIRMENTS: decreased activity tolerance, decreased mobility, difficulty walking, decreased ROM, decreased strength, impaired  UE functional use, and pain.   ACTIVITY LIMITATIONS: sleeping, bathing, dressing, self feeding, reach over head, and hygiene/grooming  PARTICIPATION LIMITATIONS: meal prep, cleaning, laundry, driving, shopping, community activity, occupation, and yard work  PERSONAL FACTORS: None   REHAB POTENTIAL: Excellent  CLINICAL DECISION MAKING: Stable/uncomplicated  EVALUATION COMPLEXITY: Low  GOALS: Goals reviewed with patient? Yes  SHORT TERM GOALS: Target date: 12/15/2022    Patient will increase passive L external rotation to 90 degrees Baseline: Goal status: INITIAL  2.  Patient will increase left shoulder passive flexion to 120 degrees Baseline:  Goal status: INITIAL  3.  Patient will be independent with basic exercise program Baseline:  Goal status: INITIAL  LONG TERM GOALS: Target date: 01/11/2023    Patient will reach to a shelf overhead without increased left shoulder pain Baseline:  Goal status: INITIAL  2.  Patient will be able to reach behind her head without increased pain in order to perform grooming tasks Baseline:  Goal status: INITIAL  3.  Patient will be independent with full exercise program including light gym activity Baseline:  Goal status: INITIAL  4.  Patient will reach behind her back to T12 without increased left shoulder pain in order to perform ADLs Baseline:  Goal status: INITIAL PLAN: PT FREQUENCY: 2x/week  PT DURATION: 12 weeks  PLANNED INTERVENTIONS: Therapeutic exercises, Therapeutic activity, Neuromuscular re-education, Patient/Family education, Self Care, Joint mobilization, Aquatic Therapy, Dry Needling, Electrical stimulation, Cryotherapy, Moist heat, Taping, Ultrasound, and Manual therapy  PLAN FOR NEXT SESSION: Begin per rotator cuff protocol   Carney Living, PT 11/17/2022, 11:27 AM

## 2022-11-17 ENCOUNTER — Encounter (HOSPITAL_BASED_OUTPATIENT_CLINIC_OR_DEPARTMENT_OTHER): Payer: Self-pay | Admitting: Physical Therapy

## 2022-11-17 ENCOUNTER — Other Ambulatory Visit: Payer: Self-pay

## 2022-11-25 ENCOUNTER — Ambulatory Visit (HOSPITAL_BASED_OUTPATIENT_CLINIC_OR_DEPARTMENT_OTHER): Payer: Commercial Managed Care - PPO | Attending: Orthopaedic Surgery | Admitting: Physical Therapy

## 2022-11-25 ENCOUNTER — Ambulatory Visit (INDEPENDENT_AMBULATORY_CARE_PROVIDER_SITE_OTHER): Payer: Commercial Managed Care - PPO | Admitting: Orthopaedic Surgery

## 2022-11-25 DIAGNOSIS — M25512 Pain in left shoulder: Secondary | ICD-10-CM | POA: Insufficient documentation

## 2022-11-25 DIAGNOSIS — M25612 Stiffness of left shoulder, not elsewhere classified: Secondary | ICD-10-CM | POA: Insufficient documentation

## 2022-11-25 DIAGNOSIS — M6281 Muscle weakness (generalized): Secondary | ICD-10-CM | POA: Diagnosis not present

## 2022-11-25 DIAGNOSIS — S46012A Strain of muscle(s) and tendon(s) of the rotator cuff of left shoulder, initial encounter: Secondary | ICD-10-CM

## 2022-11-25 NOTE — Progress Notes (Signed)
Post Operative Evaluation    Procedure/Date of Surgery: Left shoulder rotator cuff repair 1/23  Interval History:   Presents today status post left shoulder rotator cuff repair overall doing very well.  She is working with physical therapy.  She is working on range of motion.  Overall she does not have any pain.  She has been back to work.   PMH/PSH/Family History/Social History/Meds/Allergies:    Past Medical History:  Diagnosis Date   Hyperlipemia    Hypertension    no current problems, no meds   Pneumonia    Seasonal allergies    Tear of left rotator cuff 10/2022   Past Surgical History:  Procedure Laterality Date   ARTHOSCOPIC ROTAOR CUFF REPAIR Left 11/10/2022   Procedure: LEFT SHOULDER ARTHROSCOPY WITH ROTATOR CUFF REPAIR;  Surgeon: Vanetta Mulders, MD;  Location: [place];  Service: Orthopedics;  Laterality: Left;   BREAST BIOPSY Right    TUBAL LIGATION     Social History   Socioeconomic History   Marital status: Married    Spouse name: Not on file   Number of children: Not on file   Years of education: Not on file   Highest education level: Not on file  Occupational History   Not on file  Tobacco Use   Smoking status: Never   Smokeless tobacco: Never  Vaping Use   Vaping Use: Never used  Substance and Sexual Activity   Alcohol use: Yes    Comment: socially   Drug use: No   Sexual activity: Yes    Birth control/protection: Surgical    Comment: Tubal Ligation  Other Topics Concern   Not on file  Social History Narrative   Not on file   Social Determinants of Health   Financial Resource Strain: Not on file  Food Insecurity: Not on file  Transportation Needs: Not on file  Physical Activity: Not on file  Stress: Not on file  Social Connections: Not on file   Family History  Problem Relation Age of Onset   Diabetes Mother    Hypertension Mother    Hypothyroidism Mother    Fibroids Mother    Heart disease Father         cardiomyopathy   Other Father        Cardio Megaly   Heart disease Paternal Uncle    Diabetes Other        father side of family   Breast cancer Neg Hx    Allergies  Allergen Reactions   Phentermine Swelling    After 3 days pt says tongue swells and was having trouble with speech   Buspirone Swelling   Bupropion Other (See Comments)    Unknown reaction    Statins Anxiety   Current Outpatient Medications  Medication Sig Dispense Refill   aspirin EC 325 MG tablet Take 1 tablet (325 mg total) by mouth daily. 30 tablet 0   Biotin 5000 MCG TABS Take 5,000 mcg by mouth daily.     cyanocobalamin 2000 MCG tablet Take 2,000 mcg by mouth daily.     loratadine (CLARITIN) 10 MG tablet Take 10 mg by mouth daily.     oxyCODONE (OXY IR/ROXICODONE) 5 MG immediate release tablet Take 1 tablet (5 mg total) by mouth every 4 (four) hours as needed (severe pain). 20 tablet 0  rosuvastatin (CRESTOR) 10 MG tablet Take 1 tablet (10 mg total) by mouth daily. 90 tablet 0   No current facility-administered medications for this visit.   No results found.  Review of Systems:   A ROS was performed including pertinent positives and negatives as documented in the HPI.   Musculoskeletal Exam:    Left shoulder range of motion in the supine position is to 90 degrees of flexion with 30 degrees external rotation.  Internal rotation deferred today incisions are clean dry and intact.  Distal neurovascular exam intact  Imaging:      I personally reviewed and interpreted the radiographs.   Assessment:   2 weeks status post left shoulder rotator cuff repair overall doing extremely well.  At today's visit she has already met her passive range of motion goals.  I would like her to continue to work through the rotator cuff repair protocol.  I will plan to see her back in 4 weeks for reassessment  Plan :    -Return to clinic in 4 weeks for reassessment      I personally saw and evaluated the  patient, and participated in the management and treatment plan.  Vanetta Mulders, MD Attending Physician, Orthopedic Surgery  This document was dictated using Dragon voice recognition software. A reasonable attempt at proof reading has been made to minimize errors.

## 2022-11-25 NOTE — Therapy (Signed)
OUTPATIENT PHYSICAL THERAPY TREATMENT NOTE   Patient Name: Rose Bell MRN: [id number] DOB:11-09-1980, 42 y.o., female Today's Date: 11/26/2022  END OF SESSION:  PT End of Session - 11/26/22 1139     Visit Number 2    Number of Visits 16    Date for PT Re-Evaluation 01/12/23    Authorization Type [place] [employer]    PT Start Time 1303    PT Stop Time 1343    PT Time Calculation (min) 40 min    Activity Tolerance Patient tolerated treatment well    Behavior During Therapy [facility] for tasks assessed/performed              Past Medical History:  Diagnosis Date   Hyperlipemia    Hypertension    no current problems, no meds   Pneumonia    Seasonal allergies    Tear of left rotator cuff 10/2022   Past Surgical History:  Procedure Laterality Date   ARTHOSCOPIC ROTAOR CUFF REPAIR Left 11/10/2022   Procedure: LEFT SHOULDER ARTHROSCOPY WITH ROTATOR CUFF REPAIR;  Surgeon: Vanetta Mulders, MD;  Location: [place];  Service: Orthopedics;  Laterality: Left;   BREAST BIOPSY Right    TUBAL LIGATION     Patient Active Problem List   Diagnosis Date Noted   Rotator cuff tear, left 10/22/2022   Elevated BP without diagnosis of hypertension 01/19/2022   Pneumonia due to COVID-19 virus 10/26/2019   Fracture of finger, middle phalanx, left, closed 09/26/2019   Orgasmic headache 01/08/2016   Obesity (BMI 30-39.9) 01/02/2015   Hyperlipidemia 08/10/2014   GERD (gastroesophageal reflux disease) 03/04/2012   General medical examination 06/26/2011   Screening for malignant neoplasm of the cervix 06/26/2011   Vitamin D deficiency 05/30/2009   Anxiety state 05/30/2009   B12 deficiency 05/23/2009    PCP: Dr Annye Asa MD    REFERRING PROVIDER: Dr Vanetta Mulders   REFERRING DIAG: [phone] (ICD-10-CM) - Traumatic complete tear of left rotator cuff, initial encounter   THERAPY DIAG:  Stiffness of left shoulder, not elsewhere classified  Acute pain of left shoulder  Muscle  weakness (generalized)  Rationale for Evaluation and Treatment: Rehabilitation  ONSET DATE: 11/10/2022 R RTC repair  SAD and extensive debridement   Days since surgery: 15   SUBJECTIVE:                                                                                                                                                                                      SUBJECTIVE STATEMENT: Pt is 2 weeks and 1 day s/p RCR.  Pt returned to work yesterday and worked a full day.  Pt states her scapula  became irritated and uncomfortable toward the end of the day.  Pt used the [employer] after work which helped.  Pt denies any adverse effects after prior Rx.  Pt is compliant with sling usage.  Pt reports compliance with HEP and had no increased pain with HEP.      PERTINENT HISTORY: HTN,, RTC tear  PAIN:  NPRS scale:  0/10 currently, at worst 3/10  Pain location: left anterior shoulder  Pain description: aching  Aggravating factors: use of the shoulder  Relieving factors: Adjusting her movements   PRECAUTIONS: Shoulder  WEIGHT BEARING RESTRICTIONS: Yes NWB  FALLS:  Has patient fallen in last 6 months? Yes. Number of falls 1  LIVING ENVIRONMENT: Nothing pertinent   OCCUPATION: Works for [employer]; Works at a Teaching laboratory technician.   Hobbies: exercise    PLOF: Independent  PATIENT GOALS:  To have use of her arm    To be able to use her left arm   NEXT MD VISIT:   OBJECTIVE:   DIAGNOSTIC FINDINGS:  Noting post-op     TODAY'S TREATMENT:                                                                                                                                          Therapeutic Exercise: -Reviewed pain level, response to prior Rx, and HEP compliance. -Reviewed HEP. -Pt performed:  Seated scapular retraction with arms supported x 10 reps  Pendulums  Elbow flex/ext AAROM   Wrist flex and extension AROM with arm supported x 20 rep each  Towel gripping 2x10 reps with arm  supported   PT updated HEP and gave pt a HEP handout.  Educated pt in correct form, correct positioning, and appropriate frequency.   Manual Therapy:  Gentle distraction with oscillation and grade I-II AP and inf jt mobs to L GH joint to improve pain, stiffness, and to normalize arthrokinematics Pt received L shoulder flexion, scaption, and ER PROM per pt and tissue tolerance within protocol limits.     PATIENT EDUCATION: Education details: HEP, surgical protocol limitations, exercise form, and POC.  PT answered pt's questions.  Instruction in icing.   Person educated: Patient Education method: Explanation, Demonstration, Tactile cues, Verbal cues, and Handouts Education comprehension: verbalized understanding, returned demonstration, verbal cues required, tactile cues required, and needs further education  HOME EXERCISE PROGRAM: Access Code: [email or web address] URL: [email or web address] Date: 11/17/2022 Prepared by: Carolyne Littles  Exercises - Seated Scapular Retraction  - 1 x daily - 7 x weekly - 3 sets - 10 reps - Circular Shoulder Pendulum with Table Support  - 1 x daily - 7 x weekly - 3 sets - 10 reps - Supported Elbow Flexion Extension PROM  - 1 x daily - 7 x weekly - 3 sets - 10 reps  Updated HEP: - Wrist Extension AROM  - 2-3 x daily - 7 x weekly - 2-3 sets - 10  reps - Wrist Flexion AROM  - 2-3 x daily - 7 x weekly - 2-3 sets - 10 reps - Seated Gripping Towel  - 2-3 x daily - 7 x weekly - 2 sets - 10 reps  ASSESSMENT:  CLINICAL IMPRESSION: Pt is compliant with wearing sling and performing HEP.  PT reviewed and updated HEP.  Pt performed HEP with cuing for correct form and positioning.  She required instruction for correct form with pendulums.  Pt demonstrates good understanding of HEP.  PT performed PROM w/n protocol limits and pt tolerated PROM well overall.  She did require cuing to relax L UE with PROM.  She demonstrates improved PROM based on visual observation.   Pt responded well to Rx having no pain after Rx.  Pt should benefit from cont skilled PT services per protocol to address goals, improve ROM, and improve function.   OBJECTIVE IMPAIRMENTS: decreased activity tolerance, decreased mobility, difficulty walking, decreased ROM, decreased strength, impaired UE functional use, and pain.   ACTIVITY LIMITATIONS: sleeping, bathing, dressing, self feeding, reach over head, and hygiene/grooming  PARTICIPATION LIMITATIONS: meal prep, cleaning, laundry, driving, shopping, community activity, occupation, and yard work  PERSONAL FACTORS: None   REHAB POTENTIAL: Excellent  CLINICAL DECISION MAKING: Stable/uncomplicated  EVALUATION COMPLEXITY: Low  GOALS: Goals reviewed with patient? Yes  SHORT TERM GOALS: Target date: 12/15/2022    Patient will increase passive L external rotation to 90 degrees Baseline: Goal status: INITIAL  2.  Patient will increase left shoulder passive flexion to 120 degrees Baseline:  Goal status: INITIAL  3.  Patient will be independent with basic exercise program Baseline:  Goal status: INITIAL  LONG TERM GOALS: Target date: 01/11/2023    Patient will reach to a shelf overhead without increased left shoulder pain Baseline:  Goal status: INITIAL  2.  Patient will be able to reach behind her head without increased pain in order to perform grooming tasks Baseline:  Goal status: INITIAL  3.  Patient will be independent with full exercise program including light gym activity Baseline:  Goal status: INITIAL  4.  Patient will reach behind her back to T12 without increased left shoulder pain in order to perform ADLs Baseline:  Goal status: INITIAL PLAN: PT FREQUENCY: 2x/week  PT DURATION: 12 weeks  PLANNED INTERVENTIONS: Therapeutic exercises, Therapeutic activity, Neuromuscular re-education, Patient/Family education, Self Care, Joint mobilization, Aquatic Therapy, Dry Needling, Electrical stimulation,  Cryotherapy, Moist heat, Taping, Ultrasound, and Manual therapy  PLAN FOR NEXT SESSION: Cont per Dr. Nicki Guadalajara rotator cuff repair protocol   Selinda Michaels III PT, DPT 11/26/22 2:56 PM

## 2022-11-26 ENCOUNTER — Encounter (HOSPITAL_BASED_OUTPATIENT_CLINIC_OR_DEPARTMENT_OTHER): Payer: Self-pay | Admitting: Physical Therapy

## 2022-12-01 ENCOUNTER — Ambulatory Visit (HOSPITAL_BASED_OUTPATIENT_CLINIC_OR_DEPARTMENT_OTHER): Payer: Commercial Managed Care - PPO | Admitting: Physical Therapy

## 2022-12-01 DIAGNOSIS — M25612 Stiffness of left shoulder, not elsewhere classified: Secondary | ICD-10-CM | POA: Diagnosis not present

## 2022-12-01 DIAGNOSIS — M6281 Muscle weakness (generalized): Secondary | ICD-10-CM | POA: Diagnosis not present

## 2022-12-01 DIAGNOSIS — M25512 Pain in left shoulder: Secondary | ICD-10-CM | POA: Diagnosis not present

## 2022-12-01 NOTE — Therapy (Signed)
OUTPATIENT PHYSICAL THERAPY TREATMENT NOTE   Patient Name: Rose Bell MRN: [id number] DOB:Apr 29, 1981, 42 y.o., female Today's Date: 12/02/2022  END OF SESSION:  PT End of Session - 12/01/22 1434     Visit Number 3    Number of Visits 16    Date for PT Re-Evaluation 01/12/23    Authorization Type [place] [employer]    PT Start Time 1155    PT Stop Time 1230    PT Time Calculation (min) 35 min    Activity Tolerance Patient tolerated treatment well    Behavior During Therapy [facility] for tasks assessed/performed               Past Medical History:  Diagnosis Date   Hyperlipemia    Hypertension    no current problems, no meds   Pneumonia    Seasonal allergies    Tear of left rotator cuff 10/2022   Past Surgical History:  Procedure Laterality Date   ARTHOSCOPIC ROTAOR CUFF REPAIR Left 11/10/2022   Procedure: LEFT SHOULDER ARTHROSCOPY WITH ROTATOR CUFF REPAIR;  Surgeon: Vanetta Mulders, MD;  Location: [place];  Service: Orthopedics;  Laterality: Left;   BREAST BIOPSY Right    TUBAL LIGATION     Patient Active Problem List   Diagnosis Date Noted   Rotator cuff tear, left 10/22/2022   Elevated BP without diagnosis of hypertension 01/19/2022   Pneumonia due to COVID-19 virus 10/26/2019   Fracture of finger, middle phalanx, left, closed 09/26/2019   Orgasmic headache 01/08/2016   Obesity (BMI 30-39.9) 01/02/2015   Hyperlipidemia 08/10/2014   GERD (gastroesophageal reflux disease) 03/04/2012   General medical examination 06/26/2011   Screening for malignant neoplasm of the cervix 06/26/2011   Vitamin D deficiency 05/30/2009   Anxiety state 05/30/2009   B12 deficiency 05/23/2009    PCP: Dr Annye Asa MD    REFERRING PROVIDER: Dr Vanetta Mulders   REFERRING DIAG: [id number] (ICD-10-CM) - Traumatic complete tear of left rotator cuff, initial encounter   THERAPY DIAG:  Stiffness of left shoulder, not elsewhere classified  Acute pain of left shoulder  Muscle  weakness (generalized)  Rationale for Evaluation and Treatment: Rehabilitation  ONSET DATE: 11/10/2022 R RTC repair  SAD and extensive debridement   Days since surgery: 21   SUBJECTIVE:                                                                                                                                                                                      SUBJECTIVE STATEMENT: Pt is 3 weeks s/p RCR.  Pt saw MD last week and had her stitches removed and steri strips applied.  Pt returns  to MD in 4 weeks.  Pt reports she was sore after prior Rx though had no increased pain.  She used ice which reduced soreness.  Pt is compliant with sling usage.  Pt reports compliance with HEP and had no increased pain with HEP.  Pt is not having the irritation in her scapula at work that she was having.     PERTINENT HISTORY: HTN,, RTC tear  PAIN:  NPRS scale:  0/10 currently, at worst 3/10  Pain location: left anterior shoulder  Pain description: aching  Aggravating factors: use of the shoulder  Relieving factors: Adjusting her movements   PRECAUTIONS: Shoulder  WEIGHT BEARING RESTRICTIONS: Yes NWB  FALLS:  Has patient fallen in last 6 months? Yes. Number of falls 1  LIVING ENVIRONMENT: Nothing pertinent   OCCUPATION: Works for [employer]; Works at a Teaching laboratory technician.   Hobbies: exercise    PLOF: Independent  PATIENT GOALS:  To have use of her arm    To be able to use her left arm   NEXT MD VISIT:   OBJECTIVE:   DIAGNOSTIC FINDINGS:  Noting post-op     TODAY'S TREATMENT:                                                                                                                                           Observation: 3 steri strips over portals without any sings of infection.  Pt had no drainage.   Therapeutic Exercise: -Reviewed pain level, response to prior Rx, and HEP compliance. -Reviewed HEP. -Pt performed:  Seated scapular retraction with arms supported x 10  reps  Pendulums cw and ccw 2x10 each  Wrist flex and extension AROM with arm supported x 20 rep each  Yellow putty gripping 2x10 reps with arm supported   Manual Therapy:  Gentle distraction with oscillation and grade I-II AP and inf jt mobs to L GH joint to improve pain, stiffness, and to normalize arthrokinematics Pt received L shoulder flexion, abd, and ER PROM per pt and tissue tolerance within protocol limits.  L shoulder PROM: Flexion:  131 deg,  ER:  31 deg     PATIENT EDUCATION: Education details: HEP, surgical protocol limitations, ROM findings and progress, exercise form, and POC.  PT answered pt's questions.  Instruction in icing.   Person educated: Patient Education method: Explanation, Demonstration, Tactile cues, Verbal cues, and Handouts Education comprehension: verbalized understanding, returned demonstration, verbal cues required, tactile cues required, and needs further education  HOME EXERCISE PROGRAM: Access Code: [email or web address] URL: [email or web address] Date: 11/17/2022 Prepared by: Carolyne Littles  Exercises - Seated Scapular Retraction  - 1 x daily - 7 x weekly - 3 sets - 10 reps - Circular Shoulder Pendulum with Table Support  - 1 x daily - 7 x weekly - 3 sets - 10 reps - Supported Elbow Flexion Extension PROM  - 1 x daily - 7 x weekly -  3 sets - 10 reps  Updated HEP: - Wrist Extension AROM  - 2-3 x daily - 7 x weekly - 2-3 sets - 10 reps - Wrist Flexion AROM  - 2-3 x daily - 7 x weekly - 2-3 sets - 10 reps - Seated Gripping Towel  - 2-3 x daily - 7 x weekly - 2 sets - 10 reps  ASSESSMENT:  CLINICAL IMPRESSION: Pt is compliant with wearing sling and performing HEP.  Pt performed home exercises well.  Pt is progressing well with L shoulder PROM and demonstrates improved flexion and ER w/n protocol ranges as evidenced by goniometric measurements. Pt tolerated PROM well without c/o's and required minimal cuing to decrease guarding.  Pt met STG #2.   Pt responded well to Rx having no pain after Rx.  Pt should benefit from cont skilled PT services per protocol to address goals, improve ROM, and improve function.   OBJECTIVE IMPAIRMENTS: decreased activity tolerance, decreased mobility, difficulty walking, decreased ROM, decreased strength, impaired UE functional use, and pain.   ACTIVITY LIMITATIONS: sleeping, bathing, dressing, self feeding, reach over head, and hygiene/grooming  PARTICIPATION LIMITATIONS: meal prep, cleaning, laundry, driving, shopping, community activity, occupation, and yard work  PERSONAL FACTORS: None   REHAB POTENTIAL: Excellent  CLINICAL DECISION MAKING: Stable/uncomplicated  EVALUATION COMPLEXITY: Low  GOALS: Goals reviewed with patient? Yes  SHORT TERM GOALS: Target date: 12/15/2022    Patient will increase passive L external rotation to 90 degrees Baseline: Goal status: INITIAL  2.  Patient will increase left shoulder passive flexion to 120 degrees Baseline:  Goal status: GOAL MET  3.  Patient will be independent with basic exercise program Baseline:  Goal status: INITIAL  LONG TERM GOALS: Target date: 01/11/2023    Patient will reach to a shelf overhead without increased left shoulder pain Baseline:  Goal status: INITIAL  2.  Patient will be able to reach behind her head without increased pain in order to perform grooming tasks Baseline:  Goal status: INITIAL  3.  Patient will be independent with full exercise program including light gym activity Baseline:  Goal status: INITIAL  4.  Patient will reach behind her back to T12 without increased left shoulder pain in order to perform ADLs Baseline:  Goal status: INITIAL PLAN: PT FREQUENCY: 2x/week  PT DURATION: 12 weeks  PLANNED INTERVENTIONS: Therapeutic exercises, Therapeutic activity, Neuromuscular re-education, Patient/Family education, Self Care, Joint mobilization, Aquatic Therapy, Dry Needling, Electrical stimulation,  Cryotherapy, Moist heat, Taping, Ultrasound, and Manual therapy  PLAN FOR NEXT SESSION: Cont per Dr. Nicki Guadalajara rotator cuff repair protocol   Selinda Michaels III PT, DPT 12/02/22 12:45 PM

## 2022-12-02 ENCOUNTER — Encounter (HOSPITAL_BASED_OUTPATIENT_CLINIC_OR_DEPARTMENT_OTHER): Payer: Self-pay | Admitting: Physical Therapy

## 2022-12-08 ENCOUNTER — Encounter (HOSPITAL_BASED_OUTPATIENT_CLINIC_OR_DEPARTMENT_OTHER): Payer: Self-pay | Admitting: Physical Therapy

## 2022-12-08 ENCOUNTER — Ambulatory Visit (HOSPITAL_BASED_OUTPATIENT_CLINIC_OR_DEPARTMENT_OTHER): Payer: Commercial Managed Care - PPO | Admitting: Physical Therapy

## 2022-12-08 DIAGNOSIS — M25612 Stiffness of left shoulder, not elsewhere classified: Secondary | ICD-10-CM

## 2022-12-08 DIAGNOSIS — M25512 Pain in left shoulder: Secondary | ICD-10-CM | POA: Diagnosis not present

## 2022-12-08 DIAGNOSIS — M6281 Muscle weakness (generalized): Secondary | ICD-10-CM

## 2022-12-08 NOTE — Therapy (Signed)
OUTPATIENT PHYSICAL THERAPY TREATMENT NOTE   Patient Name: Rose Bell MRN: [id number] DOB:03-Aug-1981, 42 y.o., female Today's Date: 12/09/2022  END OF SESSION:  PT End of Session - 12/08/22 1553     Visit Number 4    Number of Visits 16    Date for PT Re-Evaluation 01/12/23    Authorization Type [place] [employer]    PT Start Time 1515    PT Stop Time 1554    PT Time Calculation (min) 39 min    Activity Tolerance Patient tolerated treatment well    Behavior During Therapy [facility] for tasks assessed/performed                Past Medical History:  Diagnosis Date   Hyperlipemia    Hypertension    no current problems, no meds   Pneumonia    Seasonal allergies    Tear of left rotator cuff 10/2022   Past Surgical History:  Procedure Laterality Date   ARTHOSCOPIC ROTAOR CUFF REPAIR Left 11/10/2022   Procedure: LEFT SHOULDER ARTHROSCOPY WITH ROTATOR CUFF REPAIR;  Surgeon: Huel Cote, MD;  Location: [facility];  Service: Orthopedics;  Laterality: Left;   BREAST BIOPSY Right    TUBAL LIGATION     Patient Active Problem List   Diagnosis Date Noted   Rotator cuff tear, left 10/22/2022   Elevated BP without diagnosis of hypertension 01/19/2022   Pneumonia due to COVID-19 virus 10/26/2019   Fracture of finger, middle phalanx, left, closed 09/26/2019   Orgasmic headache 01/08/2016   Obesity (BMI 30-39.9) 01/02/2015   Hyperlipidemia 08/10/2014   GERD (gastroesophageal reflux disease) 03/04/2012   General medical examination 06/26/2011   Screening for malignant neoplasm of the cervix 06/26/2011   Vitamin D deficiency 05/30/2009   Anxiety state 05/30/2009   B12 deficiency 05/23/2009    PCP: Dr Neena Rhymes MD    REFERRING PROVIDER: Dr Huel Cote   REFERRING DIAG: [phone] (ICD-10-CM) - Traumatic complete tear of left rotator cuff, initial encounter   THERAPY DIAG:  Stiffness of left shoulder, not elsewhere classified  Acute pain of left shoulder  Muscle  weakness (generalized)  Rationale for Evaluation and Treatment: Rehabilitation  ONSET DATE: 11/10/2022 R RTC repair  SAD and extensive debridement   Days since surgery: 28   SUBJECTIVE:                                                                                                                                                                                      SUBJECTIVE STATEMENT: Patient has no significant issues today. I little has been a little sore at times.     PERTINENT HISTORY: HTN,, RTC  tear  PAIN:  NPRS scale:  0/10 currently, at worst 3/10  Pain location: left anterior shoulder  Pain description: aching  Aggravating factors: use of the shoulder  Relieving factors: Adjusting her movements   PRECAUTIONS: Shoulder  WEIGHT BEARING RESTRICTIONS: Yes NWB  FALLS:  Has patient fallen in last 6 months? Yes. 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   Hobbies: exercise    PLOF: Independent  PATIENT GOALS:  To have use of her arm    To be able to use her left arm   NEXT MD VISIT:   OBJECTIVE:   DIAGNOSTIC FINDINGS:  Noting post-op     TODAY'S TREATMENT:                                                                                                                                         2/21  Manual: Prom into all planes per protocol  Elbow flexion with self assist x20  Scap retraction x20   Last visit   Observation: 3 steri strips over portals without any sings of infection.  Pt had no drainage.   Therapeutic Exercise: -Reviewed pain level, response to prior Rx, and HEP compliance. -Reviewed HEP. -Pt performed:  Seated scapular retraction with arms supported x 10 reps  Pendulums cw and ccw 2x10 each  Wrist flex and extension AROM with arm supported x 20 rep each  Yellow putty gripping 2x10 reps with arm supported   Manual Therapy:  Gentle distraction with oscillation  and grade I-II AP and inf jt mobs to L GH joint to improve pain, stiffness, and to normalize arthrokinematics Pt received L shoulder flexion, abd, and ER PROM per pt and tissue tolerance within protocol limits.  L shoulder PROM: Flexion:  131 deg,  ER:  31 deg     PATIENT EDUCATION: Education details: HEP, surgical protocol limitations, ROM findings and progress, exercise form, and POC.  PT answered pt's questions.  Instruction in icing.   Person educated: Patient Education method: Explanation, Demonstration, Tactile cues, Verbal cues, and Handouts Education comprehension: verbalized understanding, returned demonstration, verbal cues required, tactile cues required, and needs further education  HOME EXERCISE PROGRAM: Access Code: [email or web address] URL: [email or web address] Date: 11/17/2022 Prepared by: Lorayne Bender  Exercises - Seated Scapular Retraction  - 1 x daily - 7 x weekly - 3 sets - 10 reps - Circular Shoulder Pendulum with Table Support  - 1 x daily - 7 x weekly - 3 sets - 10 reps - Supported Elbow Flexion Extension PROM  - 1 x daily - 7 x weekly - 3 sets - 10 reps  Updated HEP: - Wrist Extension AROM  - 2-3 x daily - 7 x weekly - 2-3 sets - 10 reps - Wrist Flexion AROM  - 2-3 x daily - 7 x weekly - 2-3 sets - 10  reps - Seated Gripping Towel  - 2-3 x daily - 7 x weekly - 2 sets - 10 reps  ASSESSMENT:  CLINICAL IMPRESSION: Pt is progressing well per protocol. She had no significant increase in pain. She was somewhat guarded in the beginning but relaxed as the treatment went on. We will continue to progress per protocol. She is at 4 weeks at this time. We will begin self stretching at 6 weeks.   OBJECTIVE IMPAIRMENTS: decreased activity tolerance, decreased mobility, difficulty walking, decreased ROM, decreased strength, impaired UE functional use, and pain.   ACTIVITY LIMITATIONS: sleeping, bathing, dressing, self feeding, reach over head, and  hygiene/grooming  PARTICIPATION LIMITATIONS: meal prep, cleaning, laundry, driving, shopping, community activity, occupation, and yard work  PERSONAL FACTORS: None   REHAB POTENTIAL: Excellent  CLINICAL DECISION MAKING: Stable/uncomplicated  EVALUATION COMPLEXITY: Low  GOALS: Goals reviewed with patient? Yes  SHORT TERM GOALS: Target date: 12/15/2022    Patient will increase passive L external rotation to 90 degrees Baseline: Goal status: INITIAL  2.  Patient will increase left shoulder passive flexion to 120 degrees Baseline:  Goal status: GOAL MET  3.  Patient will be independent with basic exercise program Baseline:  Goal status: INITIAL  LONG TERM GOALS: Target date: 01/11/2023    Patient will reach to a shelf overhead without increased left shoulder pain Baseline:  Goal status: INITIAL  2.  Patient will be able to reach behind her head without increased pain in order to perform grooming tasks Baseline:  Goal status: INITIAL  3.  Patient will be independent with full exercise program including light gym activity Baseline:  Goal status: INITIAL  4.  Patient will reach behind her back to T12 without increased left shoulder pain in order to perform ADLs Baseline:  Goal status: INITIAL PLAN: PT FREQUENCY: 2x/week  PT DURATION: 12 weeks  PLANNED INTERVENTIONS: Therapeutic exercises, Therapeutic activity, Neuromuscular re-education, Patient/Family education, Self Care, Joint mobilization, Aquatic Therapy, Dry Needling, Electrical stimulation, Cryotherapy, Moist heat, Taping, Ultrasound, and Manual therapy  PLAN FOR NEXT SESSION: Cont per Dr. Suzan Nailer rotator cuff repair protocol   Audie Clear III PT, DPT 12/09/22 9:08 AM

## 2022-12-09 ENCOUNTER — Telehealth: Payer: Self-pay | Admitting: Orthopaedic Surgery

## 2022-12-09 ENCOUNTER — Encounter (HOSPITAL_BASED_OUTPATIENT_CLINIC_OR_DEPARTMENT_OTHER): Payer: Self-pay | Admitting: Physical Therapy

## 2022-12-09 NOTE — Telephone Encounter (Signed)
Received vm from patient needing to get copy of OP Note. IC, lmvm advised will need to sign auth. I can email auth or she can stop by our office at [facility]. I stated for pt to [facility]mc to let me know if would like the [place] emailed to her. [phone]

## 2022-12-14 ENCOUNTER — Encounter (HOSPITAL_BASED_OUTPATIENT_CLINIC_OR_DEPARTMENT_OTHER): Payer: Self-pay | Admitting: Physical Therapy

## 2022-12-14 ENCOUNTER — Ambulatory Visit (HOSPITAL_BASED_OUTPATIENT_CLINIC_OR_DEPARTMENT_OTHER): Payer: Commercial Managed Care - PPO | Admitting: Physical Therapy

## 2022-12-14 DIAGNOSIS — M6281 Muscle weakness (generalized): Secondary | ICD-10-CM | POA: Diagnosis not present

## 2022-12-14 DIAGNOSIS — M25612 Stiffness of left shoulder, not elsewhere classified: Secondary | ICD-10-CM

## 2022-12-14 DIAGNOSIS — M25512 Pain in left shoulder: Secondary | ICD-10-CM

## 2022-12-14 NOTE — Therapy (Signed)
OUTPATIENT PHYSICAL THERAPY TREATMENT NOTE   Patient Name: Rose Bell MRN: [id number] DOB:1981/07/08, 42 y.o., female Today's Date: 12/14/2022  END OF SESSION:  PT End of Session - 12/14/22 1309     Visit Number 5    Number of Visits 16    Date for PT Re-Evaluation 01/12/23    Authorization Type [employer]    PT Start Time 1305    PT Stop Time 1340    PT Time Calculation (min) 35 min    Activity Tolerance Patient tolerated treatment well    Behavior During Therapy [facility] for tasks assessed/performed                 Past Medical History:  Diagnosis Date   Hyperlipemia    Hypertension    no current problems, no meds   Pneumonia    Seasonal allergies    Tear of left rotator cuff 10/2022   Past Surgical History:  Procedure Laterality Date   ARTHOSCOPIC ROTAOR CUFF REPAIR Left 11/10/2022   Procedure: LEFT SHOULDER ARTHROSCOPY WITH ROTATOR CUFF REPAIR;  Surgeon: Vanetta Mulders, MD;  Location: [place];  Service: Orthopedics;  Laterality: Left;   BREAST BIOPSY Right    TUBAL LIGATION     Patient Active Problem List   Diagnosis Date Noted   Rotator cuff tear, left 10/22/2022   Elevated BP without diagnosis of hypertension 01/19/2022   Pneumonia due to COVID-19 virus 10/26/2019   Fracture of finger, middle phalanx, left, closed 09/26/2019   Orgasmic headache 01/08/2016   Obesity (BMI 30-39.9) 01/02/2015   Hyperlipidemia 08/10/2014   GERD (gastroesophageal reflux disease) 03/04/2012   General medical examination 06/26/2011   Screening for malignant neoplasm of the cervix 06/26/2011   Vitamin D deficiency 05/30/2009   Anxiety state 05/30/2009   B12 deficiency 05/23/2009    PCP: Dr Annye Asa MD    REFERRING PROVIDER: Dr Vanetta Mulders   REFERRING DIAG: [phone] (ICD-10-CM) - Traumatic complete tear of left rotator cuff, initial encounter   THERAPY DIAG:  Stiffness of left shoulder, not elsewhere classified  Acute pain of left  shoulder  Muscle weakness (generalized)  Rationale for Evaluation and Treatment: Rehabilitation  ONSET DATE: 11/10/2022 R RTC repair  SAD and extensive debridement   Days since surgery: 34   SUBJECTIVE:                                                                                                                                                                                      SUBJECTIVE STATEMENT: Pt is 4 weeks and 6 days s/p RCR.  Patient has no c/o's.  Pt is sling dependent and is limited  with ADLs/IADLs.  Pt denies any adverse effects after prior Rx.  She denies pain currently.      PERTINENT HISTORY: HTN,, RTC tear  PAIN:  NPRS scale:  0/10 currently, at worst 3/10  Pain location: left anterior shoulder  Pain description: aching  Aggravating factors: use of the shoulder  Relieving factors: Adjusting her movements   PRECAUTIONS: Shoulder  WEIGHT BEARING RESTRICTIONS: Yes NWB  FALLS:  Has patient fallen in last 6 months? Yes. Number of falls 1  LIVING ENVIRONMENT: Nothing pertinent   OCCUPATION: Works for [employer]; Works at a Teaching laboratory technician.   Hobbies: exercise    PLOF: Independent  PATIENT GOALS:  To have use of her arm    To be able to use her left arm   NEXT MD VISIT:   OBJECTIVE:   DIAGNOSTIC FINDINGS:  Noting post-op     TODAY'S TREATMENT:                                                                                                                                           Observation:  portals are closed and intact without any sings of infection.    Therapeutic Exercise: -Reviewed pain level, response to prior Rx, and HEP. -Pt performed:  Seated scapular retraction with arms supported 2 x 10 reps  Pendulums cw and ccw 2x10 each    Manual Therapy:  Gentle distraction with oscillation and grade I-II AP and inf jt mobs to L GH joint to improve pain, stiffness, and to normalize arthrokinematics Pt received L shoulder flexion, abd, ER, and IR  PROM in supine per pt and tissue tolerance within protocol limits.  L shoulder PROM: Flexion:  135 deg  ER:  40 deg     PATIENT EDUCATION: Education details: HEP, surgical protocol limitations, ROM findings and progress, exercise form, and POC.  PT answered pt's questions.  Instruction in icing.   Person educated: Patient Education method: Explanation, Demonstration, Tactile cues, Verbal cues, and Handouts Education comprehension: verbalized understanding, returned demonstration, verbal cues required, tactile cues required, and needs further education  HOME EXERCISE PROGRAM: Access Code: [email or web address] URL: [email or web address] Date: 11/17/2022 Prepared by: Carolyne Littles  Exercises - Seated Scapular Retraction  - 1 x daily - 7 x weekly - 3 sets - 10 reps - Circular Shoulder Pendulum with Table Support  - 1 x daily - 7 x weekly - 3 sets - 10 reps - Supported Elbow Flexion Extension PROM  - 1 x daily - 7 x weekly - 3 sets - 10 reps  Updated HEP: - Wrist Extension AROM  - 2-3 x daily - 7 x weekly - 2-3 sets - 10 reps - Wrist Flexion AROM  - 2-3 x daily - 7 x weekly - 2-3 sets - 10 reps - Seated Gripping Towel  - 2-3 x daily - 7 x weekly - 2 sets - 10 reps  ASSESSMENT:  CLINICAL IMPRESSION: Pt is progressing well with L shoulder PROM per protocol. She demonstrates improved L shoulder flexion and ER PROM based upon goniometric measurements.   Pt tolerated PROM well and required occasional cuing to relax L UE and decrease guarding.  Pt responded well to rx reporting no pain, just some discomfort after Rx.  She should cont to benefit from cont skilled PT services per protocol to improve ROM and function.    OBJECTIVE IMPAIRMENTS: decreased activity tolerance, decreased mobility, difficulty walking, decreased ROM, decreased strength, impaired UE functional use, and pain.   ACTIVITY LIMITATIONS: sleeping, bathing, dressing, self feeding, reach over head, and  hygiene/grooming  PARTICIPATION LIMITATIONS: meal prep, cleaning, laundry, driving, shopping, community activity, occupation, and yard work  PERSONAL FACTORS: None   REHAB POTENTIAL: Excellent  CLINICAL DECISION MAKING: Stable/uncomplicated  EVALUATION COMPLEXITY: Low  GOALS: Goals reviewed with patient? Yes  SHORT TERM GOALS: Target date: 12/15/2022    Patient will increase passive L external rotation to 90 degrees Baseline: Goal status: INITIAL  2.  Patient will increase left shoulder passive flexion to 120 degrees Baseline:  Goal status: GOAL MET  3.  Patient will be independent with basic exercise program Baseline:  Goal status: INITIAL  LONG TERM GOALS: Target date: 01/11/2023    Patient will reach to a shelf overhead without increased left shoulder pain Baseline:  Goal status: INITIAL  2.  Patient will be able to reach behind her head without increased pain in order to perform grooming tasks Baseline:  Goal status: INITIAL  3.  Patient will be independent with full exercise program including light gym activity Baseline:  Goal status: INITIAL  4.  Patient will reach behind her back to T12 without increased left shoulder pain in order to perform ADLs Baseline:  Goal status: INITIAL PLAN: PT FREQUENCY: 2x/week  PT DURATION: 12 weeks  PLANNED INTERVENTIONS: Therapeutic exercises, Therapeutic activity, Neuromuscular re-education, Patient/Family education, Self Care, Joint mobilization, Aquatic Therapy, Dry Needling, Electrical stimulation, Cryotherapy, Moist heat, Taping, Ultrasound, and Manual therapy  PLAN FOR NEXT SESSION: Cont per Dr. Nicki Guadalajara rotator cuff repair protocol   Selinda Michaels III PT, DPT 12/14/22 2:05 PM

## 2022-12-17 ENCOUNTER — Encounter (HOSPITAL_BASED_OUTPATIENT_CLINIC_OR_DEPARTMENT_OTHER): Payer: Self-pay | Admitting: Physical Therapy

## 2022-12-17 ENCOUNTER — Ambulatory Visit (HOSPITAL_BASED_OUTPATIENT_CLINIC_OR_DEPARTMENT_OTHER): Payer: Commercial Managed Care - PPO | Admitting: Physical Therapy

## 2022-12-17 DIAGNOSIS — M25512 Pain in left shoulder: Secondary | ICD-10-CM | POA: Diagnosis not present

## 2022-12-17 DIAGNOSIS — M25612 Stiffness of left shoulder, not elsewhere classified: Secondary | ICD-10-CM | POA: Diagnosis not present

## 2022-12-17 DIAGNOSIS — M6281 Muscle weakness (generalized): Secondary | ICD-10-CM | POA: Diagnosis not present

## 2022-12-17 NOTE — Therapy (Signed)
OUTPATIENT PHYSICAL THERAPY TREATMENT NOTE   Patient Name: Rose Bell MRN: [id number] DOB:07-04-1981, 42 y.o., female Today's Date: 12/17/2022  END OF SESSION:  PT End of Session - 12/17/22 1028     Visit Number 6    Number of Visits 16    Date for PT Re-Evaluation 01/12/23    Authorization Type [employer]    PT Start Time 1027    PT Stop Time 1101    PT Time Calculation (min) 34 min    Activity Tolerance Patient tolerated treatment well    Behavior During Therapy [facility] for tasks assessed/performed                 Past Medical History:  Diagnosis Date   Hyperlipemia    Hypertension    no current problems, no meds   Pneumonia    Seasonal allergies    Tear of left rotator cuff 10/2022   Past Surgical History:  Procedure Laterality Date   ARTHOSCOPIC ROTAOR CUFF REPAIR Left 11/10/2022   Procedure: LEFT SHOULDER ARTHROSCOPY WITH ROTATOR CUFF REPAIR;  Surgeon: Vanetta Mulders, MD;  Location: [place];  Service: Orthopedics;  Laterality: Left;   BREAST BIOPSY Right    TUBAL LIGATION     Patient Active Problem List   Diagnosis Date Noted   Rotator cuff tear, left 10/22/2022   Elevated BP without diagnosis of hypertension 01/19/2022   Pneumonia due to COVID-19 virus 10/26/2019   Fracture of finger, middle phalanx, left, closed 09/26/2019   Orgasmic headache 01/08/2016   Obesity (BMI 30-39.9) 01/02/2015   Hyperlipidemia 08/10/2014   GERD (gastroesophageal reflux disease) 03/04/2012   General medical examination 06/26/2011   Screening for malignant neoplasm of the cervix 06/26/2011   Vitamin D deficiency 05/30/2009   Anxiety state 05/30/2009   B12 deficiency 05/23/2009    PCP: Dr Annye Asa MD    REFERRING PROVIDER: Dr Vanetta Mulders   REFERRING DIAG: [phone] (ICD-10-CM) - Traumatic complete tear of left rotator cuff, initial encounter   THERAPY DIAG:  Stiffness of left shoulder, not elsewhere classified  Acute pain of left  shoulder  Muscle weakness (generalized)  Rationale for Evaluation and Treatment: Rehabilitation  ONSET DATE: 11/10/2022 R RTC repair  SAD and extensive debridement   Days since surgery: 37   SUBJECTIVE:                                                                                                                                                                                      SUBJECTIVE STATEMENT: Pt is 5 weeks and 2 days s/p RCR.  Pt states her shoulder was achy after prior Rx.  She used  Ibuprofen and the ache went away.  Pt used Ibuprofen this AM.  Pt is sling dependent and is limited with ADLs/IADLs.  She denies pain currently.      PERTINENT HISTORY: HTN, RTC tear  PAIN:  NPRS scale:  0/10 currently, at worst 3/10  Pain location: left anterior shoulder  Pain description: aching  Aggravating factors: use of the shoulder  Relieving factors: Adjusting her movements   PRECAUTIONS: Shoulder  WEIGHT BEARING RESTRICTIONS: Yes NWB  FALLS:  Has patient fallen in last 6 months? Yes. Number of falls 1  LIVING ENVIRONMENT: Nothing pertinent   OCCUPATION: Works for [employer]; Works at a Teaching laboratory technician.   Hobbies: exercise    PLOF: Independent  PATIENT GOALS:  To have use of her arm    To be able to use her left arm   NEXT MD VISIT:   OBJECTIVE:   DIAGNOSTIC FINDINGS:  Noting post-op     TODAY'S TREATMENT:                                                                                                                                           Therapeutic Exercise: -Reviewed pain level, response to prior Rx, and HEP. -Pt performed:  Seated scapular retraction with arms supported 2 x 10 reps  Pendulums cw and ccw 2x10 each    Manual Therapy:  Gentle distraction with oscillation and grade I-II AP and inf jt mobs to L GH joint to improve pain, stiffness, and to normalize arthrokinematics Pt received L shoulder flexion, scaption, abd, ER, and IR PROM in supine per pt  and tissue tolerance within protocol limits.  L shoulder PROM: Flexion:  142 deg Abd:  90 deg   ER:  40 deg     PATIENT EDUCATION: Education details: HEP, surgical protocol limitations, ROM findings and progress, exercise form, and POC.  Instruction in icing.   Person educated: Patient Education method:  Explanation, Demonstration, Tactile cues, Verbal cues, and Handouts Education comprehension: verbalized understanding, returned demonstration, verbal cues required, tactile cues required, and needs further education  HOME EXERCISE PROGRAM: Access Code: [email or web address] URL: [email or web address] Date: 11/17/2022 Prepared by: Carolyne Littles  Exercises - Seated Scapular Retraction  - 1 x daily - 7 x weekly - 3 sets - 10 reps - Circular Shoulder Pendulum with Table Support  - 1 x daily - 7 x weekly - 3 sets - 10 reps - Supported Elbow Flexion Extension PROM  - 1 x daily - 7 x weekly - 3 sets - 10 reps    ASSESSMENT:  CLINICAL IMPRESSION: Pt is progressing well with L shoulder PROM per protocol.  Pt tolerated PROM well and required occasional cuing to relax L UE and decrease guarding.  Pt did have some tightness in ER though still reached 40 deg.  Pt responded well to rx reporting no pain after Rx.  She should cont to benefit from  cont skilled PT services per protocol to improve ROM and function.    OBJECTIVE IMPAIRMENTS: decreased activity tolerance, decreased mobility, difficulty walking, decreased ROM, decreased strength, impaired UE functional use, and pain.   ACTIVITY LIMITATIONS: sleeping, bathing, dressing, self feeding, reach over head, and hygiene/grooming  PARTICIPATION LIMITATIONS: meal prep, cleaning, laundry, driving, shopping, community activity, occupation, and yard work  PERSONAL FACTORS: None   REHAB POTENTIAL: Excellent  CLINICAL DECISION MAKING: Stable/uncomplicated  EVALUATION COMPLEXITY: Low  GOALS: Goals reviewed with patient? Yes  SHORT TERM  GOALS: Target date: 12/15/2022    Patient will increase passive L external rotation to 90 degrees Baseline: Goal status: INITIAL  2.  Patient will increase left shoulder passive flexion to 120 degrees Baseline:  Goal status: GOAL MET  3.  Patient will be independent with basic exercise program Baseline:  Goal status: INITIAL  LONG TERM GOALS: Target date: 01/11/2023    Patient will reach to a shelf overhead without increased left shoulder pain Baseline:  Goal status: INITIAL  2.  Patient will be able to reach behind her head without increased pain in order to perform grooming tasks Baseline:  Goal status: INITIAL  3.  Patient will be independent with full exercise program including light gym activity Baseline:  Goal status: INITIAL  4.  Patient will reach behind her back to T12 without increased left shoulder pain in order to perform ADLs Baseline:  Goal status: INITIAL PLAN: PT FREQUENCY: 2x/week  PT DURATION: 12 weeks  PLANNED INTERVENTIONS: Therapeutic exercises, Therapeutic activity, Neuromuscular re-education, Patient/Family education, Self Care, Joint mobilization, Aquatic Therapy, Dry Needling, Electrical stimulation, Cryotherapy, Moist heat, Taping, Ultrasound, and Manual therapy  PLAN FOR NEXT SESSION: Cont per Dr. Nicki Guadalajara rotator cuff repair protocol.  Pt sees MD next week   Selinda Michaels III PT, DPT 12/17/22 9:01 PM

## 2022-12-18 ENCOUNTER — Encounter (HOSPITAL_BASED_OUTPATIENT_CLINIC_OR_DEPARTMENT_OTHER): Payer: Commercial Managed Care - PPO | Admitting: Physical Therapy

## 2022-12-18 DIAGNOSIS — H348312 Tributary (branch) retinal vein occlusion, right eye, stable: Secondary | ICD-10-CM | POA: Diagnosis not present

## 2022-12-22 ENCOUNTER — Encounter (HOSPITAL_BASED_OUTPATIENT_CLINIC_OR_DEPARTMENT_OTHER): Payer: Self-pay | Admitting: Physical Therapy

## 2022-12-22 ENCOUNTER — Ambulatory Visit (HOSPITAL_BASED_OUTPATIENT_CLINIC_OR_DEPARTMENT_OTHER): Payer: Commercial Managed Care - PPO | Attending: Orthopaedic Surgery | Admitting: Physical Therapy

## 2022-12-22 DIAGNOSIS — M6281 Muscle weakness (generalized): Secondary | ICD-10-CM | POA: Diagnosis not present

## 2022-12-22 DIAGNOSIS — M25512 Pain in left shoulder: Secondary | ICD-10-CM | POA: Diagnosis not present

## 2022-12-22 DIAGNOSIS — M25612 Stiffness of left shoulder, not elsewhere classified: Secondary | ICD-10-CM

## 2022-12-22 NOTE — Therapy (Signed)
OUTPATIENT PHYSICAL THERAPY TREATMENT NOTE   Patient Name: Rose Bell MRN: [id number] DOB:[birth year], 42 y.o., female Today's Date: 12/22/2022  END OF SESSION:  PT End of Session - 12/22/22 1620     Visit Number 7    Number of Visits 16    Date for PT Re-Evaluation 01/12/23    Authorization Type [employer]    PT Start Time 1543    PT Stop Time 1632    PT Time Calculation (min) 49 min    Activity Tolerance Patient tolerated treatment well    Behavior During Therapy [facility] for tasks assessed/performed                  Past Medical History:  Diagnosis Date   Hyperlipemia    Hypertension    no current problems, no meds   Pneumonia    Seasonal allergies    Tear of left rotator cuff 10/2022   Past Surgical History:  Procedure Laterality Date   ARTHOSCOPIC ROTAOR CUFF REPAIR Left 11/10/2022   Procedure: LEFT SHOULDER ARTHROSCOPY WITH ROTATOR CUFF REPAIR;  Surgeon: Vanetta Mulders, MD;  Location: [place];  Service: Orthopedics;  Laterality: Left;   BREAST BIOPSY Right    TUBAL LIGATION     Patient Active Problem List   Diagnosis Date Noted   Rotator cuff tear, left 10/22/2022   Elevated BP without diagnosis of hypertension 01/19/2022   Pneumonia due to COVID-19 virus 10/26/2019   Fracture of finger, middle phalanx, left, closed 09/26/2019   Orgasmic headache 01/08/2016   Obesity (BMI 30-39.9) 01/02/2015   Hyperlipidemia 08/10/2014   GERD (gastroesophageal reflux disease) 03/04/2012   General medical examination 06/26/2011   Screening for malignant neoplasm of the cervix 06/26/2011   Vitamin D deficiency 05/30/2009   Anxiety state 05/30/2009   B12 deficiency 05/23/2009    PCP: Dr Annye Asa MD    REFERRING PROVIDER: Dr Vanetta Mulders   REFERRING DIAG: [phone] (ICD-10-CM) - Traumatic complete tear of left rotator cuff, initial encounter   THERAPY DIAG:  Stiffness of left shoulder, not elsewhere classified  Acute pain of left  shoulder  Muscle weakness (generalized)  Rationale for Evaluation and Treatment: Rehabilitation  ONSET DATE: 11/10/2022 R RTC repair  SAD and extensive debridement   Days since surgery: 42   SUBJECTIVE:                                                                                                                                                                                      SUBJECTIVE STATEMENT: Pt is 6 weeks s/p RCR.  Pt denies any adverse effects after prior Rx.  She used Ibuprofen before prior  Rx but not today.  Pt used Ibuprofen this AM.  Pt is sling dependent and is limited with ADLs/IADLs.  She denies pain currently.      PERTINENT HISTORY: HTN, RTC tear  PAIN:  NPRS scale:  0/10 currently, at worst 3/10  Pain location: left anterior shoulder  Pain description: aching  Aggravating factors: use of the shoulder  Relieving factors: Adjusting her movements   PRECAUTIONS: Shoulder  WEIGHT BEARING RESTRICTIONS: Yes NWB  FALLS:  Has patient fallen in last 6 months? Yes. Number of falls 1  LIVING ENVIRONMENT: Nothing pertinent   OCCUPATION: Works for [employer]; Works at a Teaching laboratory technician.   Hobbies: exercise    PLOF: Independent  PATIENT GOALS:  To have use of her arm    To be able to use her left arm   NEXT MD VISIT:   OBJECTIVE:   DIAGNOSTIC FINDINGS:  Noting post-op     TODAY'S TREATMENT:                                                                                                                                           Therapeutic Exercise: -Reviewed pain level, response to prior Rx, and HEP. -Pt performed:  Seated scapular retraction with arms supported 2 x 10 reps  Pendulums cw and ccw 2x10 each  Seated table slides in flexion 2x10  Supine clasped hands flexion AAROM x 10   Supine wand flexion AAROM 2x5 reps  Supine wand ER AAROM 2x5 reps  Pt received a HEP handout and was educated in correct form and appropriate frequency. PT instructed pt  to not perform AAROM into a painful range or a tight range.  Pt instructed she should not have increased pain with HEP.     Manual Therapy:  Gentle distraction with oscillation and grade I-II AP and inf jt mobs to L GH joint to improve pain, stiffness, and to normalize arthrokinematics Pt received L shoulder flexion, scaption, abd, and ER PROM in supine per pt and tissue tolerance within protocol limits.  Pt received gentle distraction with flexion, abd, and scaption.    L shoulder PROM: Flexion:  142 deg Abd:  90 deg   ER:  35 deg   (was 40 deg prior Rx)      PATIENT EDUCATION: Education details:  Updated HEP.  Appropriate ROM with HEP.  ROM findings and progress, surgical protocol, exercise form, and POC.  Instructed pt to ice shoulder if she has increased pain or soreness later.   Person educated: Patient Education method:  Explanation, Demonstration, Tactile cues, Verbal cues, and Handouts Education comprehension: verbalized understanding, returned demonstration, verbal cues required, tactile cues required, and needs further education  HOME EXERCISE PROGRAM: Access Code: [email or web address] URL: [email or web address] Date: 11/17/2022 Prepared by: Carolyne Littles  Updated HEP:   - Seated Shoulder Flexion Towel Slide at Table Top  - 2 x daily - 7 x  weekly - 2 sets - 10 reps - Supine Shoulder Wand flexion AAROM  - 2 x daily - 7 x weekly - 2 sets - 5 reps    ASSESSMENT:  CLINICAL IMPRESSION: Pt is progressing well with L shoulder ROM.  She continues to have some tightness and guarding with shoulder PROM which improves with increased reps of PROM.  She requires cuing to relax L UE with PROM.  Pt had decreased ROM with ER today though overall is improving.  She felt better and had improved ROM with gentle manual distraction applied with flexion, scaption, and abduction PROM.  Pt is 6 weeks post op and PT progressed ther ex to [facility] today per protocol.  Pt performed AAROM exercises  well with cuing and instruction in correct form without adverse effects.  PT updated HEP and educated pt in appropriate ROM and she should not have increased pain with exercises.  Pt demonstrates good understanding of HEP.  Pt presents to Rx without sling today and reports MD informed her early on that she would be in the sling for 6 weeks.  PT informed pt in normal time for sling usage, and informed pt that PT would send MD a message.  PT sent a message to MD asking if he wanted pt to discontinue sling today or wait until she saw him tomorrow.  Pt responded well to rx reporting no pain after Rx.  She should cont to benefit from cont skilled PT services per protocol to improve ROM and function.    OBJECTIVE IMPAIRMENTS: decreased activity tolerance, decreased mobility, difficulty walking, decreased ROM, decreased strength, impaired UE functional use, and pain.   ACTIVITY LIMITATIONS: sleeping, bathing, dressing, self feeding, reach over head, and hygiene/grooming  PARTICIPATION LIMITATIONS: meal prep, cleaning, laundry, driving, shopping, community activity, occupation, and yard work  PERSONAL FACTORS: None   REHAB POTENTIAL: Excellent  CLINICAL DECISION MAKING: Stable/uncomplicated  EVALUATION COMPLEXITY: Low  GOALS: Goals reviewed with patient? Yes  SHORT TERM GOALS: Target date: 12/15/2022    Patient will increase passive L external rotation to 90 degrees Baseline: Goal status: INITIAL  2.  Patient will increase left shoulder passive flexion to 120 degrees Baseline:  Goal status: GOAL MET  3.  Patient will be independent with basic exercise program Baseline:  Goal status: INITIAL  LONG TERM GOALS: Target date: 01/11/2023    Patient will reach to a shelf overhead without increased left shoulder pain Baseline:  Goal status: INITIAL  2.  Patient will be able to reach behind her head without increased pain in order to perform grooming tasks Baseline:  Goal status:  INITIAL  3.  Patient will be independent with full exercise program including light gym activity Baseline:  Goal status: INITIAL  4.  Patient will reach behind her back to T12 without increased left shoulder pain in order to perform ADLs Baseline:  Goal status: INITIAL PLAN: PT FREQUENCY: 2x/week  PT DURATION: 12 weeks  PLANNED INTERVENTIONS: Therapeutic exercises, Therapeutic activity, Neuromuscular re-education, Patient/Family education, Self Care, Joint mobilization, Aquatic Therapy, Dry Needling, Electrical stimulation, Cryotherapy, Moist heat, Taping, Ultrasound, and Manual therapy  PLAN FOR NEXT SESSION: Cont per Dr. Nicki Guadalajara rotator cuff repair protocol.  Pt sees MD tomorrow.  Plan to give supine wand ER for HEP if pt performs with good form and tolerates it well next Rx.    Selinda Michaels III PT, DPT 12/22/22 5:40 PM

## 2022-12-23 ENCOUNTER — Ambulatory Visit (INDEPENDENT_AMBULATORY_CARE_PROVIDER_SITE_OTHER): Payer: Commercial Managed Care - PPO | Admitting: Orthopaedic Surgery

## 2022-12-23 DIAGNOSIS — S46012A Strain of muscle(s) and tendon(s) of the rotator cuff of left shoulder, initial encounter: Secondary | ICD-10-CM

## 2022-12-23 NOTE — Progress Notes (Signed)
Post Operative Evaluation    Procedure/Date of Surgery: Left shoulder rotator cuff repair 1/23  Interval History:   Presents today status post left shoulder rotator cuff repair overall doing very well.  She has subsequently discontinued her sling and is continuing to do quite well with physical therapy.  She does have some pinching of the top of the shoulder with mobilization.   PMH/PSH/Family History/Social History/Meds/Allergies:    Past Medical History:  Diagnosis Date   Hyperlipemia    Hypertension    no current problems, no meds   Pneumonia    Seasonal allergies    Tear of left rotator cuff 10/2022   Past Surgical History:  Procedure Laterality Date   ARTHOSCOPIC ROTAOR CUFF REPAIR Left 11/10/2022   Procedure: LEFT SHOULDER ARTHROSCOPY WITH ROTATOR CUFF REPAIR;  Surgeon: Vanetta Mulders, MD;  Location: [place];  Service: Orthopedics;  Laterality: Left;   BREAST BIOPSY Right    TUBAL LIGATION     Social History   Socioeconomic History   Marital status: Married    Spouse name: Not on file   Number of children: Not on file   Years of education: Not on file   Highest education level: Not on file  Occupational History   Not on file  Tobacco Use   Smoking status: Never   Smokeless tobacco: Never  Vaping Use   Vaping Use: Never used  Substance and Sexual Activity   Alcohol use: Yes    Comment: socially   Drug use: No   Sexual activity: Yes    Birth control/protection: Surgical    Comment: Tubal Ligation  Other Topics Concern   Not on file  Social History Narrative   Not on file   Social Determinants of Health   Financial Resource Strain: Not on file  Food Insecurity: Not on file  Transportation Needs: Not on file  Physical Activity: Not on file  Stress: Not on file  Social Connections: Not on file   Family History  Problem Relation Age of Onset   Diabetes Mother    Hypertension Mother    Hypothyroidism Mother     Fibroids Mother    Heart disease Father        cardiomyopathy   Other Father        Cardio Megaly   Heart disease Paternal Uncle    Diabetes Other        father side of family   Breast cancer Neg Hx    Allergies  Allergen Reactions   Phentermine Swelling    After 3 days pt says tongue swells and was having trouble with speech   Buspirone Swelling   Bupropion Other (See Comments)    Unknown reaction    Statins Anxiety   Current Outpatient Medications  Medication Sig Dispense Refill   aspirin EC 325 MG tablet Take 1 tablet (325 mg total) by mouth daily. 30 tablet 0   Biotin 5000 MCG TABS Take 5,000 mcg by mouth daily.     cyanocobalamin 2000 MCG tablet Take 2,000 mcg by mouth daily.     loratadine (CLARITIN) 10 MG tablet Take 10 mg by mouth daily.     oxyCODONE (OXY IR/ROXICODONE) 5 MG immediate release tablet Take 1 tablet (5 mg total) by mouth every 4 (four) hours as needed (severe pain). 20 tablet  0   rosuvastatin (CRESTOR) 10 MG tablet Take 1 tablet (10 mg total) by mouth daily. 90 tablet 0   No current facility-administered medications for this visit.   No results found.  Review of Systems:   A ROS was performed including pertinent positives and negatives as documented in the HPI.   Musculoskeletal Exam:    Left shoulder range of motion in the sitting position is to 90 degrees flexion external rotation at side is to 45 internal rotation deferred today.  Internal rotation deferred today incisions are clean dry and intact.  Distal neurovascular exam intact  Imaging:      I personally reviewed and interpreted the radiographs.   Assessment:   6 weeks status post left shoulder rotator cuff repair overall doing extremely well.  At this time she will continue to advance according to the rotator cuff repair protocol.  She will begin active range of motion of this time.  All restrictions limitations were discussed.  I will plan to see her back in 6 weeks for  reassessment Plan :    -Return to clinic in 6 weeks for reassessment      I personally saw and evaluated the patient, and participated in the management and treatment plan.  Vanetta Mulders, MD Attending Physician, Orthopedic Surgery  This document was dictated using Dragon voice recognition software. A reasonable attempt at proof reading has been made to minimize errors.

## 2022-12-25 ENCOUNTER — Encounter (HOSPITAL_BASED_OUTPATIENT_CLINIC_OR_DEPARTMENT_OTHER): Payer: Self-pay | Admitting: Physical Therapy

## 2022-12-25 ENCOUNTER — Ambulatory Visit (HOSPITAL_BASED_OUTPATIENT_CLINIC_OR_DEPARTMENT_OTHER): Payer: Commercial Managed Care - PPO | Admitting: Physical Therapy

## 2022-12-25 DIAGNOSIS — M25512 Pain in left shoulder: Secondary | ICD-10-CM | POA: Diagnosis not present

## 2022-12-25 DIAGNOSIS — M6281 Muscle weakness (generalized): Secondary | ICD-10-CM | POA: Diagnosis not present

## 2022-12-25 DIAGNOSIS — M25612 Stiffness of left shoulder, not elsewhere classified: Secondary | ICD-10-CM | POA: Diagnosis not present

## 2022-12-25 NOTE — Therapy (Signed)
OUTPATIENT PHYSICAL THERAPY TREATMENT NOTE   Patient Name: Rose Bell MRN: [id number] DOB:25-Mar-1981, 42 y.o., female Today's Date: 12/25/2022  END OF SESSION:  PT End of Session - 12/25/22 0836     Visit Number 8    Number of Visits 16    Date for PT Re-Evaluation 01/12/23    Authorization Type [place] [employer]    PT Start Time 0800    PT Stop Time 0840    PT Time Calculation (min) 40 min    Activity Tolerance Patient tolerated treatment well    Behavior During Therapy [facility] for tasks assessed/performed                  Past Medical History:  Diagnosis Date   Hyperlipemia    Hypertension    no current problems, no meds   Pneumonia    Seasonal allergies    Tear of left rotator cuff 10/2022   Past Surgical History:  Procedure Laterality Date   ARTHOSCOPIC ROTAOR CUFF REPAIR Left 11/10/2022   Procedure: LEFT SHOULDER ARTHROSCOPY WITH ROTATOR CUFF REPAIR;  Surgeon: Vanetta Mulders, MD;  Location: [place];  Service: Orthopedics;  Laterality: Left;   BREAST BIOPSY Right    TUBAL LIGATION     Patient Active Problem List   Diagnosis Date Noted   Rotator cuff tear, left 10/22/2022   Elevated BP without diagnosis of hypertension 01/19/2022   Pneumonia due to COVID-19 virus 10/26/2019   Fracture of finger, middle phalanx, left, closed 09/26/2019   Orgasmic headache 01/08/2016   Obesity (BMI 30-39.9) 01/02/2015   Hyperlipidemia 08/10/2014   GERD (gastroesophageal reflux disease) 03/04/2012   General medical examination 06/26/2011   Screening for malignant neoplasm of the cervix 06/26/2011   Vitamin D deficiency 05/30/2009   Anxiety state 05/30/2009   B12 deficiency 05/23/2009    PCP: Dr Annye Asa MD    REFERRING PROVIDER: Dr Vanetta Mulders   REFERRING DIAG: [phone] (ICD-10-CM) - Traumatic complete tear of left rotator cuff, initial encounter   THERAPY DIAG:  Stiffness of left shoulder, not elsewhere classified  Acute pain of left  shoulder  Muscle weakness (generalized)  Rationale for Evaluation and Treatment: Rehabilitation  ONSET DATE: 11/10/2022 R RTC repair  SAD and extensive debridement   Days since surgery: 45   SUBJECTIVE:                                                                                                                                                                                      SUBJECTIVE STATEMENT: The patient reports no pain this  morning. She has been out of the sling.    PERTINENT HISTORY: HTN,  RTC tear  PAIN:  NPRS scale:  0/10 currently, at worst 3/10  Pain location: left anterior shoulder  Pain description: aching  Aggravating factors: use of the shoulder  Relieving factors: Adjusting her movements   PRECAUTIONS: Shoulder  WEIGHT BEARING RESTRICTIONS: Yes NWB  FALLS:  Has patient fallen in last 6 months? Yes. Number of falls 1  LIVING ENVIRONMENT: Nothing pertinent   OCCUPATION: Works for [employer]; Works at a Teaching laboratory technician.   Hobbies: exercise    PLOF: Independent  PATIENT GOALS:  To have use of her arm    To be able to use her left arm   NEXT MD VISIT:   OBJECTIVE:   DIAGNOSTIC FINDINGS:  Noting post-op     TODAY'S TREATMENT:                                                                                                                                         3/8 Manuel:Gentle distraction with oscillation and grade II-III Posterior  and inf jt mobs to L GH joint to improve pain, stiffness, and to normalize arthrokinematics  Wand ER 10x 5 sec hold  Wand press 3x10  Wand flexion 2x10   Seated scap retraction x20   Reviewed FOTO scores        Last visit   Therapeutic Exercise: -Reviewed pain level, response to prior Rx, and HEP. -Pt performed:  Seated scapular retraction with arms supported 2 x 10 reps  Pendulums cw and ccw 2x10 each  Seated table slides in flexion 2x10  Supine clasped hands flexion AAROM x 10   Supine wand flexion  AAROM 2x5 reps  Supine wand ER AAROM 2x5 reps  Pt received a HEP handout and was educated in correct form and appropriate frequency. PT instructed pt to not perform AAROM into a painful range or a tight range.  Pt instructed she should not have increased pain with HEP.     Manual Therapy:  Gentle distraction with oscillation and grade I-II AP and inf jt mobs to L GH joint to improve pain, stiffness, and to normalize arthrokinematics Pt received L shoulder flexion, scaption, abd, and ER PROM in supine per pt and tissue tolerance within protocol limits.  Pt received gentle distraction with flexion, abd, and scaption.    L shoulder PROM: Flexion:  142 deg Abd:  90 deg   ER:  35 deg   (was 40 deg prior Rx)      PATIENT EDUCATION: Education details:  Updated HEP.  Appropriate ROM with HEP.  ROM findings and progress, surgical protocol, exercise form, and POC.  Instructed pt to ice shoulder if she has increased pain or soreness later.   Person educated: Patient Education method:  Explanation, Demonstration, Tactile cues, Verbal cues, and Handouts Education comprehension: verbalized understanding, returned demonstration, verbal cues required, tactile cues required, and needs further education  HOME EXERCISE PROGRAM: Access Code: [email or web address] URL: [email or web address]  Date: 11/17/2022 Prepared by: Carolyne Littles  Updated HEP:   - Seated Shoulder Flexion Towel Slide at Table Top  - 2 x daily - 7 x weekly - 2 sets - 10 reps - Supine Shoulder Wand flexion AAROM  - 2 x daily - 7 x weekly - 2 sets - 5 reps    ASSESSMENT:  CLINICAL IMPRESSION: The patient continues to progress very well. Her range was guarded in the beginning but opened up with movement. We will continue to progress per protocol. She was given wand ER to work on at home. We also reviewed scapular retraction for home. We will advance the strengthening of her scapular exercises next visit.    OBJECTIVE  IMPAIRMENTS: decreased activity tolerance, decreased mobility, difficulty walking, decreased ROM, decreased strength, impaired UE functional use, and pain.   ACTIVITY LIMITATIONS: sleeping, bathing, dressing, self feeding, reach over head, and hygiene/grooming  PARTICIPATION LIMITATIONS: meal prep, cleaning, laundry, driving, shopping, community activity, occupation, and yard work  PERSONAL FACTORS: None   REHAB POTENTIAL: Excellent  CLINICAL DECISION MAKING: Stable/uncomplicated  EVALUATION COMPLEXITY: Low  GOALS: Goals reviewed with patient? Yes  SHORT TERM GOALS: Target date: 12/15/2022    Patient will increase passive L external rotation to 90 degrees Baseline: Goal status: INITIAL  2.  Patient will increase left shoulder passive flexion to 120 degrees Baseline:  Goal status: GOAL MET  3.  Patient will be independent with basic exercise program Baseline:  Goal status: INITIAL  LONG TERM GOALS: Target date: 01/11/2023    Patient will reach to a shelf overhead without increased left shoulder pain Baseline:  Goal status: INITIAL  2.  Patient will be able to reach behind her head without increased pain in order to perform grooming tasks Baseline:  Goal status: INITIAL  3.  Patient will be independent with full exercise program including light gym activity Baseline:  Goal status: INITIAL  4.  Patient will reach behind her back to T12 without increased left shoulder pain in order to perform ADLs Baseline:  Goal status: INITIAL PLAN: PT FREQUENCY: 2x/week  PT DURATION: 12 weeks  PLANNED INTERVENTIONS: Therapeutic exercises, Therapeutic activity, Neuromuscular re-education, Patient/Family education, Self Care, Joint mobilization, Aquatic Therapy, Dry Needling, Electrical stimulation, Cryotherapy, Moist heat, Taping, Ultrasound, and Manual therapy  PLAN FOR NEXT SESSION: Cont per Dr. Nicki Guadalajara rotator cuff repair protocol.  Pt sees MD tomorrow.  Plan to give supine  wand ER for HEP if pt performs with good form and tolerates it well next Rx.   Carolyne Littles PT DPT  12/25/22 11:31 AM

## 2022-12-29 ENCOUNTER — Encounter (HOSPITAL_BASED_OUTPATIENT_CLINIC_OR_DEPARTMENT_OTHER): Payer: Self-pay | Admitting: Physical Therapy

## 2022-12-29 ENCOUNTER — Ambulatory Visit (HOSPITAL_BASED_OUTPATIENT_CLINIC_OR_DEPARTMENT_OTHER): Payer: Commercial Managed Care - PPO | Admitting: Physical Therapy

## 2022-12-29 DIAGNOSIS — M6281 Muscle weakness (generalized): Secondary | ICD-10-CM

## 2022-12-29 DIAGNOSIS — M25512 Pain in left shoulder: Secondary | ICD-10-CM

## 2022-12-29 DIAGNOSIS — M25612 Stiffness of left shoulder, not elsewhere classified: Secondary | ICD-10-CM

## 2022-12-29 NOTE — Therapy (Signed)
OUTPATIENT PHYSICAL THERAPY TREATMENT NOTE   Patient Name: Rose Bell MRN: [id number] DOB:Apr 24, 1981, 42 y.o., female Today's Date: 12/30/2022  END OF SESSION:  PT End of Session - 12/29/22 1626     Visit Number 9    Number of Visits 16    Date for PT Re-Evaluation 01/12/23    Authorization Type [place] [employer]    PT Start Time 1623    PT Stop Time 1702    PT Time Calculation (min) 39 min    Activity Tolerance Patient tolerated treatment well    Behavior During Therapy [facility] for tasks assessed/performed                  Past Medical History:  Diagnosis Date   Hyperlipemia    Hypertension    no current problems, no meds   Pneumonia    Seasonal allergies    Tear of left rotator cuff 10/2022   Past Surgical History:  Procedure Laterality Date   ARTHOSCOPIC ROTAOR CUFF REPAIR Left 11/10/2022   Procedure: LEFT SHOULDER ARTHROSCOPY WITH ROTATOR CUFF REPAIR;  Surgeon: Huel Cote, MD;  Location: [facility];  Service: Orthopedics;  Laterality: Left;   BREAST BIOPSY Right    TUBAL LIGATION     Patient Active Problem List   Diagnosis Date Noted   Rotator cuff tear, left 10/22/2022   Elevated BP without diagnosis of hypertension 01/19/2022   Pneumonia due to COVID-19 virus 10/26/2019   Fracture of finger, middle phalanx, left, closed 09/26/2019   Orgasmic headache 01/08/2016   Obesity (BMI 30-39.9) 01/02/2015   Hyperlipidemia 08/10/2014   GERD (gastroesophageal reflux disease) 03/04/2012   General medical examination 06/26/2011   Screening for malignant neoplasm of the cervix 06/26/2011   Vitamin D deficiency 05/30/2009   Anxiety state 05/30/2009   B12 deficiency 05/23/2009    PCP: Dr Neena Rhymes MD    REFERRING PROVIDER: Dr Huel Cote   REFERRING DIAG: [phone] (ICD-10-CM) - Traumatic complete tear of left rotator cuff, initial encounter   THERAPY DIAG:  Stiffness of left shoulder, not elsewhere classified  Acute pain of left  shoulder  Muscle weakness (generalized)  Rationale for Evaluation and Treatment: Rehabilitation  ONSET DATE: 11/10/2022 R RTC repair  SAD and extensive debridement   Days since surgery: 49   SUBJECTIVE:                                                                                                                                                                                      SUBJECTIVE STATEMENT: Pt is 7 weeks s/p RCR.  Pt denies any adverse effects after prior Rx.  Pt reports compliance with HEP  and reports no adverse effects with HEP.  Pt denies pain currently.  "Every week feels more loose".  She is out of the sling.    PERTINENT HISTORY: HTN, RTC tear  PAIN:  NPRS scale:  0/10 currently, at worst 3/10  Pain location: left anterior shoulder  Pain description: aching  Aggravating factors: use of the shoulder  Relieving factors: Adjusting her movements   PRECAUTIONS: Shoulder  WEIGHT BEARING RESTRICTIONS: Yes NWB  FALLS:  Has patient fallen in last 6 months? Yes. 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   Hobbies: exercise    PLOF: Independent  PATIENT GOALS:  To have use of her arm    To be able to use her left arm   NEXT MD VISIT:   OBJECTIVE:   DIAGNOSTIC FINDINGS:  Noting post-op     TODAY'S TREATMENT:                                                                                                                                            Manual Therapy:  Gentle distraction with oscillation and grade II AP and inf jt mobs to L GH joint to improve pain, stiffness, and to normalize arthrokinematics Pt received L shoulder flexion, scaption, abd, ER, and IR PROM in supine per pt and tissue tolerance within protocol limits.    Therapeutic Exercise:   -Reviewed pain level, response to prior Rx, and HEP compliance.   -Pt performed:  Pulleys in flexion and scaption 2x10  each  Seated table slides in flexion 2x10  Supine wand flexion AAROM 2x10 reps Supine wand press x 10 reps  Supine wand ER AAROM 2x10-12 reps  Seated wand ER AAROM 2x10 reps  Supine serratus punch 2x10 with and without PT assistance  Prone extension to neutral 2x10     PATIENT EDUCATION: Education details:  Appropriate ROM with HEP.  surgical protocol, exercise form, and POC.  Instructed pt to ice shoulder if she has increased pain or soreness later.   Person educated: Patient Education method:  Explanation, Demonstration, Tactile cues, Verbal cues, and Handouts Education comprehension: verbalized understanding, returned demonstration, verbal cues required, tactile cues required, and needs further education  HOME EXERCISE PROGRAM: Access Code: [email or web address] URL: [email or web address] Date: 11/17/2022 Prepared by: Lorayne Bender    ASSESSMENT:  CLINICAL IMPRESSION: Pt is progressing appropriately with protocol.  PT progressed AAROM and AROM scapular exercises per protocol and pt tolerated progression well without c/o's.  She performed exercises well with cuing and instruction in correct form and postioning.  She does have tightness and guarding with PROM which improves with cuing and increased reps of PROM.  Pt is improving with ROM t/o L shoulder.  Pt responded well to Rx having no pain after Rx.  Pt should continue to benefit from cont skilled PT services per  protocol to address ongoing goals, improve ROM, progression of protocol, and improve function.    OBJECTIVE IMPAIRMENTS: decreased activity tolerance, decreased mobility, difficulty walking, decreased ROM, decreased strength, impaired UE functional use, and pain.   ACTIVITY LIMITATIONS: sleeping, bathing, dressing, self feeding, reach over head, and hygiene/grooming  PARTICIPATION LIMITATIONS: meal prep, cleaning, laundry, driving, shopping, community activity, occupation, and yard work  PERSONAL FACTORS: None    REHAB POTENTIAL: Excellent  CLINICAL DECISION MAKING: Stable/uncomplicated  EVALUATION COMPLEXITY: Low  GOALS: Goals reviewed with patient? Yes  SHORT TERM GOALS: Target date: 12/15/2022    Patient will increase passive L external rotation to 90 degrees Baseline: Goal status: INITIAL  2.  Patient will increase left shoulder passive flexion to 120 degrees Baseline:  Goal status: GOAL MET  3.  Patient will be independent with basic exercise program Baseline:  Goal status: INITIAL  LONG TERM GOALS: Target date: 01/11/2023    Patient will reach to a shelf overhead without increased left shoulder pain Baseline:  Goal status: INITIAL  2.  Patient will be able to reach behind her head without increased pain in order to perform grooming tasks Baseline:  Goal status: INITIAL  3.  Patient will be independent with full exercise program including light gym activity Baseline:  Goal status: INITIAL  4.  Patient will reach behind her back to T12 without increased left shoulder pain in order to perform ADLs Baseline:  Goal status: INITIAL PLAN: PT FREQUENCY: 2x/week  PT DURATION: 12 weeks  PLANNED INTERVENTIONS: Therapeutic exercises, Therapeutic activity, Neuromuscular re-education, Patient/Family education, Self Care, Joint mobilization, Aquatic Therapy, Dry Needling, Electrical stimulation, Cryotherapy, Moist heat, Taping, Ultrasound, and Manual therapy  PLAN FOR NEXT SESSION: Cont per Dr. Suzan Nailer rotator cuff repair protocol.  PN next visit.  Audie Clear III PT, DPT 12/30/22 10:17 PM

## 2023-01-01 ENCOUNTER — Ambulatory Visit (HOSPITAL_BASED_OUTPATIENT_CLINIC_OR_DEPARTMENT_OTHER): Payer: Commercial Managed Care - PPO | Admitting: Physical Therapy

## 2023-01-01 DIAGNOSIS — M6281 Muscle weakness (generalized): Secondary | ICD-10-CM | POA: Diagnosis not present

## 2023-01-01 DIAGNOSIS — M25512 Pain in left shoulder: Secondary | ICD-10-CM | POA: Diagnosis not present

## 2023-01-01 DIAGNOSIS — M25612 Stiffness of left shoulder, not elsewhere classified: Secondary | ICD-10-CM

## 2023-01-01 NOTE — Therapy (Signed)
OUTPATIENT PHYSICAL THERAPY TREATMENT NOTE   Patient Name: Rose Bell MRN: [id number] DOB:Sep 06, 1981, 42 y.o., female Today's Date: 01/02/2023  END OF SESSION:  PT End of Session - 01/01/23 0854     Visit Number 10    Number of Visits 28    Date for PT Re-Evaluation 03/05/23    Authorization Type Gretta Began    PT Start Time [phone]    PT Stop Time [phone]    PT Time Calculation (min) 45 min    Activity Tolerance Patient tolerated treatment well;No increased pain    Behavior During Therapy [facility] for tasks assessed/performed                   Past Medical History:  Diagnosis Date   Hyperlipemia    Hypertension    no current problems, no meds   Pneumonia    Seasonal allergies    Tear of left rotator cuff 10/2022   Past Surgical History:  Procedure Laterality Date   ARTHOSCOPIC ROTAOR CUFF REPAIR Left 11/10/2022   Procedure: LEFT SHOULDER ARTHROSCOPY WITH ROTATOR CUFF REPAIR;  Surgeon: Huel Cote, MD;  Location: [facility] OR;  Service: Orthopedics;  Laterality: Left;   BREAST BIOPSY Right    TUBAL LIGATION     Patient Active Problem List   Diagnosis Date Noted   Rotator cuff tear, left 10/22/2022   Elevated BP without diagnosis of hypertension 01/19/2022   Pneumonia due to COVID-19 virus 10/26/2019   Fracture of finger, middle phalanx, left, closed 09/26/2019   Orgasmic headache 01/08/2016   Obesity (BMI 30-39.9) 01/02/2015   Hyperlipidemia 08/10/2014   GERD (gastroesophageal reflux disease) 03/04/2012   General medical examination 06/26/2011   Screening for malignant neoplasm of the cervix 06/26/2011   Vitamin D deficiency 05/30/2009   Anxiety state 05/30/2009   B12 deficiency 05/23/2009    PCP: Dr Neena Rhymes MD    REFERRING PROVIDER: Dr Huel Cote   REFERRING DIAG: [phone] (ICD-10-CM) - Traumatic complete tear of left rotator cuff, initial encounter   THERAPY DIAG:  Stiffness of left shoulder, not elsewhere classified  Acute pain of  left shoulder  Muscle weakness (generalized)  Rationale for Evaluation and Treatment: Rehabilitation  ONSET DATE: 11/10/2022 R RTC repair  SAD and extensive debridement   Days since surgery: 52   SUBJECTIVE:                                                                                                                                                                                      SUBJECTIVE STATEMENT: Pt is 7 weeks and 3 days s/p RCR.  Pt states she was sore after prior Rx  for a few hours and felt fine afterwards.  Pt reports compliance with HEP.  Pt denies pain currently.  Pt reports improved shoulder mobility.  She is able to reach higher and doesn't feel as guarded.  Pt reports improved dressing and bathing and has minimal difficulty.  Pt is unable to put her hair up.  Pt is unable to perform overhead activities.  Pt is limited per protocol and unable to carry objects.      PERTINENT HISTORY: HTN, RTC tear  PAIN:  NPRS scale:  0/10 currently, at worst 3/10  Pain location: left anterior shoulder  Pain description: aching  Aggravating factors: use of the shoulder  Relieving factors: Adjusting her movements   PRECAUTIONS: Shoulder  WEIGHT BEARING RESTRICTIONS: Yes NWB  FALLS:  Has patient fallen in last 6 months? Yes. 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   Hobbies: exercise    PLOF: Independent  PATIENT GOALS:  To have use of her arm    To be able to use her left arm   NEXT MD VISIT:   OBJECTIVE:   DIAGNOSTIC FINDINGS:  Noting post-op     TODAY'S TREATMENT:                                                                                                                                            L shoulder PROM/AAROM:     Flexion: 147 / 138 (supine)   Abduction:  90   ER:  38 / 37   IR:  60  Manual Therapy:  Gentle distraction with oscillation and grade II AP and inf jt mobs  to L GH joint to improve pain, stiffness, and to normalize arthrokinematics Pt received L shoulder flexion, scaption, abd, ER, and IR PROM in supine per pt and tissue tolerance within protocol limits.    Therapeutic Exercise:   -Reviewed pain level, response to prior Rx, and HEP compliance.   -Pt performed:  Pulleys in flexion and scaption 2x10 each  Supine wand flexion AAROM x10 reps  Supine wand ER AAROM 2x10 reps  Seated wand ER AAROM 2x10 reps  Supine serratus punch 2x10 with and without PT assistance  Prone extension to neutral 2x10  Standing wall walks flexion AAROM x 10 reps  PT updated HEP.  Pt received a HEP handout and was educated in correct form and appropriate frequency.  Pt instructed in appropriate range and she should not have increased pain with exercises.      PATIENT EDUCATION: Education details:  Appropriate ROM with HEP  Updated HEP.  surgical protocol, exercise form, and POC.  Instructed pt to ice shoulder if she has increased pain or soreness later.   Person educated: Patient Education method:  Explanation, Demonstration, Tactile cues, Verbal cues, and Handouts Education comprehension: verbalized understanding, returned demonstration, verbal cues  required, tactile cues required, and needs further education  HOME EXERCISE PROGRAM: Access Code: [email or web address] URL: [email or web address] Date: 11/17/2022 Prepared by: Lorayne Bender  Updated HEP: - Supine Single Arm Shoulder Protraction  - 1 x daily - 6-7 x weekly - 2 sets - 10 reps - Prone Shoulder Extension - Single Arm  - 1 x daily - 6-7 x weekly - 2 sets - 10 reps - Seated Shoulder External Rotation AAROM with Dowel  - 2 x daily - 7 x weekly - 2 sets - 10 reps   ASSESSMENT:  CLINICAL IMPRESSION: Pt is progressing well with ROM, pain, function, and protocol.  Pt is progressing appropriately with protocol including advancing with AAROM and AROM without adverse effects.  She is improving with  functional usage of L UE including performance of ADLs.  She has expected limitations with function and reaching at this time in protocol.  Pt continues to progress with ROM t/o L shoulder.  She has tightness with shoulder ROM which improves with PROM and exercises.  She does have tightness and guarding with PROM which improves with cuing and increased reps of PROM.  PT updated HEP today and pt demonstrates good understanding.  Pt met STG's #2,3 and is progressing toward other goals.  PT updated goals today.  She responded well to Rx having no c/o's after Rx.  Pt should continue to benefit from cont skilled PT services per protocol to address ongoing goals, improve ROM, progression of protocol, and improve function.    OBJECTIVE IMPAIRMENTS: decreased activity tolerance, decreased mobility, difficulty walking, decreased ROM, decreased strength, impaired UE functional use, and pain.   ACTIVITY LIMITATIONS: sleeping, bathing, dressing, self feeding, reach over head, and hygiene/grooming  PARTICIPATION LIMITATIONS: meal prep, cleaning, laundry, driving, shopping, community activity, occupation, and yard work  PERSONAL FACTORS: None   REHAB POTENTIAL: Excellent  CLINICAL DECISION MAKING: Stable/uncomplicated  EVALUATION COMPLEXITY: Low  GOALS:  SHORT TERM GOALS: Target date: 12/15/2022    Patient will increase passive L external rotation to 90 degrees Baseline: Goal status: ONGOING  2.  Patient will increase left shoulder passive flexion to 120 degrees Baseline:  Goal status: GOAL MET  3.  Patient will be independent with basic exercise program Baseline:  Goal status: GOAL MET  LONG TERM GOALS: Target date: 03/05/2023    Patient will reach to a shelf overhead without increased left shoulder pain Baseline:  Goal status: ONGOING  2.  Patient will be able to reach behind her head without increased pain in order to perform grooming tasks Baseline:  Goal status: ONGOING  3.   Patient will be independent with full exercise program including light gym activity Baseline:  Goal status: INITIAL  4.  Patient will reach behind her back to T12 without increased left shoulder pain in order to perform ADLs Baseline:  Goal status: ONGOING  5.  Pt will demo L shoulder AROM to be [facility] t/o for performance of ADLs and IADLs.   Goal status: INITIAL  6.  Pt will be able to perform her ADLs and IADLs without significant difficulty and significant pain.  Goal status: INITIAL  PLAN: PT FREQUENCY: 2x/week  PT DURATION: 9 weeks  PLANNED INTERVENTIONS: Therapeutic exercises, Therapeutic activity, Neuromuscular re-education, Patient/Family education, Self Care, Joint mobilization, Aquatic Therapy, Dry Needling, Electrical stimulation, Cryotherapy, Moist heat, Taping, Ultrasound, and Manual therapy  PLAN FOR NEXT SESSION: Cont per Dr. Suzan Nailer rotator cuff repair protocol.  PN completed and PT will send recert.  FOTO next visit.  Lenya Sterne  Romeo Apple III PT, DPT 01/02/23 8:04 AM

## 2023-01-02 ENCOUNTER — Encounter (HOSPITAL_BASED_OUTPATIENT_CLINIC_OR_DEPARTMENT_OTHER): Payer: Self-pay | Admitting: Physical Therapy

## 2023-01-04 ENCOUNTER — Encounter: Payer: Self-pay | Admitting: Family Medicine

## 2023-01-04 ENCOUNTER — Telehealth: Payer: Self-pay | Admitting: Family Medicine

## 2023-01-04 NOTE — Telephone Encounter (Signed)
Caller name: Myalynn Hasbrook  On DPR?: Yes  Call back number: [phone] (home)  Provider they see: Midge Minium, MD  Reason for call:Pt is calling to schedule TB Test. But it's to my understanding per Noah Delaine pt hasn't seen by [facility] in the PAST 6 months so see can't get a TB shot here. -- advise

## 2023-01-05 ENCOUNTER — Ambulatory Visit (HOSPITAL_BASED_OUTPATIENT_CLINIC_OR_DEPARTMENT_OTHER): Payer: Commercial Managed Care - PPO | Admitting: Physical Therapy

## 2023-01-05 ENCOUNTER — Encounter (HOSPITAL_BASED_OUTPATIENT_CLINIC_OR_DEPARTMENT_OTHER): Payer: Self-pay | Admitting: Physical Therapy

## 2023-01-05 DIAGNOSIS — M6281 Muscle weakness (generalized): Secondary | ICD-10-CM | POA: Diagnosis not present

## 2023-01-05 DIAGNOSIS — M25612 Stiffness of left shoulder, not elsewhere classified: Secondary | ICD-10-CM

## 2023-01-05 DIAGNOSIS — M25512 Pain in left shoulder: Secondary | ICD-10-CM

## 2023-01-05 NOTE — Telephone Encounter (Signed)
Pt has been scheduled.  °

## 2023-01-05 NOTE — Telephone Encounter (Signed)
Not sure why pt was told no to an office visit, can we accommodate her request for TB test given up coming physical?

## 2023-01-05 NOTE — Therapy (Signed)
OUTPATIENT PHYSICAL THERAPY TREATMENT NOTE   Patient Name: Rose Bell MRN: [id number] DOB:Jan 25, 1981, 42 y.o., female Today's Date: 01/05/2023  END OF SESSION:  PT End of Session - 01/05/23 0812     Visit Number 11    Number of Visits 28    Date for PT Re-Evaluation 03/05/23    Authorization Type [employer]eb [employer]    PT Start Time [phone]    PT Stop Time 0850    PT Time Calculation (min) 43 min    Activity Tolerance Patient tolerated treatment well;No increased pain    Behavior During Therapy [facility] for tasks assessed/performed                   Past Medical History:  Diagnosis Date   Hyperlipemia    Hypertension    no current problems, no meds   Pneumonia    Seasonal allergies    Tear of left rotator cuff 10/2022   Past Surgical History:  Procedure Laterality Date   ARTHOSCOPIC ROTAOR CUFF REPAIR Left 11/10/2022   Procedure: LEFT SHOULDER ARTHROSCOPY WITH ROTATOR CUFF REPAIR;  Surgeon: Vanetta Mulders, MD;  Location: [place];  Service: Orthopedics;  Laterality: Left;   BREAST BIOPSY Right    TUBAL LIGATION     Patient Active Problem List   Diagnosis Date Noted   Rotator cuff tear, left 10/22/2022   Elevated BP without diagnosis of hypertension 01/19/2022   Pneumonia due to COVID-19 virus 10/26/2019   Fracture of finger, middle phalanx, left, closed 09/26/2019   Orgasmic headache 01/08/2016   Obesity (BMI 30-39.9) 01/02/2015   Hyperlipidemia 08/10/2014   GERD (gastroesophageal reflux disease) 03/04/2012   General medical examination 06/26/2011   Screening for malignant neoplasm of the cervix 06/26/2011   Vitamin D deficiency 05/30/2009   Anxiety state 05/30/2009   B12 deficiency 05/23/2009    PCP: Dr Annye Asa MD    REFERRING PROVIDER: Dr Vanetta Mulders   REFERRING DIAG: [phone] (ICD-10-CM) - Traumatic complete tear of left rotator cuff, initial encounter   THERAPY DIAG:  Stiffness of left shoulder, not elsewhere classified  Acute pain of  left shoulder  Muscle weakness (generalized)  Rationale for Evaluation and Treatment: Rehabilitation  ONSET DATE: 11/10/2022 R RTC repair  SAD and extensive debridement   Days since surgery: 56   SUBJECTIVE:                                                                                                                                                                                      SUBJECTIVE STATEMENT: Pt is 8 weeks s/p RCR.  Pt states she has been having some issues with supine wand  flexion lately.  Pt states it feels tight and she can't go back as far as she was with that exercise.  She is having soreness with exercise.  Pt states she was sore and fatigued after prior Rx though had no increased pain.  Pt reports she was "wore out" after prior Rx.  Pt denies pain currently.  Pt reports compliance with HEP and states her shoulder felt fine with the new home exercises.  Pt states she did some deep cleaning at her home over the weekend without any adverse effects.  She is able to reach higher and doesn't feel as guarded.  Pt reports improved dressing and bathing and has minimal difficulty.  Pt is unable to put her hair up.  Pt is unable to perform overhead activities.     PERTINENT HISTORY: HTN, RTC tear  PAIN:  NPRS scale:  0/10 currently, at worst 3/10  Pain location: left anterior shoulder  Pain description: aching  Aggravating factors: use of the shoulder  Relieving factors: Adjusting her movements   PRECAUTIONS: Shoulder  WEIGHT BEARING RESTRICTIONS: Yes NWB  FALLS:  Has patient fallen in last 6 months? Yes. Number of falls 1  LIVING ENVIRONMENT: Nothing pertinent   OCCUPATION: Works for [employer]; Works at a Teaching laboratory technician.   Hobbies: exercise    PLOF: Independent  PATIENT GOALS:  To have use of her arm    To be able to use her left arm   NEXT MD VISIT:   OBJECTIVE:   DIAGNOSTIC FINDINGS:  Noting post-op     TODAY'S TREATMENT:                                                                                                                                              Manual Therapy:  Gentle distraction with oscillation and grade II AP and inf jt mobs to L GH joint to improve pain, stiffness, and to normalize arthrokinematics Pt received L shoulder flexion, scaption, abd, ER, and IR PROM in supine per pt and tissue tolerance within protocol limits.    Therapeutic Exercise:   -Reviewed pain level, response to prior Rx, and HEP compliance.   -Pt performed:  Pulleys in flexion and scaption 2x10 each  Supine wand flexion AAROM x10 reps  Supine wand ER AAROM x10 reps  Seated wand ER AAROM 2x10 reps  Supine serratus punch 2x10  Prone extension to neutral 2x10  Standing wall walks flexion AAROM x 10 reps  Standing wand flexion x 5 reps  Supine flexion AROM 2x10 reps  Prone row 2x10  PT updated HEP.  Pt received a HEP handout and was educated in correct form and appropriate frequency.  Pt instructed in appropriate range and she should not have increased pain with exercises.      PATIENT EDUCATION: Education details:  Exercise form including home exercises.  Updated HEP and gave handout.  PT instructed pt in using ice.  surgical protocol, exercise form, and POC.    Person educated: Patient Education method:  Explanation, Demonstration, Tactile cues, Verbal cues, and Handouts Education comprehension: verbalized understanding, returned demonstration, verbal cues required, tactile cues required, and needs further education  HOME EXERCISE PROGRAM: Access Code: [email or web address] URL: [email or web address] Date: 11/17/2022 Prepared by: Carolyne Littles  Updated HEP: - Standing Shoulder Flexion Wall Walk  - 2 x daily - 7 x weekly - 2 sets - 7-10 reps - Supine Shoulder Flexion AROM  - 2 x daily - 7 x weekly - 1-2 sets - 10 reps   ASSESSMENT:  CLINICAL IMPRESSION: Pt is improving with function as evidenced by subjective reports.  Pt reports  having tightness and discomfort with supine wand flexion at home.  Pt performed in clinic and L UE was laterally deviating some with exercise.  PT corrected pt's form and Pt felt much better stating she didn't have tightness or discomfort with supine wand flexion exercise.  PT attempted standing wand flexion though pt felt pinching and discomfort and PT had pt stop exercise.  PT progressed exercises per protocol and pt tolerated progression well.  Pt performed supine flexion AROM well without c/o's.  PT updated HEP and pt demonstrates good understanding.  Pt responded well to Rx reporting no pain after Rx.   Pt should continue to benefit from cont skilled PT services per protocol to address ongoing goals, improve ROM, progression of protocol, and improve function.     OBJECTIVE IMPAIRMENTS: decreased activity tolerance, decreased mobility, difficulty walking, decreased ROM, decreased strength, impaired UE functional use, and pain.   ACTIVITY LIMITATIONS: sleeping, bathing, dressing, self feeding, reach over head, and hygiene/grooming  PARTICIPATION LIMITATIONS: meal prep, cleaning, laundry, driving, shopping, community activity, occupation, and yard work  PERSONAL FACTORS: None   REHAB POTENTIAL: Excellent  CLINICAL DECISION MAKING: Stable/uncomplicated  EVALUATION COMPLEXITY: Low  GOALS:  SHORT TERM GOALS: Target date: 12/15/2022    Patient will increase passive L external rotation to 90 degrees Baseline: Goal status: ONGOING  2.  Patient will increase left shoulder passive flexion to 120 degrees Baseline:  Goal status: GOAL MET  3.  Patient will be independent with basic exercise program Baseline:  Goal status: GOAL MET  LONG TERM GOALS: Target date: 03/05/2023    Patient will reach to a shelf overhead without increased left shoulder pain Baseline:  Goal status: ONGOING  2.  Patient will be able to reach behind her head without increased pain in order to perform grooming  tasks Baseline:  Goal status: ONGOING  3.  Patient will be independent with full exercise program including light gym activity Baseline:  Goal status: INITIAL  4.  Patient will reach behind her back to T12 without increased left shoulder pain in order to perform ADLs Baseline:  Goal status: ONGOING  5.  Pt will demo L shoulder AROM to be [facility] t/o for performance of ADLs and IADLs.   Goal status: INITIAL  6.  Pt will be able to perform her ADLs and IADLs without significant difficulty and significant pain.  Goal status: INITIAL  PLAN: PT FREQUENCY: 2x/week  PT DURATION: 9 weeks  PLANNED INTERVENTIONS: Therapeutic exercises, Therapeutic activity, Neuromuscular re-education, Patient/Family education, Self Care, Joint mobilization, Aquatic Therapy, Dry Needling, Electrical stimulation, Cryotherapy, Moist heat, Taping, Ultrasound, and Manual therapy  PLAN FOR NEXT SESSION: Cont per Dr. Nicki Guadalajara rotator cuff repair protocol.  FOTO next visit.  Selinda Michaels III PT, DPT 01/05/23 9:59 PM

## 2023-01-05 NOTE — Telephone Encounter (Signed)
See my chart message, she does have an upcoming physical

## 2023-01-08 ENCOUNTER — Encounter (HOSPITAL_BASED_OUTPATIENT_CLINIC_OR_DEPARTMENT_OTHER): Payer: Self-pay | Admitting: Physical Therapy

## 2023-01-08 ENCOUNTER — Ambulatory Visit (HOSPITAL_BASED_OUTPATIENT_CLINIC_OR_DEPARTMENT_OTHER): Payer: Commercial Managed Care - PPO | Admitting: Physical Therapy

## 2023-01-08 ENCOUNTER — Other Ambulatory Visit: Payer: Self-pay | Admitting: Family Medicine

## 2023-01-08 ENCOUNTER — Other Ambulatory Visit: Payer: Commercial Managed Care - PPO

## 2023-01-08 DIAGNOSIS — M25512 Pain in left shoulder: Secondary | ICD-10-CM | POA: Diagnosis not present

## 2023-01-08 DIAGNOSIS — Z111 Encounter for screening for respiratory tuberculosis: Secondary | ICD-10-CM | POA: Diagnosis not present

## 2023-01-08 DIAGNOSIS — M6281 Muscle weakness (generalized): Secondary | ICD-10-CM

## 2023-01-08 DIAGNOSIS — M25612 Stiffness of left shoulder, not elsewhere classified: Secondary | ICD-10-CM

## 2023-01-08 NOTE — Therapy (Signed)
OUTPATIENT PHYSICAL THERAPY TREATMENT NOTE   Patient Name: Rose Bell MRN: [id number] DOB:1981/07/21, 42 y.o., female Today's Date: 01/08/2023  END OF SESSION:  PT End of Session - 01/08/23 0808     Visit Number 12    Number of Visits 28    Date for PT Re-Evaluation 03/05/23    Authorization Type [employer]    PT Start Time [phone]    PT Stop Time [phone]    PT Time Calculation (min) 41 min    Activity Tolerance Patient tolerated treatment well    Behavior During Therapy [facility] for tasks assessed/performed                   Past Medical History:  Diagnosis Date   Hyperlipemia    Hypertension    no current problems, no meds   Pneumonia    Seasonal allergies    Tear of left rotator cuff 10/2022   Past Surgical History:  Procedure Laterality Date   ARTHOSCOPIC ROTAOR CUFF REPAIR Left 11/10/2022   Procedure: LEFT SHOULDER ARTHROSCOPY WITH ROTATOR CUFF REPAIR;  Surgeon: Vanetta Mulders, MD;  Location: [place];  Service: Orthopedics;  Laterality: Left;   BREAST BIOPSY Right    TUBAL LIGATION     Patient Active Problem List   Diagnosis Date Noted   Rotator cuff tear, left 10/22/2022   Elevated BP without diagnosis of hypertension 01/19/2022   Pneumonia due to COVID-19 virus 10/26/2019   Fracture of finger, middle phalanx, left, closed 09/26/2019   Orgasmic headache 01/08/2016   Obesity (BMI 30-39.9) 01/02/2015   Hyperlipidemia 08/10/2014   GERD (gastroesophageal reflux disease) 03/04/2012   General medical examination 06/26/2011   Screening for malignant neoplasm of the cervix 06/26/2011   Vitamin D deficiency 05/30/2009   Anxiety state 05/30/2009   B12 deficiency 05/23/2009    PCP: Dr Annye Asa MD    REFERRING PROVIDER: Dr Vanetta Mulders   REFERRING DIAG: [phone] (ICD-10-CM) - Traumatic complete tear of left rotator cuff, initial encounter   THERAPY DIAG:  Stiffness of left shoulder, not elsewhere classified  Acute pain of left  shoulder  Muscle weakness (generalized)  Rationale for Evaluation and Treatment: Rehabilitation  ONSET DATE: 11/10/2022 R RTC repair  SAD and extensive debridement   Days since surgery: 59   SUBJECTIVE:                                                                                                                                                                                      SUBJECTIVE STATEMENT: Pt is 8 weeks and 3 days s/p RCR.  Pt had a little soreness after prior Rx.  Pt  states she has some soreness which she thinks is from all of the HEP  Pt does have some achiness after the home exercises.  Pt reports improved performance and sx's with supine wand flexion.  Pt states she was able to keep the grandkids for a couple of hours yesterday.  Pt reports she is using her L UE more.  Pt states she is able to lift her arm higher.  Pt reached behind her back without even thinking about it.   Pt reports improved dressing and bathing and has minimal difficulty.  Pt is getting better with putting hair up though still has much difficulty. Pt is unable to perform overhead activities.     PERTINENT HISTORY: HTN, RTC tear  PAIN:  NPRS scale:  1/10 currently, at worst 3/10  Pain location: left shoulder  Pain description: aching, soreness  Aggravating factors: use of the shoulder  Relieving factors: Adjusting her movements   PRECAUTIONS: Shoulder  WEIGHT BEARING RESTRICTIONS: Yes NWB  FALLS:  Has patient fallen in last 6 months? Yes. Number of falls 1  LIVING ENVIRONMENT: Nothing pertinent   OCCUPATION: Works for [employer]; Works at a Teaching laboratory technician.   Hobbies: exercise    PLOF: Independent  PATIENT GOALS:  To have use of her arm    To be able to use her left arm   NEXT MD VISIT:   OBJECTIVE:   DIAGNOSTIC FINDINGS:  Noting post-op     TODAY'S TREATMENT:                                                                                                                                              Manual Therapy:  Gentle distraction with oscillation and grade II AP, PA, and inf jt mobs to L GH joint to improve pain, stiffness, and to normalize arthrokinematics Pt received L shoulder flexion, abd, ER, and IR PROM in supine per pt and tissue tolerance within protocol limits.    Therapeutic Exercise:   -Reviewed pain level, response to prior Rx, and HEP compliance.   -Pt performed:  Pulleys in flexion and scaption 2x10 each  Supine flexion AROM x10 reps and with head elevated 2x10 reps  S/L abduction AROM with PT assistance 2 x 10 reps  Supine ER AROM 2x10  Supine shoulder ABC x 1 rep  Standing wand IR 2x10  -PT reviewed and updated HEP.  Pt received a HEP handout and was educated in correct form and appropriate frequency.      PATIENT EDUCATION: Education details:  Exercise form including home exercises.  Updated HEP and gave handout.  PT instructed pt in using ice especially if having soreness with home exercises.  surgical protocol, exercise form, and POC.    Person educated: Patient Education method:  Explanation, Demonstration, Tactile cues, Verbal cues, and Handouts Education comprehension: verbalized understanding, returned demonstration, verbal cues required, tactile cues required,  and needs further education  HOME EXERCISE PROGRAM: Access Code: [email or web address] URL: [email or web address] Date: 11/17/2022 Prepared by: Carolyne Littles  Updated HEP: - Supine Shoulder Alphabet  - 1 x daily - 7 x weekly - 1 reps   ASSESSMENT:  CLINICAL IMPRESSION: Pt is improving with function as evidenced by subjective reports.  She was able to keep the grandkids, lift UE higher, and use UE more with daily activities.  Pt is compliant with HEP.  PT updated HEP today and she demonstrates good understanding.  Pt is progressing with PROM.  She continues to have tightness in PROM especially in abd and ER which improves with increased reps of PROM.  Pt is progressing  appropriately with protocol.  PT is progressing with AROM and AAROM exercises and pt performed exercises well with cuing and instruction for correct form.  Pt responded well to Rx having no c/o's after Rx.  Pt should continue to benefit from cont skilled PT services per protocol to address ongoing goals, improve ROM, progression of protocol, and improve function.           OBJECTIVE IMPAIRMENTS: decreased activity tolerance, decreased mobility, difficulty walking, decreased ROM, decreased strength, impaired UE functional use, and pain.   ACTIVITY LIMITATIONS: sleeping, bathing, dressing, self feeding, reach over head, and hygiene/grooming  PARTICIPATION LIMITATIONS: meal prep, cleaning, laundry, driving, shopping, community activity, occupation, and yard work  PERSONAL FACTORS: None   REHAB POTENTIAL: Excellent  CLINICAL DECISION MAKING: Stable/uncomplicated  EVALUATION COMPLEXITY: Low  GOALS:  SHORT TERM GOALS: Target date: 12/15/2022    Patient will increase passive L external rotation to 90 degrees Baseline: Goal status: ONGOING  2.  Patient will increase left shoulder passive flexion to 120 degrees Baseline:  Goal status: GOAL MET  3.  Patient will be independent with basic exercise program Baseline:  Goal status: GOAL MET  LONG TERM GOALS: Target date: 03/05/2023    Patient will reach to a shelf overhead without increased left shoulder pain Baseline:  Goal status: ONGOING  2.  Patient will be able to reach behind her head without increased pain in order to perform grooming tasks Baseline:  Goal status: ONGOING  3.  Patient will be independent with full exercise program including light gym activity Baseline:  Goal status: INITIAL  4.  Patient will reach behind her back to T12 without increased left shoulder pain in order to perform ADLs Baseline:  Goal status: ONGOING  5.  Pt will demo L shoulder AROM to be [facility] t/o for performance of ADLs and IADLs.   Goal  status: INITIAL  6.  Pt will be able to perform her ADLs and IADLs without significant difficulty and significant pain.  Goal status: INITIAL  PLAN: PT FREQUENCY: 2x/week  PT DURATION: 9 weeks  PLANNED INTERVENTIONS: Therapeutic exercises, Therapeutic activity, Neuromuscular re-education, Patient/Family education, Self Care, Joint mobilization, Aquatic Therapy, Dry Needling, Electrical stimulation, Cryotherapy, Moist heat, Taping, Ultrasound, and Manual therapy  PLAN FOR NEXT SESSION: Cont per Dr. Nicki Guadalajara rotator cuff repair protocol.  FOTO next visit.  Selinda Michaels III PT, DPT 01/08/23 9:15 AM

## 2023-01-08 NOTE — Progress Notes (Signed)
Order entered for pt's lab visit

## 2023-01-12 ENCOUNTER — Encounter (HOSPITAL_BASED_OUTPATIENT_CLINIC_OR_DEPARTMENT_OTHER): Payer: Self-pay | Admitting: Physical Therapy

## 2023-01-12 ENCOUNTER — Ambulatory Visit (HOSPITAL_BASED_OUTPATIENT_CLINIC_OR_DEPARTMENT_OTHER): Payer: Commercial Managed Care - PPO | Admitting: Physical Therapy

## 2023-01-12 DIAGNOSIS — M25512 Pain in left shoulder: Secondary | ICD-10-CM

## 2023-01-12 DIAGNOSIS — M25612 Stiffness of left shoulder, not elsewhere classified: Secondary | ICD-10-CM | POA: Diagnosis not present

## 2023-01-12 DIAGNOSIS — M6281 Muscle weakness (generalized): Secondary | ICD-10-CM | POA: Diagnosis not present

## 2023-01-12 LAB — QUANTIFERON-TB GOLD PLUS
Mitogen-NIL: >10 IU/mL
NIL: 0.09 IU/mL
QuantiFERON-TB Gold Plus: NEGATIVE
TB1-NIL: 0.02 IU/mL
TB2-NIL: 0.01 IU/mL

## 2023-01-12 NOTE — Therapy (Signed)
OUTPATIENT PHYSICAL THERAPY TREATMENT NOTE   Patient Name: Rose Bell MRN: [id number] DOB:Mar 02, 1981, 42 y.o., female Today's Date: 01/12/2023  END OF SESSION:  PT End of Session - 01/12/23 0808     Visit Number 13    Number of Visits 28    Date for PT Re-Evaluation 03/05/23    Authorization Type [employer]    PT Start Time 0804    PT Stop Time 0848    PT Time Calculation (min) 44 min    Activity Tolerance Patient tolerated treatment well    Behavior During Therapy [facility] for tasks assessed/performed                    Past Medical History:  Diagnosis Date   Hyperlipemia    Hypertension    no current problems, no meds   Pneumonia    Seasonal allergies    Tear of left rotator cuff 10/2022   Past Surgical History:  Procedure Laterality Date   ARTHOSCOPIC ROTAOR CUFF REPAIR Left 11/10/2022   Procedure: LEFT SHOULDER ARTHROSCOPY WITH ROTATOR CUFF REPAIR;  Surgeon: Vanetta Mulders, MD;  Location: [place];  Service: Orthopedics;  Laterality: Left;   BREAST BIOPSY Right    TUBAL LIGATION     Patient Active Problem List   Diagnosis Date Noted   Rotator cuff tear, left 10/22/2022   Elevated BP without diagnosis of hypertension 01/19/2022   Pneumonia due to COVID-19 virus 10/26/2019   Fracture of finger, middle phalanx, left, closed 09/26/2019   Orgasmic headache 01/08/2016   Obesity (BMI 30-39.9) 01/02/2015   Hyperlipidemia 08/10/2014   GERD (gastroesophageal reflux disease) 03/04/2012   General medical examination 06/26/2011   Screening for malignant neoplasm of the cervix 06/26/2011   Vitamin D deficiency 05/30/2009   Anxiety state 05/30/2009   B12 deficiency 05/23/2009    PCP: Dr Annye Asa MD    REFERRING PROVIDER: Dr Vanetta Mulders   REFERRING DIAG: [phone] (ICD-10-CM) - Traumatic complete tear of left rotator cuff, initial encounter   THERAPY DIAG:  Stiffness of left shoulder, not elsewhere classified  Acute pain of left  shoulder  Muscle weakness (generalized)  Rationale for Evaluation and Treatment: Rehabilitation  ONSET DATE: 11/10/2022 L RTC repair  SAD and extensive debridement   Days since surgery: 63   SUBJECTIVE:                                                                                                                                                                                      SUBJECTIVE STATEMENT: Pt is 9 weeks s/p RCR.  Pt denies any pain currently.  She denies any adverse effects after  prior Rx.  Pt reports compliance with and tolerance of HEP.  Pt states she had an incident this weekend which caused pain.  She almost fell when stepping down a curb.  She used her R arm to catch herself though must have performed an awkward movement with L UE.  She had significant pain though didn't last long.  She didn't take any meds for the pain, it just went away.      PERTINENT HISTORY: HTN, RTC tear  PAIN:  NPRS scale: 0/10 currently, at worst 3/10  Pain location: left shoulder  Pain description: aching, soreness  Aggravating factors: use of the shoulder  Relieving factors: Adjusting her movements   PRECAUTIONS: Shoulder  WEIGHT BEARING RESTRICTIONS: Yes NWB  FALLS:  Has patient fallen in last 6 months? Yes. Number of falls 1  LIVING ENVIRONMENT: Nothing pertinent   OCCUPATION: Works for [employer]; Works at a Teaching laboratory technician.   Hobbies: exercise    PLOF: Independent  PATIENT GOALS:  To have use of her arm    To be able to use her left arm   NEXT MD VISIT:   OBJECTIVE:   DIAGNOSTIC FINDINGS:  Noting post-op     TODAY'S TREATMENT:                                                                                                                                               Manual Therapy:  Gentle distraction with oscillation and grade II AP, PA, and inf jt mobs to L GH joint to improve pain, stiffness, and to normalize arthrokinematics Pt received L shoulder flexion, abd, ER,  and IR PROM in supine per pt and tissue tolerance within protocol limits.    Therapeutic Exercise:   -Reviewed pain level, response to prior Rx, and HEP compliance.   -Pt had questions concerning form with supine wand ER to perform at home.  PT instructed pt and assisted with correct form.  She performed supine wand ER with cuing and assistance with correct form.  -Pt performed:  Pulleys in flexion and scaption 2x10 each  Supine flexion with head elevated 2x10 reps  S/L abduction AROM with PT assistance 2 x 10 reps  Supine ER AROM 2x10  Supine shoulder ABC x 1 rep  Standing wand flexion 2x10  Standing wand IR 2x10  Submax isometrics with 5 sec hold with arm at side in flex, abd, and ext x 5 reps each   Patient Surveys:  FOTO:  Prior/CurrentYM:[id number].  Goal of 62 at visit #21.         PATIENT EDUCATION: Education details:  Exercise form including home exercises.  PT instructed pt in using ice especially if having soreness with home exercises.  surgical protocol, exercise form, and POC.    Person educated: Patient Education method:  Explanation, Demonstration, Tactile cues, Verbal cues Education comprehension: verbalized understanding, returned  demonstration, verbal cues required, tactile cues required, and needs further education  HOME EXERCISE PROGRAM: Access Code: [email or web address] URL: [email or web address] Date: 11/17/2022 Prepared by: Carolyne Littles     ASSESSMENT:  CLINICAL IMPRESSION: Pt is progressing well with ROM, function, and protocol.  PT is progressing exercises per protocol to improve ROM and pt tolerates progression well.  She is improving with AAROM and AROM.  Pt was able to perform standing wand flexion with much improved form and had much improved ROM.  She was able to perform 2 sets of 10 reps.  She continues to have tightness in PROM especially in abd and ER which improves with increased reps of PROM.  Pt was tighter in ER today.  She doesn't think she  was performing the supine wand ER exercise at home correctly.  PT instructed pt in correct form and positioning and Pt performed in the clinic.  Pt demonstrates improved self perceived disability with FOTO score improving from 47 prior to 55 currently.  Pt responded well to Rx reporting no pain after Rx.  Pt should continue to benefit from cont skilled PT services per protocol to address ongoing goals, improve ROM, progression of protocol, and improve function.          OBJECTIVE IMPAIRMENTS: decreased activity tolerance, decreased mobility, difficulty walking, decreased ROM, decreased strength, impaired UE functional use, and pain.   ACTIVITY LIMITATIONS: sleeping, bathing, dressing, self feeding, reach over head, and hygiene/grooming  PARTICIPATION LIMITATIONS: meal prep, cleaning, laundry, driving, shopping, community activity, occupation, and yard work  PERSONAL FACTORS: None   REHAB POTENTIAL: Excellent  CLINICAL DECISION MAKING: Stable/uncomplicated  EVALUATION COMPLEXITY: Low  GOALS:  SHORT TERM GOALS: Target date: 12/15/2022    Patient will increase passive L external rotation to 90 degrees Baseline: Goal status: ONGOING  2.  Patient will increase left shoulder passive flexion to 120 degrees Baseline:  Goal status: GOAL MET  3.  Patient will be independent with basic exercise program Baseline:  Goal status: GOAL MET  LONG TERM GOALS: Target date: 03/05/2023    Patient will reach to a shelf overhead without increased left shoulder pain Baseline:  Goal status: ONGOING  2.  Patient will be able to reach behind her head without increased pain in order to perform grooming tasks Baseline:  Goal status: ONGOING  3.  Patient will be independent with full exercise program including light gym activity Baseline:  Goal status: INITIAL  4.  Patient will reach behind her back to T12 without increased left shoulder pain in order to perform ADLs Baseline:  Goal status:  ONGOING  5.  Pt will demo L shoulder AROM to be [facility] t/o for performance of ADLs and IADLs.   Goal status: INITIAL  6.  Pt will be able to perform her ADLs and IADLs without significant difficulty and significant pain.  Goal status: INITIAL  PLAN: PT FREQUENCY: 2x/week  PT DURATION: 9 weeks  PLANNED INTERVENTIONS: Therapeutic exercises, Therapeutic activity, Neuromuscular re-education, Patient/Family education, Self Care, Joint mobilization, Aquatic Therapy, Dry Needling, Electrical stimulation, Cryotherapy, Moist heat, Taping, Ultrasound, and Manual therapy  PLAN FOR NEXT SESSION: Cont per Dr. Nicki Guadalajara rotator cuff repair protocol.   Selinda Michaels III PT, DPT 01/12/23 10:20 PM

## 2023-01-14 ENCOUNTER — Encounter (HOSPITAL_BASED_OUTPATIENT_CLINIC_OR_DEPARTMENT_OTHER): Payer: Commercial Managed Care - PPO | Admitting: Physical Therapy

## 2023-01-19 ENCOUNTER — Telehealth: Payer: Self-pay

## 2023-01-19 ENCOUNTER — Ambulatory Visit (HOSPITAL_BASED_OUTPATIENT_CLINIC_OR_DEPARTMENT_OTHER): Payer: Commercial Managed Care - PPO | Attending: Orthopaedic Surgery | Admitting: Physical Therapy

## 2023-01-19 ENCOUNTER — Encounter (HOSPITAL_BASED_OUTPATIENT_CLINIC_OR_DEPARTMENT_OTHER): Payer: Self-pay | Admitting: Physical Therapy

## 2023-01-19 DIAGNOSIS — M6281 Muscle weakness (generalized): Secondary | ICD-10-CM | POA: Diagnosis not present

## 2023-01-19 DIAGNOSIS — M25512 Pain in left shoulder: Secondary | ICD-10-CM

## 2023-01-19 DIAGNOSIS — M25612 Stiffness of left shoulder, not elsewhere classified: Secondary | ICD-10-CM | POA: Diagnosis not present

## 2023-01-19 NOTE — Therapy (Signed)
OUTPATIENT PHYSICAL THERAPY TREATMENT NOTE   Patient Name: Rose Bell MRN: [id number] DOB:[birth year], 42 y.o., female Today's Date: 01/19/2023  END OF SESSION:           Past Medical History:  Diagnosis Date   Hyperlipemia    Hypertension    no current problems, no meds   Pneumonia    Seasonal allergies    Tear of left rotator cuff 10/2022   Past Surgical History:  Procedure Laterality Date   ARTHOSCOPIC ROTAOR CUFF REPAIR Left 11/10/2022   Procedure: LEFT SHOULDER ARTHROSCOPY WITH ROTATOR CUFF REPAIR;  Surgeon: Vanetta Mulders, MD;  Location: [facility];  Service: Orthopedics;  Laterality: Left;   BREAST BIOPSY Right    TUBAL LIGATION     Patient Active Problem List   Diagnosis Date Noted   Rotator cuff tear, left 10/22/2022   Elevated BP without diagnosis of hypertension 01/19/2022   Pneumonia due to COVID-19 virus 10/26/2019   Fracture of finger, middle phalanx, left, closed 09/26/2019   Orgasmic headache 01/08/2016   Obesity (BMI 30-39.9) 01/02/2015   Hyperlipidemia 08/10/2014   GERD (gastroesophageal reflux disease) 03/04/2012   General medical examination 06/26/2011   Screening for malignant neoplasm of the cervix 06/26/2011   Vitamin D deficiency 05/30/2009   Anxiety state 05/30/2009   B12 deficiency 05/23/2009    PCP: Dr Annye Asa MD    REFERRING PROVIDER: Dr Vanetta Mulders   REFERRING DIAG: [phone] (ICD-10-CM) - Traumatic complete tear of left rotator cuff, initial encounter   THERAPY DIAG:  No diagnosis found.  Rationale for Evaluation and Treatment: Rehabilitation  ONSET DATE: 11/10/2022 L RTC repair  SAD and extensive debridement   Days since surgery: 70   SUBJECTIVE:                                                                                                                                                                                      SUBJECTIVE STATEMENT: Pt is 9 weeks s/p RCR.  Pt denies any pain currently.  She  denies any adverse effects after prior Rx.  Pt reports compliance with and tolerance of HEP.  Pt states she had an incident this weekend which caused pain.  She almost fell when stepping down a curb.  She used her R arm to catch herself though must have performed an awkward movement with L UE.  She had significant pain though didn't last long.  She didn't take any meds for the pain, it just went away.     Patient reports no pain today.   PERTINENT HISTORY: HTN, RTC tear  PAIN:  NPRS scale: 0/10 currently, at worst 3/10  Pain location: left  shoulder  Pain description: aching, soreness  Aggravating factors: use of the shoulder  Relieving factors: Adjusting her movements   PRECAUTIONS: Shoulder  WEIGHT BEARING RESTRICTIONS: Yes NWB  FALLS:  Has patient fallen in last 6 months? Yes. Number of falls 1  LIVING ENVIRONMENT: Nothing pertinent   OCCUPATION: Works for [employer]; Works at a Teaching laboratory technician.   Hobbies: exercise    PLOF: Independent  PATIENT GOALS:  To have use of her arm    To be able to use her left arm   NEXT MD VISIT:   OBJECTIVE:   DIAGNOSTIC FINDINGS:  Noting post-op     TODAY'S TREATMENT:                                                                                                                                              01/19/23   Manual Therapy:  Gentle distraction with oscillation and grade II- III  Posterior and inf jt mobs to L GH joint to improve pain, stiffness, and to normalize arthrokinematics Pt received L shoulder flexion, abd, ER, and IR PROM in supine per pt and tissue tolerance within protocol limits.   Wand flexion 2x10  SL ER 1 lbs 2x10  SL abduction 2x10 1lb         Last Visit    Manual Therapy:  Gentle distraction with oscillation and grade II AP, PA, and inf jt mobs to L GH joint to improve pain, stiffness, and to normalize arthrokinematics Pt received L shoulder flexion, abd, ER, and IR PROM in supine per pt and tissue tolerance  within protocol limits.    Therapeutic Exercise:   -Reviewed pain level, response to prior Rx, and HEP compliance.   -Pt had questions concerning form with supine wand ER to perform at home.  PT instructed pt and assisted with correct form.  She performed supine wand ER with cuing and assistance with correct form.  -Pt performed:  Pulleys in flexion and scaption 2x10 each  Supine flexion with head elevated 2x10 reps  S/L abduction AROM with PT assistance 2 x 10 reps  Supine ER AROM 2x10  Supine shoulder ABC x 1 rep  Standing wand flexion 2x10  Standing wand IR 2x10  Submax isometrics with 5 sec hold with arm at side in flex, abd, and ext x 5 reps each   Patient Surveys:  FOTO:  Prior/[id number].  Goal of 62 at visit #21.         PATIENT EDUCATION: Education details:  Exercise form including home exercises.  PT instructed pt in using ice especially if having soreness with home exercises.  surgical protocol, exercise form, and POC.    Person educated: Patient Education method:  Explanation, Demonstration, Tactile cues, Verbal cues Education comprehension: verbalized understanding, returned demonstration, verbal cues required, tactile cues required, and needs further education  HOME EXERCISE PROGRAM: Access Code:  [email or web address] URL: [email or web address] Date: 11/17/2022 Prepared by: Carolyne Littles     ASSESSMENT:  CLINICAL IMPRESSION: The patients ER was slightly limited upon arrival today. After manual therapy she had a significant improvement in ER. She was advised to try to maintain the ER at home. We loaded some of her exercises today without a significant increase in pain. Overall she is making great progress. Her standing forward flexion is improving. Therapy updated/ reviewed her HEP.  She was advised to ice her shoulder if she gets sore later.   OBJECTIVE IMPAIRMENTS: decreased activity tolerance, decreased mobility, difficulty walking, decreased ROM,  decreased strength, impaired UE functional use, and pain.   ACTIVITY LIMITATIONS: sleeping, bathing, dressing, self feeding, reach over head, and hygiene/grooming  PARTICIPATION LIMITATIONS: meal prep, cleaning, laundry, driving, shopping, community activity, occupation, and yard work  PERSONAL FACTORS: None   REHAB POTENTIAL: Excellent  CLINICAL DECISION MAKING: Stable/uncomplicated  EVALUATION COMPLEXITY: Low  GOALS:  SHORT TERM GOALS: Target date: 12/15/2022    Patient will increase passive L external rotation to 90 degrees Baseline: Goal status: ONGOING  2.  Patient will increase left shoulder passive flexion to 120 degrees Baseline:  Goal status: GOAL MET  3.  Patient will be independent with basic exercise program Baseline:  Goal status: GOAL MET  LONG TERM GOALS: Target date: 03/05/2023    Patient will reach to a shelf overhead without increased left shoulder pain Baseline:  Goal status: ONGOING  2.  Patient will be able to reach behind her head without increased pain in order to perform grooming tasks Baseline:  Goal status: ONGOING  3.  Patient will be independent with full exercise program including light gym activity Baseline:  Goal status: INITIAL  4.  Patient will reach behind her back to T12 without increased left shoulder pain in order to perform ADLs Baseline:  Goal status: ONGOING  5.  Pt will demo L shoulder AROM to be [facility] t/o for performance of ADLs and IADLs.   Goal status: INITIAL  6.  Pt will be able to perform her ADLs and IADLs without significant difficulty and significant pain.  Goal status: INITIAL  PLAN: PT FREQUENCY: 2x/week  PT DURATION: 9 weeks  PLANNED INTERVENTIONS: Therapeutic exercises, Therapeutic activity, Neuromuscular re-education, Patient/Family education, Self Care, Joint mobilization, Aquatic Therapy, Dry Needling, Electrical stimulation, Cryotherapy, Moist heat, Taping, Ultrasound, and Manual therapy  PLAN FOR  NEXT SESSION: Cont per Dr. Nicki Guadalajara rotator cuff repair protocol.   Selinda Michaels III PT, DPT 01/19/23 8:48 AM

## 2023-01-19 NOTE — Telephone Encounter (Signed)
-----   Message from Midge Minium, MD sent at 01/18/2023  8:17 PM EDT ----- Negative TB test- great news!

## 2023-01-19 NOTE — Telephone Encounter (Signed)
Left results on pt VM  

## 2023-01-22 ENCOUNTER — Ambulatory Visit (HOSPITAL_BASED_OUTPATIENT_CLINIC_OR_DEPARTMENT_OTHER): Payer: Commercial Managed Care - PPO | Admitting: Physical Therapy

## 2023-01-22 ENCOUNTER — Encounter (HOSPITAL_BASED_OUTPATIENT_CLINIC_OR_DEPARTMENT_OTHER): Payer: Self-pay | Admitting: Physical Therapy

## 2023-01-22 DIAGNOSIS — M25612 Stiffness of left shoulder, not elsewhere classified: Secondary | ICD-10-CM

## 2023-01-22 DIAGNOSIS — M25512 Pain in left shoulder: Secondary | ICD-10-CM

## 2023-01-22 DIAGNOSIS — M6281 Muscle weakness (generalized): Secondary | ICD-10-CM | POA: Diagnosis not present

## 2023-01-22 NOTE — Therapy (Signed)
OUTPATIENT PHYSICAL THERAPY TREATMENT NOTE   Patient Name: Rose Bell MRN: [id number] DOB:02/27/1981, 42 y.o., female Today's Date: 01/22/2023  END OF SESSION:  PT End of Session - 01/22/23 1332     Visit Number 15    Number of Visits 28    Date for PT Re-Evaluation 03/05/23    Authorization Type [employer]    PT Start Time [phone]    PT Stop Time 0928    PT Time Calculation (min) 41 min    Activity Tolerance Patient tolerated treatment well    Behavior [facility] for tasks assessed/performed                     Past Medical History:  Diagnosis Date   Hyperlipemia    Hypertension    no current problems, no meds   Pneumonia    Seasonal allergies    Tear of left rotator cuff 10/2022   Past Surgical History:  Procedure Laterality Date   ARTHOSCOPIC ROTAOR CUFF REPAIR Left 11/10/2022   Procedure: LEFT SHOULDER ARTHROSCOPY WITH ROTATOR CUFF REPAIR;  Surgeon: Huel CoteBokshan, Steven, MD;  Location: [facility];  Service: Orthopedics;  Laterality: Left;   BREAST BIOPSY Right    TUBAL LIGATION     Patient Active Problem List   Diagnosis Date Noted   Rotator cuff tear, left 10/22/2022   Elevated BP without diagnosis of hypertension 01/19/2022   Pneumonia due to COVID-19 virus 10/26/2019   Fracture of finger, middle phalanx, left, closed 09/26/2019   Orgasmic headache 01/08/2016   Obesity (BMI 30-39.9) 01/02/2015   Hyperlipidemia 08/10/2014   GERD (gastroesophageal reflux disease) 03/04/2012   General medical examination 06/26/2011   Screening for malignant neoplasm of the cervix 06/26/2011   Vitamin D deficiency 05/30/2009   Anxiety state 05/30/2009   B12 deficiency 05/23/2009    PCP: Dr Neena RhymesKatherine Tabori MD    REFERRING PROVIDER: Dr Huel CoteSteven Bokshan   REFERRING DIAG: [phone] (ICD-10-CM) - Traumatic complete tear of left rotator cuff, initial encounter   THERAPY DIAG:  Stiffness of left shoulder, not elsewhere classified  Acute pain of left  shoulder  Muscle weakness (generalized)  Rationale for Evaluation and Treatment: Rehabilitation  ONSET DATE: 11/10/2022 L RTC repair  SAD and extensive debridement   Days since surgery: 73   SUBJECTIVE:                                                                                                                                                                                      SUBJECTIVE STATEMENT: The patient has no significant issues today. She reports no significant increase in pain following her visit.  Patient reports no pain today.   PERTINENT HISTORY: HTN, RTC tear  PAIN:  NPRS scale: 0/10 currently, at worst 3/10  Pain location: left shoulder  Pain description: aching, soreness  Aggravating factors: use of the shoulder  Relieving factors: Adjusting her movements   PRECAUTIONS: Shoulder  WEIGHT BEARING RESTRICTIONS: Yes NWB  FALLS:  Has patient fallen in last 6 months? Yes. Number of falls 1  LIVING ENVIRONMENT: Nothing pertinent   OCCUPATION: Works for [employer]; Works at a [employer].   Hobbies: exercise    PLOF: Independent  PATIENT GOALS:  To have use of her arm    To be able to use her left arm   NEXT MD VISIT:   OBJECTIVE:   DIAGNOSTIC FINDINGS:  Noting post-op     TODAY'S TREATMENT:                                                                                                                                          4/5 Manual Therapy:  Gentle distraction with oscillation and grade II- III  Posterior and inf jt mobs to L GH joint to improve pain, stiffness, and to normalize arthrokinematics Pt received L shoulder flexion, abd, ER, and IR PROM in supine per pt and tissue tolerance within protocol limits.   Wand flexion 2x10 1 lb SL ER 1 lbs 2x10  SL abduction 2x10 1lb  Standing Flexion 2x10 1lb Standing Rows red band 2x10  Shoulder extension red band 2x10 Standing Scaption 2x10 1lb  IR 2x10 red band  ER 2x10 red  band     01/19/23   Manual Therapy:  Gentle distraction with oscillation and grade II- III  Posterior and inf jt mobs to L GH joint to improve pain, stiffness, and to normalize arthrokinematics Pt received L shoulder flexion, abd, ER, and IR PROM in supine per pt and tissue tolerance within protocol limits.   Wand flexion 2x10  SL ER 1 lbs 2x10  SL abduction 2x10 1lb        Last Visit    Manual Therapy:  Gentle distraction with oscillation and grade II AP, PA, and inf jt mobs to L GH joint to improve pain, stiffness, and to normalize arthrokinematics Pt received L shoulder flexion, abd, ER, and IR PROM in supine per pt and tissue tolerance within protocol limits.    Therapeutic Exercise:   -Reviewed pain level, response to prior Rx, and HEP compliance.   -Pt had questions concerning form with supine wand ER to perform at home.  PT instructed pt and assisted with correct form.  She performed supine wand ER with cuing and assistance with correct form.  -Pt performed:  Pulleys in flexion and scaption 2x10 each  Supine flexion with head elevated 2x10 reps  S/L abduction AROM with PT assistance 2 x 10 reps  Supine ER AROM 2x10  Supine shoulder ABC x 1 rep  Standing wand flexion 2x10  Standing wand IR 2x10  Submax isometrics with 5 sec hold with arm at side in flex, abd, and ext x 5 reps each   Patient Surveys:  FOTO:  Prior/Current:  06/26.  Goal of 62 at visit #21.         PATIENT EDUCATION: Education details:  Exercise form including home exercises.  PT instructed pt in using ice especially if having soreness with home exercises.  surgical protocol, exercise form, and POC.    Person educated: Patient Education method:  Explanation, Demonstration, Tactile cues, Verbal cues Education comprehension: verbalized understanding, returned demonstration, verbal cues required, tactile cues required, and needs further education  HOME EXERCISE PROGRAM: Access Code: [email or web address] URL:  [email or web address] Date: 11/17/2022 Prepared by: Lorayne Bender     ASSESSMENT:  CLINICAL IMPRESSION: Patients ER ROM has greatly increased. Patient tolerated the exercises today without any increase in pain. Overall she is making great progress. Therapy added t-band IR and ER to her home program. We reviewed her home program and how to use. We will continue to progress as tolerated.   OBJECTIVE IMPAIRMENTS: decreased activity tolerance, decreased mobility, difficulty walking, decreased ROM, decreased strength, impaired UE functional use, and pain.   ACTIVITY LIMITATIONS: sleeping, bathing, dressing, self feeding, reach over head, and hygiene/grooming  PARTICIPATION LIMITATIONS: meal prep, cleaning, laundry, driving, shopping, community activity, occupation, and yard work  PERSONAL FACTORS: None   REHAB POTENTIAL: Excellent  CLINICAL DECISION MAKING: Stable/uncomplicated  EVALUATION COMPLEXITY: Low  GOALS:  SHORT TERM GOALS: Target date: 12/15/2022    Patient will increase passive L external rotation to 90 degrees Baseline: Goal status: ONGOING  2.  Patient will increase left shoulder passive flexion to 120 degrees Baseline:  Goal status: GOAL MET  3.  Patient will be independent with basic exercise program Baseline:  Goal status: GOAL MET  LONG TERM GOALS: Target date: 03/05/2023    Patient will reach to a shelf overhead without increased left shoulder pain Baseline:  Goal status: ONGOING  2.  Patient will be able to reach behind her head without increased pain in order to perform grooming tasks Baseline:  Goal status: ONGOING  3.  Patient will be independent with full exercise program including light gym activity Baseline:  Goal status: INITIAL  4.  Patient will reach behind her back to T12 without increased left shoulder pain in order to perform ADLs Baseline:  Goal status: ONGOING  5.  Pt will demo L shoulder AROM to be [facility] t/o for  performance of ADLs and IADLs.   Goal status: INITIAL  6.  Pt will be able to perform her ADLs and IADLs without significant difficulty and significant pain.  Goal status: INITIAL  PLAN: PT FREQUENCY: 2x/week  PT DURATION: 9 weeks  PLANNED INTERVENTIONS: Therapeutic exercises, Therapeutic activity, Neuromuscular re-education, Patient/Family education, Self Care, Joint mobilization, Aquatic Therapy, Dry Needling, Electrical stimulation, Cryotherapy, Moist heat, Taping, Ultrasound, and Manual therapy  PLAN FOR NEXT SESSION: Continue to progress patient per Dr. Suzan Nailer rotator cuff repair protocol and as tolerated.  Lorayne Bender PT DPT  01/22/23 1:34 PM  Cristal Ford SPT  01/22/2023  During this treatment session, the therapist was present, participating in and directing the treatment.

## 2023-01-22 NOTE — Therapy (Deleted)
OUTPATIENT PHYSICAL THERAPY TREATMENT NOTE   Patient Name: Rose Bell MRN: [id number] DOB:May 24, 1981, 42 y.o., female Today's Date: 01/19/2023  END OF SESSION:           Past Medical History:  Diagnosis Date   Hyperlipemia    Hypertension    no current problems, no meds   Pneumonia    Seasonal allergies    Tear of left rotator cuff 10/2022   Past Surgical History:  Procedure Laterality Date   ARTHOSCOPIC ROTAOR CUFF REPAIR Left 11/10/2022   Procedure: LEFT SHOULDER ARTHROSCOPY WITH ROTATOR CUFF REPAIR;  Surgeon: Huel Cote, MD;  Location: [facility] OR;  Service: Orthopedics;  Laterality: Left;   BREAST BIOPSY Right    TUBAL LIGATION     Patient Active Problem List   Diagnosis Date Noted   Rotator cuff tear, left 10/22/2022   Elevated BP without diagnosis of hypertension 01/19/2022   Pneumonia due to COVID-19 virus 10/26/2019   Fracture of finger, middle phalanx, left, closed 09/26/2019   Orgasmic headache 01/08/2016   Obesity (BMI 30-39.9) 01/02/2015   Hyperlipidemia 08/10/2014   GERD (gastroesophageal reflux disease) 03/04/2012   General medical examination 06/26/2011   Screening for malignant neoplasm of the cervix 06/26/2011   Vitamin D deficiency 05/30/2009   Anxiety state 05/30/2009   B12 deficiency 05/23/2009    PCP: Dr Neena Rhymes MD    REFERRING PROVIDER: Dr Huel Cote   REFERRING DIAG: [phone] (ICD-10-CM) - Traumatic complete tear of left rotator cuff, initial encounter   THERAPY DIAG:  No diagnosis found.  Rationale for Evaluation and Treatment: Rehabilitation  ONSET DATE: 11/10/2022 L RTC repair  SAD and extensive debridement   Days since surgery: 70   SUBJECTIVE:                                                                                                                                                                                      SUBJECTIVE STATEMENT: The patient has no significant issues today. She reports no  significant increase in pain following her visit.   Patient reports no pain today.   PERTINENT HISTORY: HTN, RTC tear  PAIN:  NPRS scale: 0/10 currently, at worst 3/10  Pain location: left shoulder  Pain description: aching, soreness  Aggravating factors: use of the shoulder  Relieving factors: Adjusting her movements   PRECAUTIONS: Shoulder  WEIGHT BEARING RESTRICTIONS: Yes NWB  FALLS:  Has patient fallen in last 6 months? Yes. 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   Hobbies: exercise    PLOF: Independent  PATIENT GOALS:  To have use of her  arm    To be able to use her left arm   NEXT MD VISIT:   OBJECTIVE:   DIAGNOSTIC FINDINGS:  Noting post-op     TODAY'S TREATMENT:                                                                                                                                          4/5 Manual Therapy:  Gentle distraction with oscillation and grade II- III  Posterior and inf jt mobs to L GH joint to improve pain, stiffness, and to normalize arthrokinematics Pt received L shoulder flexion, abd, ER, and IR PROM in supine per pt and tissue tolerance within protocol limits.   Wand flexion 2x10  SL ER 1 lbs 2x10  SL abduction 2x10 1lb        01/19/23   Manual Therapy:  Gentle distraction with oscillation and grade II- III  Posterior and inf jt mobs to L GH joint to improve pain, stiffness, and to normalize arthrokinematics Pt received L shoulder flexion, abd, ER, and IR PROM in supine per pt and tissue tolerance within protocol limits.   Wand flexion 2x10  SL ER 1 lbs 2x10  SL abduction 2x10 1lb        Last Visit    Manual Therapy:  Gentle distraction with oscillation and grade II AP, PA, and inf jt mobs to L GH joint to improve pain, stiffness, and to normalize arthrokinematics Pt received L shoulder flexion, abd, ER, and IR PROM in supine per pt and tissue  tolerance within protocol limits.    Therapeutic Exercise:   -Reviewed pain level, response to prior Rx, and HEP compliance.   -Pt had questions concerning form with supine wand ER to perform at home.  PT instructed pt and assisted with correct form.  She performed supine wand ER with cuing and assistance with correct form.  -Pt performed:  Pulleys in flexion and scaption 2x10 each  Supine flexion with head elevated 2x10 reps  S/L abduction AROM with PT assistance 2 x 10 reps  Supine ER AROM 2x10  Supine shoulder ABC x 1 rep  Standing wand flexion 2x10  Standing wand IR 2x10  Submax isometrics with 5 sec hold with arm at side in flex, abd, and ext x 5 reps each   Patient Surveys:  FOTO:  Prior/Current:  16/10:  47/55.  Goal of 62 at visit #21.         PATIENT EDUCATION: Education details:  Exercise form including home exercises.  PT instructed pt in using ice especially if having soreness with home exercises.  surgical protocol, exercise form, and POC.    Person educated: Patient Education method:  Explanation, Demonstration, Tactile cues, Verbal cues Education comprehension: verbalized understanding, returned demonstration, verbal cues required, tactile cues required, and needs further education  HOME EXERCISE PROGRAM: Access Code: [email or web address] URL: [email or web address] Date: 11/17/2022 Prepared by: Lorayne Benderavid Philana Younis  ASSESSMENT:  CLINICAL IMPRESSION: The patients ER was slightly limited upon arrival today. After manual therapy she had a significant improvement in ER. She was advised to try to maintain the ER at home. We loaded some of her exercises today without a significant increase in pain. Overall she is making great progress. Her standing forward flexion is improving. Therapy updated/ reviewed her HEP.  She was advised to ice her shoulder if she gets sore later.   OBJECTIVE IMPAIRMENTS: decreased activity tolerance, decreased mobility, difficulty walking, decreased  ROM, decreased strength, impaired UE functional use, and pain.   ACTIVITY LIMITATIONS: sleeping, bathing, dressing, self feeding, reach over head, and hygiene/grooming  PARTICIPATION LIMITATIONS: meal prep, cleaning, laundry, driving, shopping, community activity, occupation, and yard work  PERSONAL FACTORS: None   REHAB POTENTIAL: Excellent  CLINICAL DECISION MAKING: Stable/uncomplicated  EVALUATION COMPLEXITY: Low  GOALS:  SHORT TERM GOALS: Target date: 12/15/2022    Patient will increase passive L external rotation to 90 degrees Baseline: Goal status: ONGOING  2.  Patient will increase left shoulder passive flexion to 120 degrees Baseline:  Goal status: GOAL MET  3.  Patient will be independent with basic exercise program Baseline:  Goal status: GOAL MET  LONG TERM GOALS: Target date: 03/05/2023    Patient will reach to a shelf overhead without increased left shoulder pain Baseline:  Goal status: ONGOING  2.  Patient will be able to reach behind her head without increased pain in order to perform grooming tasks Baseline:  Goal status: ONGOING  3.  Patient will be independent with full exercise program including light gym activity Baseline:  Goal status: INITIAL  4.  Patient will reach behind her back to T12 without increased left shoulder pain in order to perform ADLs Baseline:  Goal status: ONGOING  5.  Pt will demo L shoulder AROM to be [facility] t/o for performance of ADLs and IADLs.   Goal status: INITIAL  6.  Pt will be able to perform her ADLs and IADLs without significant difficulty and significant pain.  Goal status: INITIAL  PLAN: PT FREQUENCY: 2x/week  PT DURATION: 9 weeks  PLANNED INTERVENTIONS: Therapeutic exercises, Therapeutic activity, Neuromuscular re-education, Patient/Family education, Self Care, Joint mobilization, Aquatic Therapy, Dry Needling, Electrical stimulation, Cryotherapy, Moist heat, Taping, Ultrasound, and Manual therapy  PLAN  FOR NEXT SESSION: Cont per Dr. Suzan NailerBoskhan's rotator cuff repair protocol.   Audie Clearoby Harrison III PT, DPT 01/19/23 8:48 AM

## 2023-01-26 ENCOUNTER — Ambulatory Visit (HOSPITAL_BASED_OUTPATIENT_CLINIC_OR_DEPARTMENT_OTHER): Payer: Commercial Managed Care - PPO | Admitting: Physical Therapy

## 2023-01-26 ENCOUNTER — Encounter (HOSPITAL_BASED_OUTPATIENT_CLINIC_OR_DEPARTMENT_OTHER): Payer: Self-pay | Admitting: Physical Therapy

## 2023-01-26 DIAGNOSIS — M6281 Muscle weakness (generalized): Secondary | ICD-10-CM | POA: Diagnosis not present

## 2023-01-26 DIAGNOSIS — M25612 Stiffness of left shoulder, not elsewhere classified: Secondary | ICD-10-CM

## 2023-01-26 DIAGNOSIS — M25512 Pain in left shoulder: Secondary | ICD-10-CM

## 2023-01-26 NOTE — Therapy (Signed)
OUTPATIENT PHYSICAL THERAPY TREATMENT NOTE   Patient Name: Rose Bell MRN: [id number] DOB:11/23/1980, 42 y.o., female Today's Date: 01/26/2023  END OF SESSION:  PT End of Session - 01/26/23 0830     Visit Number 16    Number of Visits 28    Date for PT Re-Evaluation 03/05/23    Authorization Type [facility] [employer]    PT Start Time 0800    PT Stop Time [phone]   Patient did not require full session   PT Time Calculation (min) 34 min    Activity Tolerance Patient tolerated treatment well    Behavior During Therapy [facility] for tasks assessed/performed                      Past Medical History:  Diagnosis Date   Hyperlipemia    Hypertension    no current problems, no meds   Pneumonia    Seasonal allergies    Tear of left rotator cuff 10/2022   Past Surgical History:  Procedure Laterality Date   ARTHOSCOPIC ROTAOR CUFF REPAIR Left 11/10/2022   Procedure: LEFT SHOULDER ARTHROSCOPY WITH ROTATOR CUFF REPAIR;  Surgeon: Huel CoteBokshan, Steven, MD;  Location: [facility] OR;  Service: Orthopedics;  Laterality: Left;   BREAST BIOPSY Right    TUBAL LIGATION     Patient Active Problem List   Diagnosis Date Noted   Rotator cuff tear, left 10/22/2022   Elevated BP without diagnosis of hypertension 01/19/2022   Pneumonia due to COVID-19 virus 10/26/2019   Fracture of finger, middle phalanx, left, closed 09/26/2019   Orgasmic headache 01/08/2016   Obesity (BMI 30-39.9) 01/02/2015   Hyperlipidemia 08/10/2014   GERD (gastroesophageal reflux disease) 03/04/2012   General medical examination 06/26/2011   Screening for malignant neoplasm of the cervix 06/26/2011   Vitamin D deficiency 05/30/2009   Anxiety state 05/30/2009   B12 deficiency 05/23/2009    PCP: Dr Neena RhymesKatherine Tabori MD    REFERRING PROVIDER: Dr Huel CoteSteven Bokshan   REFERRING DIAG: [phone] (ICD-10-CM) - Traumatic complete tear of left rotator cuff, initial encounter   THERAPY DIAG:  Stiffness of left shoulder, not elsewhere  classified  Acute pain of left shoulder  Muscle weakness (generalized)  Rationale for Evaluation and Treatment: Rehabilitation  ONSET DATE: 11/10/2022 L RTC repair  SAD and extensive debridement   Days since surgery: 77   SUBJECTIVE:                                                                                                                                                                                      SUBJECTIVE STATEMENT: Patients reports no pain this morning. Patient did some cleaning  during the weekend and had no issues. Patient reports minor soreness from her last visit. Patient presents with some tightness.   PERTINENT HISTORY: HTN, RTC tear  PAIN:  NPRS scale: 0/10 currently, at worst 3/10  Pain location: left shoulder  Pain description: aching, soreness  Aggravating factors: use of the shoulder  Relieving factors: Adjusting her movements   PRECAUTIONS: Shoulder  WEIGHT BEARING RESTRICTIONS: Yes NWB  FALLS:  Has patient fallen in last 6 months? Yes. 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   Hobbies: exercise    PLOF: Independent  PATIENT GOALS:  To have use of her arm    To be able to use her left arm   NEXT MD VISIT:   OBJECTIVE:   DIAGNOSTIC FINDINGS:  Noting post-op     TODAY'S TREATMENT:                                                                                                                                          4/9 Manual Therapy:  Gentle distraction with oscillation and grade II- III  Posterior and inf jt mobs to L GH joint to improve pain, stiffness, and to normalize arthrokinematics Pt received L shoulder flexion, abd, ER, and IR PROM in supine per pt and tissue tolerance within protocol limits. Pulley flexion 2x10  Sidelying ER 2x8 2lbs  Standing flexion 2x10 1lb Rows green band 2x10 Shoulder extension green band 2x10  ER green 2x10  IR green  2x10 Y liftoff yellow band 2x8 Standing ABC x1  Ball against wall up/down x 15  Ball against wall left/right x 15  Ball against wall clockwise/counterclockwise x 15 each    4/5 Manual Therapy:  Gentle distraction with oscillation and grade II- III  Posterior and inf jt mobs to L GH joint to improve pain, stiffness, and to normalize arthrokinematics Pt received L shoulder flexion, abd, ER, and IR PROM in supine per pt and tissue tolerance within protocol limits.   Wand flexion 2x10 1 lb SL ER 1 lbs 2x10  SL abduction 2x10 1lb  Standing Flexion 2x10 1lb Standing Rows red band 2x10  Shoulder extension red band 2x10 Standing Scaption 2x10 1lb  IR 2x10 red band  ER 2x10 red band     01/19/23   Manual Therapy:  Gentle distraction with oscillation and grade II- III  Posterior and inf jt mobs to L GH joint to improve pain, stiffness, and to normalize arthrokinematics Pt received L shoulder flexion, abd, ER, and IR PROM in supine per pt and tissue tolerance within protocol limits.   Wand flexion 2x10  SL ER 1 lbs 2x10  SL abduction 2x10 1lb        Last Visit    Manual Therapy:  Gentle distraction with oscillation and grade II AP, PA, and inf jt mobs to  L GH joint to improve pain, stiffness, and to normalize arthrokinematics Pt received L shoulder flexion, abd, ER, and IR PROM in supine per pt and tissue tolerance within protocol limits.    Therapeutic Exercise:   -Reviewed pain level, response to prior Rx, and HEP compliance.   -Pt had questions concerning form with supine wand ER to perform at home.  PT instructed pt and assisted with correct form.  She performed supine wand ER with cuing and assistance with correct form.  -Pt performed:  Pulleys in flexion and scaption 2x10 each  Supine flexion with head elevated 2x10 reps  S/L abduction AROM with PT assistance 2 x 10 reps  Supine ER AROM 2x10  Supine shoulder ABC x 1 rep  Standing wand flexion 2x10  Standing wand IR  2x10  Submax isometrics with 5 sec hold with arm at side in flex, abd, and ext x 5 reps each   Patient Surveys:  FOTO:  Prior/Current:  40/98.  Goal of 62 at visit #21.         PATIENT EDUCATION: Education details:  Exercise form including home exercises.  PT instructed pt in using ice especially if having soreness with home exercises.  surgical protocol, exercise form, and POC.    Person educated: Patient Education method:  Explanation, Demonstration, Tactile cues, Verbal cues Education comprehension: verbalized understanding, returned demonstration, verbal cues required, tactile cues required, and needs further education  HOME EXERCISE PROGRAM: Access Code: [email or web address] URL: [email or web address] Date: 11/17/2022 Prepared by: Lorayne Bender     ASSESSMENT:  CLINICAL IMPRESSION: Patient reported with no pain and is progressing well. Patient was able to progress to green band. Therapy added y scap lift up with yellow band to her home program. Therapy also wall up/down, side to side and clockwise/counterclockwise motions with ball against the wall. We reviewed her HEP. We will continue to progress as tolerated and will add more higher level exercises. Patient is close to discharge.    OBJECTIVE IMPAIRMENTS: decreased activity tolerance, decreased mobility, difficulty walking, decreased ROM, decreased strength, impaired UE functional use, and pain.   ACTIVITY LIMITATIONS: sleeping, bathing, dressing, self feeding, reach over head, and hygiene/grooming  PARTICIPATION LIMITATIONS: meal prep, cleaning, laundry, driving, shopping, community activity, occupation, and yard work  PERSONAL FACTORS: None   REHAB POTENTIAL: Excellent  CLINICAL DECISION MAKING: Stable/uncomplicated  EVALUATION COMPLEXITY: Low  GOALS:  SHORT TERM GOALS: Target date: 12/15/2022    Patient will increase passive L external rotation to 90 degrees Baseline: Goal status: ONGOING  2.   Patient will increase left shoulder passive flexion to 120 degrees Baseline:  Goal status: GOAL MET  3.  Patient will be independent with basic exercise program Baseline:  Goal status: GOAL MET  LONG TERM GOALS: Target date: 03/05/2023    Patient will reach to a shelf overhead without increased left shoulder pain Baseline:  Goal status: ONGOING  2.  Patient will be able to reach behind her head without increased pain in order to perform grooming tasks Baseline:  Goal status: ONGOING  3.  Patient will be independent with full exercise program including light gym activity Baseline:  Goal status: INITIAL  4.  Patient will reach behind her back to T12 without increased left shoulder pain in order to perform ADLs Baseline:  Goal status: ONGOING  5.  Pt will demo L shoulder AROM to be [facility] t/o for performance of ADLs and IADLs.   Goal status: INITIAL  6.  Pt will be able to perform  her ADLs and IADLs without significant difficulty and significant pain.  Goal status: INITIAL  PLAN: PT FREQUENCY: 2x/week  PT DURATION: 9 weeks  PLANNED INTERVENTIONS: Therapeutic exercises, Therapeutic activity, Neuromuscular re-education, Patient/Family education, Self Care, Joint mobilization, Aquatic Therapy, Dry Needling, Electrical stimulation, Cryotherapy, Moist heat, Taping, Ultrasound, and Manual therapy  PLAN FOR NEXT SESSION: Continue to progress patient per Dr. Suzan Nailer rotator cuff repair protocol and as tolerated. Consider DN if she has tightness. Patient is close to discharge.   Lorayne Bender PT DPT  01/26/23 10:09 AM  Cristal Ford SPT  01/22/2023  During this treatment session, the therapist was present, participating in and directing the treatment.

## 2023-01-28 ENCOUNTER — Ambulatory Visit (HOSPITAL_BASED_OUTPATIENT_CLINIC_OR_DEPARTMENT_OTHER): Payer: Commercial Managed Care - PPO | Admitting: Physical Therapy

## 2023-01-28 ENCOUNTER — Encounter (HOSPITAL_BASED_OUTPATIENT_CLINIC_OR_DEPARTMENT_OTHER): Payer: Self-pay | Admitting: Physical Therapy

## 2023-01-28 DIAGNOSIS — M25612 Stiffness of left shoulder, not elsewhere classified: Secondary | ICD-10-CM

## 2023-01-28 DIAGNOSIS — M6281 Muscle weakness (generalized): Secondary | ICD-10-CM

## 2023-01-28 DIAGNOSIS — M25512 Pain in left shoulder: Secondary | ICD-10-CM

## 2023-01-28 NOTE — Therapy (Signed)
OUTPATIENT PHYSICAL THERAPY TREATMENT NOTE   Patient Name: Rose Bell MRN: [id number] DOB:Apr 20, 1981, 42 y.o., female Today's Date: 01/29/2023  END OF SESSION:  PT End of Session - 01/28/23 1627     Visit Number 17    Number of Visits 28    Date for PT Re-Evaluation 03/05/23    Authorization Type [facility] [employer]    PT Start Time 1620    PT Stop Time 1655    PT Time Calculation (min) 35 min    Activity Tolerance Patient tolerated treatment well    Behavior During Therapy [facility] for tasks assessed/performed                      Past Medical History:  Diagnosis Date   Hyperlipemia    Hypertension    no current problems, no meds   Pneumonia    Seasonal allergies    Tear of left rotator cuff 10/2022   Past Surgical History:  Procedure Laterality Date   ARTHOSCOPIC ROTAOR CUFF REPAIR Left 11/10/2022   Procedure: LEFT SHOULDER ARTHROSCOPY WITH ROTATOR CUFF REPAIR;  Surgeon: Huel Cote, MD;  Location: [facility] OR;  Service: Orthopedics;  Laterality: Left;   BREAST BIOPSY Right    TUBAL LIGATION     Patient Active Problem List   Diagnosis Date Noted   Rotator cuff tear, left 10/22/2022   Elevated BP without diagnosis of hypertension 01/19/2022   Pneumonia due to COVID-19 virus 10/26/2019   Fracture of finger, middle phalanx, left, closed 09/26/2019   Orgasmic headache 01/08/2016   Obesity (BMI 30-39.9) 01/02/2015   Hyperlipidemia 08/10/2014   GERD (gastroesophageal reflux disease) 03/04/2012   General medical examination 06/26/2011   Screening for malignant neoplasm of the cervix 06/26/2011   Vitamin D deficiency 05/30/2009   Anxiety state 05/30/2009   B12 deficiency 05/23/2009    PCP: Dr Neena Rhymes MD    REFERRING PROVIDER: Dr Huel Cote   REFERRING DIAG: [phone] (ICD-10-CM) - Traumatic complete tear of left rotator cuff, initial encounter   THERAPY DIAG:  Stiffness of left shoulder, not elsewhere classified  Acute pain of left  shoulder  Muscle weakness (generalized)  Rationale for Evaluation and Treatment: Rehabilitation  ONSET DATE: 11/10/2022 L RTC repair  SAD and extensive debridement   Days since surgery: 79   SUBJECTIVE:                                                                                                                                                                                      SUBJECTIVE STATEMENT: Pt is 11 weeks and 2 days s/p RCR.  Pt denies any adverse effects after prior  Rx.  Pt reports compliance with HEP.  Pt denies pain currently.    PERTINENT HISTORY: HTN, RTC tear  PAIN:  NPRS scale: 0/10 currently, at worst 3/10  Pain location: left shoulder  Pain description: aching, soreness  Aggravating factors: use of the shoulder  Relieving factors: Adjusting her movements   PRECAUTIONS: Shoulder  WEIGHT BEARING RESTRICTIONS: Yes NWB  FALLS:  Has patient fallen in last 6 months? Yes. Number of falls 1  LIVING ENVIRONMENT: Nothing pertinent   OCCUPATION: Works for [employer]; Works at a [employer].   Hobbies: exercise    PLOF: Independent  PATIENT GOALS:  To have use of her arm    To be able to use her left arm   NEXT MD VISIT:   OBJECTIVE:   DIAGNOSTIC FINDINGS:  Noting post-op     TODAY'S TREATMENT:                                                                                                                                          Manual Therapy:  Gentle distraction with oscillation and grade II- III  Posterior and inf jt mobs to L GH joint to improve pain, stiffness, and to normalize arthrokinematics Pt received L shoulder flexion, abd, ER, and IR PROM in supine per pt and tissue tolerance within protocol limits.  Therapeutic Exercise: Pulley flexion and scaption 2x10  Supine serratus punch  x20 with 1# Supine shoulder ABC x 1 rep with 1# Sidelying ER 2x10 1 lb  Prone extension with 1# 3x10 Standing jobe's flexion 2x10 1lb Rows green band  2x10 Shoulder extension green band 2x10  Standing wand IR x10 reps Functional IR AROM 2x10 reps Standing wand ER 2x10     PATIENT EDUCATION: Education details:  Exercise form including home exercises.  PT instructed pt in using ice especially if having soreness with home exercises.  surgical protocol, exercise form, and POC.    Person educated: Patient Education method:  Explanation, Demonstration, Tactile cues, Verbal cues Education comprehension: verbalized understanding, returned demonstration, verbal cues required, tactile cues required, and needs further education  HOME EXERCISE PROGRAM: Access Code: [email or web address] URL: [email or web address] Date: 11/17/2022 Prepared by: Lorayne Benderavid Carroll     ASSESSMENT:  CLINICAL IMPRESSION: Patient is progressing well with shoulder ROM and strength.  Pt has reduced tightness in shoulder with PROM.  Pt is progressing appropriately with protocol.  She performed exercises well with cuing and instruction for correct form without c/o's.  She responded well to Rx having no pain after Rx.  She should cont to benefit from cont skilled PT services per protocol to address ongoing goals and to restore PLOF.    OBJECTIVE IMPAIRMENTS: decreased activity tolerance, decreased mobility, difficulty walking, decreased ROM, decreased strength, impaired UE functional use, and pain.   ACTIVITY LIMITATIONS: sleeping, bathing, dressing, self feeding, reach over head, and hygiene/grooming  PARTICIPATION LIMITATIONS: meal prep, cleaning, laundry, driving, shopping,  community activity, occupation, and yard work  PERSONAL FACTORS: None   REHAB POTENTIAL: Excellent  CLINICAL DECISION MAKING: Stable/uncomplicated  EVALUATION COMPLEXITY: Low  GOALS:  SHORT TERM GOALS: Target date: 12/15/2022    Patient will increase passive L external rotation to 90 degrees Baseline: Goal status: ONGOING  2.  Patient will increase left shoulder passive flexion to  120 degrees Baseline:  Goal status: GOAL MET  3.  Patient will be independent with basic exercise program Baseline:  Goal status: GOAL MET  LONG TERM GOALS: Target date: 03/05/2023    Patient will reach to a shelf overhead without increased left shoulder pain Baseline:  Goal status: ONGOING  2.  Patient will be able to reach behind her head without increased pain in order to perform grooming tasks Baseline:  Goal status: ONGOING  3.  Patient will be independent with full exercise program including light gym activity Baseline:  Goal status: INITIAL  4.  Patient will reach behind her back to T12 without increased left shoulder pain in order to perform ADLs Baseline:  Goal status: ONGOING  5.  Pt will demo L shoulder AROM to be [facility] t/o for performance of ADLs and IADLs.   Goal status: INITIAL  6.  Pt will be able to perform her ADLs and IADLs without significant difficulty and significant pain.  Goal status: INITIAL  PLAN: PT FREQUENCY: 2x/week  PT DURATION: 9 weeks  PLANNED INTERVENTIONS: Therapeutic exercises, Therapeutic activity, Neuromuscular re-education, Patient/Family education, Self Care, Joint mobilization, Aquatic Therapy, Dry Needling, Electrical stimulation, Cryotherapy, Moist heat, Taping, Ultrasound, and Manual therapy  PLAN FOR NEXT SESSION: Continue to progress patient per Dr. Suzan Nailer rotator cuff repair protocol     Audie Clear III PT, DPT 01/29/23 3:12 PM

## 2023-02-01 ENCOUNTER — Encounter: Payer: Self-pay | Admitting: Family Medicine

## 2023-02-01 ENCOUNTER — Ambulatory Visit (INDEPENDENT_AMBULATORY_CARE_PROVIDER_SITE_OTHER): Payer: Commercial Managed Care - PPO | Admitting: Family Medicine

## 2023-02-01 VITALS — BP 122/60 | HR 69 | Temp 98.1°F | Resp 17 | Ht 63.0 in | Wt 205.5 lb

## 2023-02-01 DIAGNOSIS — Z0000 Encounter for general adult medical examination without abnormal findings: Secondary | ICD-10-CM | POA: Diagnosis not present

## 2023-02-01 DIAGNOSIS — E559 Vitamin D deficiency, unspecified: Secondary | ICD-10-CM

## 2023-02-01 DIAGNOSIS — E669 Obesity, unspecified: Secondary | ICD-10-CM | POA: Diagnosis not present

## 2023-02-01 DIAGNOSIS — E538 Deficiency of other specified B group vitamins: Secondary | ICD-10-CM | POA: Diagnosis not present

## 2023-02-01 LAB — TSH: TSH: 2.6 u[IU]/mL (ref 0.35–5.50)

## 2023-02-01 LAB — CBC WITH DIFFERENTIAL/PLATELET
Basophils Absolute: 0 10*3/uL (ref 0.0–0.1)
Basophils Relative: 0.4 % (ref 0.0–3.0)
Eosinophils Absolute: 0.3 10*3/uL (ref 0.0–0.7)
Eosinophils Relative: 4.2 % (ref 0.0–5.0)
HCT: 38.5 % (ref 36.0–46.0)
Hemoglobin: 13 g/dL (ref 12.0–15.0)
Lymphocytes Relative: 39.3 % (ref 12.0–46.0)
Lymphs Abs: 2.4 10*3/uL (ref 0.7–4.0)
MCHC: 33.6 g/dL (ref 30.0–36.0)
MCV: 85.7 fl (ref 78.0–100.0)
Monocytes Absolute: 0.4 10*3/uL (ref 0.1–1.0)
Monocytes Relative: 6.9 % (ref 3.0–12.0)
Neutro Abs: 3 10*3/uL (ref 1.4–7.7)
Neutrophils Relative %: 49.2 % (ref 43.0–77.0)
Platelets: 278 10*3/uL (ref 150.0–400.0)
RBC: 4.5 Mil/uL (ref 3.87–5.11)
RDW: 14 % (ref 11.5–15.5)
WBC: 6.1 10*3/uL (ref 4.0–10.5)

## 2023-02-01 LAB — LIPID PANEL
Cholesterol: 194 mg/dL (ref 0–200)
HDL: 50 mg/dL (ref >39.00)
LDL Cholesterol: 120 mg/dL — ABNORMAL HIGH (ref 0–99)
NonHDL: 143.6
Total CHOL/HDL Ratio: 4
Triglycerides: 118 mg/dL (ref 0.0–149.0)
VLDL: 23.6 mg/dL (ref 0.0–40.0)

## 2023-02-01 LAB — HEPATIC FUNCTION PANEL
ALT: 13 U/L (ref 0–35)
AST: 14 U/L (ref 0–37)
Albumin: 4.4 g/dL (ref 3.5–5.2)
Alkaline Phosphatase: 66 U/L (ref 39–117)
Bilirubin, Direct: 0.1 mg/dL (ref 0.0–0.3)
Total Bilirubin: 0.4 mg/dL (ref 0.2–1.2)
Total Protein: 6.5 g/dL (ref 6.0–8.3)

## 2023-02-01 LAB — BASIC METABOLIC PANEL
BUN: 11 mg/dL (ref 6–23)
CO2: 27 mEq/L (ref 19–32)
Calcium: 9.2 mg/dL (ref 8.4–10.5)
Chloride: 104 mEq/L (ref 96–112)
Creatinine, Ser: 0.67 mg/dL (ref 0.40–1.20)
GFR: 106.79 mL/min (ref >60.00)
Glucose, Bld: 98 mg/dL (ref 70–99)
Potassium: 4.2 mEq/L (ref 3.5–5.1)
Sodium: 140 mEq/L (ref 135–145)

## 2023-02-01 LAB — VITAMIN D 25 HYDROXY (VIT D DEFICIENCY, FRACTURES): VITD: 21.51 ng/mL — ABNORMAL LOW (ref 30.00–100.00)

## 2023-02-01 LAB — B12 AND FOLATE PANEL
Folate: 8.2 ng/mL (ref >5.9)
Vitamin B-12: 1149 pg/mL — ABNORMAL HIGH (ref 211–911)

## 2023-02-01 NOTE — Assessment & Plan Note (Signed)
Ongoing issue for pt.  Weight and BMI are stable.  Encouraged healthy diet and regular exercise.  Check labs to risk stratify.  Will follow.

## 2023-02-01 NOTE — Assessment & Plan Note (Signed)
Check labs and replete prn. 

## 2023-02-01 NOTE — Progress Notes (Signed)
   Subjective:    Patient ID: Rose Bell, female    DOB: [birth year], 42 y.o.   MRN: [id number]  HPI CPE- UTD on mammo, Tdap.  Due to normal pap and negative HPV, pap not due until 2026  Patient Care Team    Relationship Specialty Notifications Start End  Sheliah Hatch, MD PCP - General   10/01/10     Health Maintenance  Topic Date Due   MAMMOGRAM  02/17/2023   INFLUENZA VACCINE  05/20/2023   DTaP/Tdap/Td (6 - Td or Tdap) 01/23/2024   PAP SMEAR-Modifier  12/05/2024   HIV Screening  Completed   HPV VACCINES  Aged Out   COVID-19 Vaccine  Discontinued   Hepatitis C Screening  Discontinued     Review of Systems Patient reports no vision/ hearing changes, adenopathy,fever, weight change,  persistant/recurrent hoarseness , swallowing issues, chest pain, palpitations, edema, persistant/recurrent cough, hemoptysis, dyspnea (rest/exertional/paroxysmal nocturnal), gastrointestinal bleeding (melena, rectal bleeding), abdominal pain, significant heartburn, bowel changes, GU symptoms (dysuria, hematuria, incontinence), Gyn symptoms (abnormal  bleeding, pain),  syncope, focal weakness, memory loss, numbness & tingling, skin/hair/nail changes, abnormal bruising or bleeding, anxiety, or depression.     Objective:   Physical Exam General Appearance:    Alert, cooperative, no distress, appears stated age, obese  Head:    Normocephalic, without obvious abnormality, atraumatic  Eyes:    PERRL, conjunctiva/corneas clear, EOM's intact both eyes  Ears:    Normal TM's and external ear canals, both ears  Nose:   Nares normal, septum midline, mucosa normal, no drainage    or sinus tenderness  Throat:   Lips, mucosa, and tongue normal; teeth and gums normal  Neck:   Supple, symmetrical, trachea midline, no adenopathy;    Thyroid: no enlargement/tenderness/nodules  Back:     Symmetric, no curvature, ROM normal, no CVA tenderness  Lungs:     Clear to auscultation bilaterally, respirations  unlabored  Chest Wall:    No tenderness or deformity   Heart:    Regular rate and rhythm, S1 and S2 normal, no murmur, rub   or gallop  Breast Exam:    Deferred to mammo  Abdomen:     Soft, non-tender, bowel sounds active all four quadrants,    no masses, no organomegaly  Genitalia:    Deferred  Rectal:    Extremities:   Extremities normal, atraumatic, no cyanosis or edema  Pulses:   2+ and symmetric all extremities  Skin:   Skin color, texture, turgor normal, no rashes or lesions  Lymph nodes:   Cervical, supraclavicular, and axillary nodes normal  Neurologic:   CNII-XII intact, normal strength, sensation and reflexes    throughout          Assessment & Plan:

## 2023-02-01 NOTE — Patient Instructions (Signed)
Follow up in 6 months to recheck cholesterol We'll notify you of your lab results and make any changes if needed Keep up the good work on healthy diet and regular exercise- you can do it!! Call with any questions or concerns Stay Safe!  Stay Healthy! Hang in there!!!  You are amazing!!!

## 2023-02-01 NOTE — Assessment & Plan Note (Signed)
Pt's PE WNL w/ exception of BMI.  UTD on pap, mammo, Tdap.  Check labs.  Anticipatory guidance provided.  

## 2023-02-02 ENCOUNTER — Other Ambulatory Visit: Payer: Self-pay

## 2023-02-02 ENCOUNTER — Telehealth: Payer: Self-pay

## 2023-02-02 ENCOUNTER — Encounter (HOSPITAL_BASED_OUTPATIENT_CLINIC_OR_DEPARTMENT_OTHER): Payer: Self-pay | Admitting: Physical Therapy

## 2023-02-02 ENCOUNTER — Ambulatory Visit (HOSPITAL_BASED_OUTPATIENT_CLINIC_OR_DEPARTMENT_OTHER): Payer: Commercial Managed Care - PPO | Admitting: Physical Therapy

## 2023-02-02 ENCOUNTER — Other Ambulatory Visit (HOSPITAL_BASED_OUTPATIENT_CLINIC_OR_DEPARTMENT_OTHER): Payer: Self-pay

## 2023-02-02 DIAGNOSIS — E559 Vitamin D deficiency, unspecified: Secondary | ICD-10-CM

## 2023-02-02 DIAGNOSIS — M6281 Muscle weakness (generalized): Secondary | ICD-10-CM | POA: Diagnosis not present

## 2023-02-02 DIAGNOSIS — M25612 Stiffness of left shoulder, not elsewhere classified: Secondary | ICD-10-CM | POA: Diagnosis not present

## 2023-02-02 DIAGNOSIS — M25512 Pain in left shoulder: Secondary | ICD-10-CM

## 2023-02-02 MED ORDER — VITAMIN D (ERGOCALCIFEROL) 1.25 MG (50000 UNIT) PO CAPS
50000.0000 [IU] | ORAL_CAPSULE | ORAL | 12 refills | Status: DC
  Filled 2023-02-02: qty 7, 49d supply, fill #0

## 2023-02-02 NOTE — Telephone Encounter (Signed)
-----   Message from Sheliah Hatch, MD sent at 02/02/2023  7:15 AM EDT ----- Labs look good w/ exception of low Vit D.  Based on this, we need to start 50,000 units weekly x12 weeks in addition to daily OTC supplement of at least 2000 units.

## 2023-02-02 NOTE — Telephone Encounter (Signed)
Pt aware of lab results and Vit D 50,000 unit has been sent in

## 2023-02-02 NOTE — Therapy (Signed)
OUTPATIENT PHYSICAL THERAPY TREATMENT NOTE   Patient Name: Rose Bell MRN: [id number] DOB:23-Nov-1980, 42 y.o., female Today's Date: 02/03/2023  END OF SESSION:  PT End of Session - 02/02/23 0809     Visit Number 18    Number of Visits 28    Date for PT Re-Evaluation 03/05/23    Authorization Type [facility] [employer]    PT Start Time [phone]    PT Stop Time [phone]    PT Time Calculation (min) 41 min    Activity Tolerance Patient tolerated treatment well    Behavior During Therapy [facility] for tasks assessed/performed                      Past Medical History:  Diagnosis Date   Hyperlipemia    Hypertension    no current problems, no meds   Pneumonia    Seasonal allergies    Tear of left rotator cuff 10/2022   Past Surgical History:  Procedure Laterality Date   ARTHOSCOPIC ROTAOR CUFF REPAIR Left 11/10/2022   Procedure: LEFT SHOULDER ARTHROSCOPY WITH ROTATOR CUFF REPAIR;  Surgeon: Huel Cote, MD;  Location: [facility] OR;  Service: Orthopedics;  Laterality: Left;   BREAST BIOPSY Right    SHOULDER SURGERY Left 11/10/2022   TUBAL LIGATION     Patient Active Problem List   Diagnosis Date Noted   Rotator cuff tear, left 10/22/2022   Elevated BP without diagnosis of hypertension 01/19/2022   Orgasmic headache 01/08/2016   Obesity (BMI 30-39.9) 01/02/2015   Hyperlipidemia 08/10/2014   General medical examination 06/26/2011   Screening for malignant neoplasm of the cervix 06/26/2011   Vitamin D deficiency 05/30/2009   Anxiety state 05/30/2009   B12 deficiency 05/23/2009    PCP: Dr Neena Rhymes MD    REFERRING PROVIDER: Dr Huel Cote   REFERRING DIAG: [phone] (ICD-10-CM) - Traumatic complete tear of left rotator cuff, initial encounter   THERAPY DIAG:  Stiffness of left shoulder, not elsewhere classified  Acute pain of left shoulder  Muscle weakness (generalized)  Rationale for Evaluation and Treatment: Rehabilitation  ONSET DATE: 11/10/2022 L RTC  repair  SAD and extensive debridement   Days since surgery: 84   SUBJECTIVE:                                                                                                                                                                                      SUBJECTIVE STATEMENT: Pt is 12 weeks s/p RCR.  Pt denies any adverse effects after prior Rx.  Pt reports compliance with HEP.  Pt denies pain currently.  Pt is improving with reaching though continues to report  tightness.  Pt states she is focusing on reducing shoulder hike with UE elevation.  Pt can wash her hair without difficulty.    PERTINENT HISTORY: HTN, RTC tear  PAIN:  NPRS scale: 0/10 currently, at worst 3/10  Pain location: left shoulder  Pain description: aching, soreness  Aggravating factors: use of the shoulder  Relieving factors: Adjusting her movements   PRECAUTIONS: Shoulder  WEIGHT BEARING RESTRICTIONS: Yes NWB  FALLS:  Has patient fallen in last 6 months? Yes. 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   Hobbies: exercise    PLOF: Independent  PATIENT GOALS:  To have use of her arm    To be able to use her left arm   NEXT MD VISIT:   OBJECTIVE:   DIAGNOSTIC FINDINGS:  Noting post-op     TODAY'S TREATMENT:                                                                                                                                          Manual Therapy:  Gentle distraction with oscillation and grade II- III  Posterior and inf jt mobs to L GH joint to improve pain, stiffness, and to normalize arthrokinematics Pt received L shoulder flexion, abd, ER, and IR PROM in supine per pt and tissue tolerance within protocol limits.  Therapeutic Exercise: Pulley flexion and scaption 2x10  Supine serratus punch  x10 each with 1# and 2# Supine shoulder ABC x 1 rep with 1# Sidelying ER x12 with 1# and x10 with 2 lb  Prone extension  with 2# 2x10 Shelf reach x 10 reps Standing jobe's flexion 2x10 1lb Standing scaption 2x10 Rows green band 2x10 Shoulder extension green band 2x10  ER with arm at side with RTB 2x10 IR with arm at side with GTB 2x10 Standing wand IR x10 reps Functional IR AROM 2x10 reps      PATIENT EDUCATION: Education details:  Exercise form including home exercises.  PT instructed pt in using ice especially if having soreness with home exercises.  surgical protocol, exercise form, and POC.    Person educated: Patient Education method:  Explanation, Demonstration, Tactile cues, Verbal cues Education comprehension: verbalized understanding, returned demonstration, verbal cues required, tactile cues required, and needs further education  HOME EXERCISE PROGRAM: Access Code: [email or web address] URL: [email or web address] Date: 11/17/2022 Prepared by: Lorayne Bender    ASSESSMENT:  CLINICAL IMPRESSION: Pt is improving with ROM t/o L shoulder though does have tightness in L shoulder which improves with increased reps of PROM.  Pt is improving with function including being able to wash her hair without difficulty.  Pt is progressing appropriately with protocol.  She is improving with strength as evidenced by performance and progression of exercises per protocol.  She performed exercises well with cuing and instruction for correct form without c/o's.  She responded well  to Rx having no pain after Rx.  She should cont to benefit from cont skilled PT services per protocol to address ongoing goals and to restore PLOF.     OBJECTIVE IMPAIRMENTS: decreased activity tolerance, decreased mobility, difficulty walking, decreased ROM, decreased strength, impaired UE functional use, and pain.   ACTIVITY LIMITATIONS: sleeping, bathing, dressing, self feeding, reach over head, and hygiene/grooming  PARTICIPATION LIMITATIONS: meal prep, cleaning, laundry, driving, shopping, community activity, occupation, and  yard work  PERSONAL FACTORS: None   REHAB POTENTIAL: Excellent  CLINICAL DECISION MAKING: Stable/uncomplicated  EVALUATION COMPLEXITY: Low  GOALS:  SHORT TERM GOALS: Target date: 12/15/2022    Patient will increase passive L external rotation to 90 degrees Baseline: Goal status: ONGOING  2.  Patient will increase left shoulder passive flexion to 120 degrees Baseline:  Goal status: GOAL MET  3.  Patient will be independent with basic exercise program Baseline:  Goal status: GOAL MET  LONG TERM GOALS: Target date: 03/05/2023    Patient will reach to a shelf overhead without increased left shoulder pain Baseline:  Goal status: ONGOING  2.  Patient will be able to reach behind her head without increased pain in order to perform grooming tasks Baseline:  Goal status: ONGOING  3.  Patient will be independent with full exercise program including light gym activity Baseline:  Goal status: INITIAL  4.  Patient will reach behind her back to T12 without increased left shoulder pain in order to perform ADLs Baseline:  Goal status: ONGOING  5.  Pt will demo L shoulder AROM to be [facility] t/o for performance of ADLs and IADLs.   Goal status: INITIAL  6.  Pt will be able to perform her ADLs and IADLs without significant difficulty and significant pain.  Goal status: INITIAL  PLAN: PT FREQUENCY: 2x/week  PT DURATION: 9 weeks  PLANNED INTERVENTIONS: Therapeutic exercises, Therapeutic activity, Neuromuscular re-education, Patient/Family education, Self Care, Joint mobilization, Aquatic Therapy, Dry Needling, Electrical stimulation, Cryotherapy, Moist heat, Taping, Ultrasound, and Manual therapy  PLAN FOR NEXT SESSION: Continue to progress patient per Dr. Suzan Nailer rotator cuff repair protocol     Audie Clear III PT, DPT 02/03/23 2:16 PM

## 2023-02-05 ENCOUNTER — Encounter (HOSPITAL_BASED_OUTPATIENT_CLINIC_OR_DEPARTMENT_OTHER): Payer: Self-pay | Admitting: Physical Therapy

## 2023-02-05 ENCOUNTER — Ambulatory Visit (INDEPENDENT_AMBULATORY_CARE_PROVIDER_SITE_OTHER): Payer: Commercial Managed Care - PPO | Admitting: Orthopaedic Surgery

## 2023-02-05 ENCOUNTER — Ambulatory Visit (HOSPITAL_BASED_OUTPATIENT_CLINIC_OR_DEPARTMENT_OTHER): Payer: Commercial Managed Care - PPO | Admitting: Physical Therapy

## 2023-02-05 DIAGNOSIS — M25512 Pain in left shoulder: Secondary | ICD-10-CM | POA: Diagnosis not present

## 2023-02-05 DIAGNOSIS — M6281 Muscle weakness (generalized): Secondary | ICD-10-CM | POA: Diagnosis not present

## 2023-02-05 DIAGNOSIS — M25612 Stiffness of left shoulder, not elsewhere classified: Secondary | ICD-10-CM

## 2023-02-05 DIAGNOSIS — S46012A Strain of muscle(s) and tendon(s) of the rotator cuff of left shoulder, initial encounter: Secondary | ICD-10-CM

## 2023-02-05 NOTE — Therapy (Signed)
OUTPATIENT PHYSICAL THERAPY TREATMENT NOTE   Patient Name: Rose Bell MRN: [id number] DOB:Jan 28, 1981, 42 y.o., female Today's Date: 02/05/2023  END OF SESSION:  PT End of Session - 02/05/23 0807     Visit Number 19    Number of Visits 28    Date for PT Re-Evaluation 03/05/23    Authorization Type [facility] [employer]    PT Start Time 0802    Activity Tolerance Patient tolerated treatment well    Behavior During Therapy [facility] for tasks assessed/performed                      Past Medical History:  Diagnosis Date   Hyperlipemia    Hypertension    no current problems, no meds   Pneumonia    Seasonal allergies    Tear of left rotator cuff 10/2022   Past Surgical History:  Procedure Laterality Date   ARTHOSCOPIC ROTAOR CUFF REPAIR Left 11/10/2022   Procedure: LEFT SHOULDER ARTHROSCOPY WITH ROTATOR CUFF REPAIR;  Surgeon: Huel Cote, MD;  Location: [facility] OR;  Service: Orthopedics;  Laterality: Left;   BREAST BIOPSY Right    SHOULDER SURGERY Left 11/10/2022   TUBAL LIGATION     Patient Active Problem List   Diagnosis Date Noted   Rotator cuff tear, left 10/22/2022   Elevated BP without diagnosis of hypertension 01/19/2022   Orgasmic headache 01/08/2016   Obesity (BMI 30-39.9) 01/02/2015   Hyperlipidemia 08/10/2014   General medical examination 06/26/2011   Screening for malignant neoplasm of the cervix 06/26/2011   Vitamin D deficiency 05/30/2009   Anxiety state 05/30/2009   B12 deficiency 05/23/2009    PCP: Dr Neena Rhymes MD    REFERRING PROVIDER: Dr Huel Cote   REFERRING DIAG: [phone] (ICD-10-CM) - Traumatic complete tear of left rotator cuff, initial encounter   THERAPY DIAG:  Stiffness of left shoulder, not elsewhere classified  Muscle weakness (generalized)  Rationale for Evaluation and Treatment: Rehabilitation  ONSET DATE: 11/10/2022 L RTC repair  SAD and extensive debridement   Days since surgery: 87   SUBJECTIVE:                                                                                                                                                                                       SUBJECTIVE STATEMENT: Pt reports no left shoulder pain at entry of therapy. Pt states that her neck is feeling tight. Pt reports it is the same tightness she experienced prior to surgery. Pt reports feeling constant tension in her left upper traps, has noticed it the past couple of days but today it was a little worse.   PERTINENT  HISTORY: HTN, RTC tear  PAIN:  NPRS scale: 0/10 currently, at worst 3/10  Pain location: left shoulder  Pain description: aching, soreness  Aggravating factors: use of the shoulder  Relieving factors: Adjusting her movements   PRECAUTIONS: Shoulder  WEIGHT BEARING RESTRICTIONS: Yes NWB  FALLS:  Has patient fallen in last 6 months? Yes. 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   Hobbies: exercise    PLOF: Independent  PATIENT GOALS:  To have use of her arm    To be able to use her left arm   NEXT MD VISIT:   OBJECTIVE:   DIAGNOSTIC FINDINGS:  Noting post-op     TODAY'S TREATMENT:                                                                                                                                          4/19 Pulley shoulder flexion 2x10 PROM shoulder flexion  STM left upper trap Supine shoulder ABC x1 rep 1lb Sidelying ER x12 1lb, 2x8 2lbs Standing flexion x10 no weight, 2x8 1lbs Rows w/ green band x10, Rows w/ blue band 2x8 Shoulder Extension w/ green x10, rows w/ blue band 2x8 Ball rolls against wall up down, side to side, clockwise, counterclockwise x20 each. Standing IR w/ wand 2x10   Last visit:  Manual Therapy:  Gentle distraction with oscillation and grade II- III  Posterior and inf jt mobs to L GH joint to improve pain, stiffness, and to normalize arthrokinematics Pt  received L shoulder flexion, abd, ER, and IR PROM in supine per pt and tissue tolerance within protocol limits.  Therapeutic Exercise: Pulley flexion and scaption 2x10  Supine serratus punch  x10 each with 1# and 2# Supine shoulder ABC x 1 rep with 1# Sidelying ER x12 with 1# and x10 with 2 lb  Prone extension with 2# 2x10 Shelf reach x 10 reps Standing jobe's flexion 2x10 1lb Standing scaption 2x10 Rows green band 2x10 Shoulder extension green band 2x10  ER with arm at side with RTB 2x10 IR with arm at side with GTB 2x10 Standing wand IR x10 reps Functional IR AROM 2x10 reps      PATIENT EDUCATION: Education details:  Exercise form including home exercises.  PT instructed pt in using ice especially if having soreness with home exercises.  surgical protocol, exercise form, and POC.    Person educated: Patient Education method:  Explanation, Demonstration, Tactile cues, Verbal cues Education comprehension: verbalized understanding, returned demonstration, verbal cues required, tactile cues required, and needs further education  HOME EXERCISE PROGRAM: Access Code: [email or web address] URL: [email or web address] Date: 11/17/2022 Prepared by: Lorayne Bender    ASSESSMENT:  CLINICAL IMPRESSION: Pt is improving with L shoulder ROM but reports feeling tight in the anterior shoulder when reaching behind her head and behind her back. Patient also reports with tight  upper left trap, states a "constant tension".  Patient ROM is WFL. Patient was able to progress to blue bands for rows and shoulder extension. Pt reports no problems with ADLs at home. Pt is progressing appropriately with protocol. She performed exercises well with proper form and technique without pain. Patient will be seeing her doctor today, but we will continue to progress her as tolerated and protocol. Patient was informed to continue to work on HEP.    OBJECTIVE IMPAIRMENTS: decreased activity tolerance, decreased  mobility, difficulty walking, decreased ROM, decreased strength, impaired UE functional use, and pain.   ACTIVITY LIMITATIONS: sleeping, bathing, dressing, self feeding, reach over head, and hygiene/grooming  PARTICIPATION LIMITATIONS: meal prep, cleaning, laundry, driving, shopping, community activity, occupation, and yard work  PERSONAL FACTORS: None   REHAB POTENTIAL: Excellent  CLINICAL DECISION MAKING: Stable/uncomplicated  EVALUATION COMPLEXITY: Low  GOALS:  SHORT TERM GOALS: Target date: 12/15/2022    Patient will increase passive L external rotation to 90 degrees Baseline: Goal status: ONGOING  2.  Patient will increase left shoulder passive flexion to 120 degrees Baseline:  Goal status: GOAL MET  3.  Patient will be independent with basic exercise program Baseline:  Goal status: GOAL MET  LONG TERM GOALS: Target date: 03/05/2023    Patient will reach to a shelf overhead without increased left shoulder pain Baseline:  Goal status: ONGOING  2.  Patient will be able to reach behind her head without increased pain in order to perform grooming tasks Baseline:  Goal status: ONGOING  3.  Patient will be independent with full exercise program including light gym activity Baseline:  Goal status: INITIAL  4.  Patient will reach behind her back to T12 without increased left shoulder pain in order to perform ADLs Baseline:  Goal status: ONGOING  5.  Pt will demo L shoulder AROM to be [facility] t/o for performance of ADLs and IADLs.   Goal status: INITIAL  6.  Pt will be able to perform her ADLs and IADLs without significant difficulty and significant pain.  Goal status: INITIAL  PLAN: PT FREQUENCY: 2x/week  PT DURATION: 9 weeks  PLANNED INTERVENTIONS: Therapeutic exercises, Therapeutic activity, Neuromuscular re-education, Patient/Family education, Self Care, Joint mobilization, Aquatic Therapy, Dry Needling, Electrical stimulation, Cryotherapy, Moist heat, Taping,  Ultrasound, and Manual therapy  PLAN FOR NEXT SESSION: Progress patient band exercise, consider STM or TPDN to left upper trap.    Lorayne Bender PT DPT  02/05/23 8:08 AM  Cristal Ford SPT  During this treatment session, the therapist was present, participating in and directing the treatment.   I have reviewed and concur with this student's documentation.

## 2023-02-05 NOTE — Therapy (Signed)
OUTPATIENT PHYSICAL THERAPY TREATMENT NOTE   Patient Name: Rose Bell MRN: [id number] DOB:01-21-1981, 42 y.o., female Today's Date: 02/05/2023  END OF SESSION:  PT End of Session - 02/05/23 0807     Visit Number 19    Number of Visits 28    Date for PT Re-Evaluation 03/05/23    Authorization Type [facility] [employer]    PT Start Time 0802    PT Stop Time [phone]    PT Time Calculation (min) 41 min    Activity Tolerance Patient tolerated treatment well    Behavior During Therapy [facility] for tasks assessed/performed                      Past Medical History:  Diagnosis Date   Hyperlipemia    Hypertension    no current problems, no meds   Pneumonia    Seasonal allergies    Tear of left rotator cuff 10/2022   Past Surgical History:  Procedure Laterality Date   ARTHOSCOPIC ROTAOR CUFF REPAIR Left 11/10/2022   Procedure: LEFT SHOULDER ARTHROSCOPY WITH ROTATOR CUFF REPAIR;  Surgeon: Huel Cote, MD;  Location: [facility] OR;  Service: Orthopedics;  Laterality: Left;   BREAST BIOPSY Right    SHOULDER SURGERY Left 11/10/2022   TUBAL LIGATION     Patient Active Problem List   Diagnosis Date Noted   Rotator cuff tear, left 10/22/2022   Elevated BP without diagnosis of hypertension 01/19/2022   Orgasmic headache 01/08/2016   Obesity (BMI 30-39.9) 01/02/2015   Hyperlipidemia 08/10/2014   General medical examination 06/26/2011   Screening for malignant neoplasm of the cervix 06/26/2011   Vitamin D deficiency 05/30/2009   Anxiety state 05/30/2009   B12 deficiency 05/23/2009    PCP: Dr Neena Rhymes MD    REFERRING PROVIDER: Dr Huel Cote   REFERRING DIAG: [phone] (ICD-10-CM) - Traumatic complete tear of left rotator cuff, initial encounter   THERAPY DIAG:  Stiffness of left shoulder, not elsewhere classified  Muscle weakness (generalized)  Rationale for Evaluation and Treatment: Rehabilitation  ONSET DATE: 11/10/2022 L RTC repair  SAD and extensive  debridement   Days since surgery: 87   SUBJECTIVE:                                                                                                                                                                                      SUBJECTIVE STATEMENT: Pt reports no left shoulder pain at entry of therapy. Pt states that her neck is feeling tight. Pt reports it is the same tightness she experienced prior to surgery. Pt reports feeling constant tension in her left upper traps, has noticed  it the past couple of days but today it was a little worse.   PERTINENT HISTORY: HTN, RTC tear  PAIN:  NPRS scale: 0/10 currently, at worst 3/10  Pain location: left shoulder  Pain description: aching, soreness  Aggravating factors: use of the shoulder  Relieving factors: Adjusting her movements   PRECAUTIONS: Shoulder  WEIGHT BEARING RESTRICTIONS: Yes NWB  FALLS:  Has patient fallen in last 6 months? Yes. 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   Hobbies: exercise    PLOF: Independent  PATIENT GOALS:  To have use of her arm    To be able to use her left arm   NEXT MD VISIT:   OBJECTIVE:   DIAGNOSTIC FINDINGS:  Noting post-op     TODAY'S TREATMENT:                                                                                                                                          4/19 Pulley shoulder flexion 2x10 PROM shoulder flexion  STM left upper trap Supine shoulder ABC x1 rep 1lb Sidelying ER x12 1lb, 2x8 2lbs Standing flexion x10 no weight, 2x8 1lbs Rows w/ green band x10, Rows w/ blue band 2x8 Shoulder Extension w/ green x10, rows w/ blue band 2x8 Ball rolls against wall up down, side to side, clockwise, counterclockwise x20 each. Standing IR w/ wand 2x10   Last visit:  Manual Therapy:  Gentle distraction with oscillation and grade II- III  Posterior and inf jt mobs to L GH joint to  improve pain, stiffness, and to normalize arthrokinematics Pt received L shoulder flexion, abd, ER, and IR PROM in supine per pt and tissue tolerance within protocol limits.  Therapeutic Exercise: Pulley flexion and scaption 2x10  Supine serratus punch  x10 each with 1# and 2# Supine shoulder ABC x 1 rep with 1# Sidelying ER x12 with 1# and x10 with 2 lb  Prone extension with 2# 2x10 Shelf reach x 10 reps Standing jobe's flexion 2x10 1lb Standing scaption 2x10 Rows green band 2x10 Shoulder extension green band 2x10  ER with arm at side with RTB 2x10 IR with arm at side with GTB 2x10 Standing wand IR x10 reps Functional IR AROM 2x10 reps      PATIENT EDUCATION: Education details:  Exercise form including home exercises.  PT instructed pt in using ice especially if having soreness with home exercises.  surgical protocol, exercise form, and POC.    Person educated: Patient Education method:  Explanation, Demonstration, Tactile cues, Verbal cues Education comprehension: verbalized understanding, returned demonstration, verbal cues required, tactile cues required, and needs further education  HOME EXERCISE PROGRAM: Access Code: [email or web address] URL: [email or web address] Date: 11/17/2022 Prepared by: Lorayne Bender    ASSESSMENT:  CLINICAL IMPRESSION: Pt is improving with L shoulder ROM but reports feeling tight in the  anterior shoulder when reaching behind her head and behind her back. Patient also reports with tight upper left trap, states a "constant tension".  Patient ROM is WFL. Patient was able to progress to blue bands for rows and shoulder extension. Pt reports no problems with ADLs at home. Pt is progressing appropriately with protocol. She performed exercises well with proper form and technique without pain. Patient will be seeing her doctor today, but we will continue to progress her as tolerated and protocol. Patient was informed to continue to work on HEP.     OBJECTIVE IMPAIRMENTS: decreased activity tolerance, decreased mobility, difficulty walking, decreased ROM, decreased strength, impaired UE functional use, and pain.   ACTIVITY LIMITATIONS: sleeping, bathing, dressing, self feeding, reach over head, and hygiene/grooming  PARTICIPATION LIMITATIONS: meal prep, cleaning, laundry, driving, shopping, community activity, occupation, and yard work  PERSONAL FACTORS: None   REHAB POTENTIAL: Excellent  CLINICAL DECISION MAKING: Stable/uncomplicated  EVALUATION COMPLEXITY: Low  GOALS:  SHORT TERM GOALS: Target date: 12/15/2022    Patient will increase passive L external rotation to 90 degrees Baseline: Goal status: ONGOING  2.  Patient will increase left shoulder passive flexion to 120 degrees Baseline:  Goal status: GOAL MET  3.  Patient will be independent with basic exercise program Baseline:  Goal status: GOAL MET  LONG TERM GOALS: Target date: 03/05/2023    Patient will reach to a shelf overhead without increased left shoulder pain Baseline:  Goal status: ONGOING  2.  Patient will be able to reach behind her head without increased pain in order to perform grooming tasks Baseline:  Goal status: ONGOING  3.  Patient will be independent with full exercise program including light gym activity Baseline:  Goal status: INITIAL  4.  Patient will reach behind her back to T12 without increased left shoulder pain in order to perform ADLs Baseline:  Goal status: ONGOING  5.  Pt will demo L shoulder AROM to be [facility] t/o for performance of ADLs and IADLs.   Goal status: INITIAL  6.  Pt will be able to perform her ADLs and IADLs without significant difficulty and significant pain.  Goal status: INITIAL  PLAN: PT FREQUENCY: 2x/week  PT DURATION: 9 weeks  PLANNED INTERVENTIONS: Therapeutic exercises, Therapeutic activity, Neuromuscular re-education, Patient/Family education, Self Care, Joint mobilization, Aquatic Therapy,  Dry Needling, Electrical stimulation, Cryotherapy, Moist heat, Taping, Ultrasound, and Manual therapy  PLAN FOR NEXT SESSION: Progress patient band exercise, consider STM or TPDN to left upper trap.    During this treatment session, the therapist was present, participating in and directing the treatment.  Cristal Ford, SPT 02/05/23 11:29 AM

## 2023-02-05 NOTE — Progress Notes (Signed)
Post Operative Evaluation    Procedure/Date of Surgery: Left shoulder rotator cuff repair 1/23  Interval History:   Presents today status post left shoulder overall doing extremely well.  Range of motion is improving.  She is working on Print production planner with physical therapy.  There is some soreness after physical therapy although she does continue to improve.   PMH/PSH/Family History/Social History/Meds/Allergies:    Past Medical History:  Diagnosis Date   Hyperlipemia    Hypertension    no current problems, no meds   Pneumonia    Seasonal allergies    Tear of left rotator cuff 10/2022   Past Surgical History:  Procedure Laterality Date   ARTHOSCOPIC ROTAOR CUFF REPAIR Left 11/10/2022   Procedure: LEFT SHOULDER ARTHROSCOPY WITH ROTATOR CUFF REPAIR;  Surgeon: Huel Cote, MD;  Location: [facility] OR;  Service: Orthopedics;  Laterality: Left;   BREAST BIOPSY Right    SHOULDER SURGERY Left 11/10/2022   TUBAL LIGATION     Social History   Socioeconomic History   Marital status: Married    Spouse name: Not on file   Number of children: Not on file   Years of education: Not on file   Highest education level: Not on file  Occupational History   Not on file  Tobacco Use   Smoking status: Never   Smokeless tobacco: Never  Vaping Use   Vaping Use: Never used  Substance and Sexual Activity   Alcohol use: Yes    Comment: socially   Drug use: No   Sexual activity: Yes    Birth control/protection: Surgical    Comment: Tubal Ligation  Other Topics Concern   Not on file  Social History Narrative   Not on file   Social Determinants of Health   Financial Resource Strain: Not on file  Food Insecurity: Not on file  Transportation Needs: Not on file  Physical Activity: Not on file  Stress: Not on file  Social Connections: Not on file   Family History  Problem Relation Age of Onset   Diabetes Mother    Hypertension Mother     Hypothyroidism Mother    Fibroids Mother    Heart disease Father        cardiomyopathy   Other Father        Cardio Megaly   Heart disease Paternal Uncle    Diabetes Other        father side of family   Breast cancer Neg Hx    Allergies  Allergen Reactions   Phentermine Swelling    After 3 days pt says tongue swells and was having trouble with speech   Buspirone Swelling   Bupropion Other (See Comments)    Unknown reaction    Statins Anxiety   Current Outpatient Medications  Medication Sig Dispense Refill   Vitamin D, Ergocalciferol, (DRISDOL) 1.25 MG (50000 UNIT) CAPS capsule Take 1 capsule (50,000 Units total) by mouth every 7 (seven) days. 7 capsule 12   Biotin 5000 MCG TABS Take 5,000 mcg by mouth daily.     cyanocobalamin 2000 MCG tablet Take 2,000 mcg by mouth daily.     loratadine (CLARITIN) 10 MG tablet Take 10 mg by mouth daily.     rosuvastatin (CRESTOR) 10 MG tablet Take 1 tablet (10 mg total) by mouth daily. 90 tablet 0  No current facility-administered medications for this visit.   No results found.  Review of Systems:   A ROS was performed including pertinent positives and negatives as documented in the HPI.   Musculoskeletal Exam:    Left shoulder active forward elevation in the standing position is to 150 degrees which is equal to the contralateral side, external rotation at the side is to 50 which is also equal to contralateral side.  Internal rotation is to T12 bilaterally.  Some weakness with forward elevation compared to the contralateral side.  Distal neurovascular exam intact  Imaging:      I personally reviewed and interpreted the radiographs.   Assessment:   12 weeks status post left shoulder rotator cuff repair overall doing extremely well.  At this time I would like her to continue to work on strengthening of the shoulder.  She will continue to advance according to the rotator cuff repair protocol.  I will plan to see her back in 3 months for  likely final check Plan :    -Return to clinic in 12 weeks for reassessment      I personally saw and evaluated the patient, and participated in the management and treatment plan.  Huel Cote, MD Attending Physician, Orthopedic Surgery  This document was dictated using Dragon voice recognition software. A reasonable attempt at proof reading has been made to minimize errors.

## 2023-02-12 ENCOUNTER — Ambulatory Visit (HOSPITAL_BASED_OUTPATIENT_CLINIC_OR_DEPARTMENT_OTHER): Payer: Commercial Managed Care - PPO | Admitting: Physical Therapy

## 2023-02-12 DIAGNOSIS — M25612 Stiffness of left shoulder, not elsewhere classified: Secondary | ICD-10-CM

## 2023-02-12 DIAGNOSIS — M6281 Muscle weakness (generalized): Secondary | ICD-10-CM | POA: Diagnosis not present

## 2023-02-12 DIAGNOSIS — M25512 Pain in left shoulder: Secondary | ICD-10-CM

## 2023-02-12 NOTE — Therapy (Signed)
OUTPATIENT PHYSICAL THERAPY TREATMENT NOTE   Patient Name: Rose Bell MRN: [id number] DOB:23-May-1981, 42 y.o., female Today's Date: 02/12/2023  END OF SESSION:  PT End of Session - 02/12/23 0940     Visit Number 20    Number of Visits 28    Date for PT Re-Evaluation 03/05/23    Authorization Type [employer]    PT Start Time [phone]    PT Stop Time 1014    PT Time Calculation (min) 43 min    Activity Tolerance Patient tolerated treatment well    Behavior During Therapy [facility] for tasks assessed/performed                      Past Medical History:  Diagnosis Date   Hyperlipemia    Hypertension    no current problems, no meds   Pneumonia    Seasonal allergies    Tear of left rotator cuff 10/2022   Past Surgical History:  Procedure Laterality Date   ARTHOSCOPIC ROTAOR CUFF REPAIR Left 11/10/2022   Procedure: LEFT SHOULDER ARTHROSCOPY WITH ROTATOR CUFF REPAIR;  Surgeon: Huel Cote, MD;  Location: [facility] OR;  Service: Orthopedics;  Laterality: Left;   BREAST BIOPSY Right    SHOULDER SURGERY Left 11/10/2022   TUBAL LIGATION     Patient Active Problem List   Diagnosis Date Noted   Rotator cuff tear, left 10/22/2022   Elevated BP without diagnosis of hypertension 01/19/2022   Orgasmic headache 01/08/2016   Obesity (BMI 30-39.9) 01/02/2015   Hyperlipidemia 08/10/2014   General medical examination 06/26/2011   Screening for malignant neoplasm of the cervix 06/26/2011   Vitamin D deficiency 05/30/2009   Anxiety state 05/30/2009   B12 deficiency 05/23/2009    PCP: Dr Neena Rhymes MD    REFERRING PROVIDER: Dr Huel Cote   REFERRING DIAG: [phone] (ICD-10-CM) - Traumatic complete tear of left rotator cuff, initial encounter   THERAPY DIAG:  No diagnosis found.  Rationale for Evaluation and Treatment: Rehabilitation  ONSET DATE: 11/10/2022 L RTC repair  SAD and extensive debridement   Days since surgery: 94   SUBJECTIVE:                                                                                                                                                                                       SUBJECTIVE STATEMENT: Patient is still experiencing tightness in her left upper trap. She reports it is giving her some trouble in her neck.   PERTINENT HISTORY: HTN, RTC tear  PAIN:  NPRS scale: 0/10 currently, at worst 3/10  Pain location: left shoulder  Pain description: aching, soreness  Aggravating  factors: use of the shoulder  Relieving factors: Adjusting her movements   PRECAUTIONS: Shoulder  WEIGHT BEARING RESTRICTIONS: Yes NWB  FALLS:  Has patient fallen in last 6 months? Yes. 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   Hobbies: exercise    PLOF: Independent  PATIENT GOALS:  To have use of her arm    To be able to use her left arm   NEXT MD VISIT:   OBJECTIVE:   DIAGNOSTIC FINDINGS:  Noting post-op     TODAY'S TREATMENT:                                                                                                                                          4/26 TPDN left upper trap ER red band 3x10 Shoulder horizontal abduction red band 3x10 Diagonal red band x5 (still challenging, revisit nex)  Supine serratus punch  x10 each with 1# and 2#  Sidelying ER x10 1lbs, 2x8 2lbs  Rows 3x10 blue band Green band 3x10  Shelf reach x10 1lb(a little difficult), x10 no weight Rows green x10, blue band 2x8  Shoulder extension green x10, blue band 2x8 Ball rolls against wall up down, side to side, clockwise, counterclockwise x10 each.   Trigger Point Dry-Needling  Treatment instructions: Expect mild to moderate muscle soreness. S/S of pneumothorax if dry needled over a lung field, and to seek immediate medical attention should they occur. Patient verbalized understanding of these instructions and education.  Patient Consent  Given: No Education handout provided: No Muscles treated: upper trap left 2x .30x50 needle  Electrical stimulation performed: No Parameters: N/A Treatment response/outcome: great twitch   Manual: trigger point to upper trap   4/19 Pulley shoulder flexion 2x10 PROM shoulder flexion  STM left upper trap Supine shoulder ABC x1 rep 1lb Sidelying ER x12 1lb, 2x8 2lbs Standing flexion x10 no weight, 2x8 1lbs Rows w/ green band x10, Rows w/ blue band 2x8 Shoulder Extension w/ green x10, rows w/ blue band 2x8 Ball rolls against wall up down, side to side, clockwise, counterclockwise x20 each. Standing IR w/ wand 2x10   Last visit:  Manual Therapy:  Gentle distraction with oscillation and grade II- III  Posterior and inf jt mobs to L GH joint to improve pain, stiffness, and to normalize arthrokinematics Pt received L shoulder flexion, abd, ER, and IR PROM in supine per pt and tissue tolerance within protocol limits.  Therapeutic Exercise: Pulley flexion and scaption 2x10  Supine serratus punch  x10 each with 1# and 2# Supine shoulder ABC x 1 rep with 1# Sidelying ER x12 with 1# and x10 with 2 lb  Prone extension with 2# 2x10 Shelf reach x 10 reps Standing jobe's flexion 2x10 1lb Standing scaption 2x10 Rows green band 2x10 Shoulder extension green band 2x10  ER with arm at side  with RTB 2x10 IR with arm at side with GTB 2x10 Standing wand IR x10 reps Functional IR AROM 2x10 reps      PATIENT EDUCATION: Education details:  Exercise form including home exercises.  PT instructed pt in using ice especially if having soreness with home exercises.  surgical protocol, exercise form, and POC.    Person educated: Patient Education method:  Explanation, Demonstration, Tactile cues, Verbal cues Education comprehension: verbalized understanding, returned demonstration, verbal cues required, tactile cues required, and needs further education  HOME EXERCISE PROGRAM: Access Code:  [email or web address] URL: [email or web address] Date: 11/17/2022 Prepared by: Lorayne Bender    ASSESSMENT:  CLINICAL IMPRESSION:  Therapy performed a trial of TPDN on the upper trap on the left today. She had a great twitch response. She reported feeling it was looser right away. We reviewed post needle soreness and how to combat that tomorrow. We will continue to progress exercises as tolerated.   OBJECTIVE IMPAIRMENTS: decreased activity tolerance, decreased mobility, difficulty walking, decreased ROM, decreased strength, impaired UE functional use, and pain.   ACTIVITY LIMITATIONS: sleeping, bathing, dressing, self feeding, reach over head, and hygiene/grooming  PARTICIPATION LIMITATIONS: meal prep, cleaning, laundry, driving, shopping, community activity, occupation, and yard work  PERSONAL FACTORS: None   REHAB POTENTIAL: Excellent  CLINICAL DECISION MAKING: Stable/uncomplicated  EVALUATION COMPLEXITY: Low  GOALS:  SHORT TERM GOALS: Target date: 12/15/2022    Patient will increase passive L external rotation to 90 degrees Baseline: Goal status: ONGOING  2.  Patient will increase left shoulder passive flexion to 120 degrees Baseline:  Goal status: GOAL MET  3.  Patient will be independent with basic exercise program Baseline:  Goal status: GOAL MET  LONG TERM GOALS: Target date: 03/05/2023    Patient will reach to a shelf overhead without increased left shoulder pain Baseline:  Goal status: ONGOING  2.  Patient will be able to reach behind her head without increased pain in order to perform grooming tasks Baseline:  Goal status: ONGOING  3.  Patient will be independent with full exercise program including light gym activity Baseline:  Goal status: INITIAL  4.  Patient will reach behind her back to T12 without increased left shoulder pain in order to perform ADLs Baseline:  Goal status: ONGOING  5.  Pt will demo L shoulder AROM to be [facility] t/o for  performance of ADLs and IADLs.   Goal status: INITIAL  6.  Pt will be able to perform her ADLs and IADLs without significant difficulty and significant pain.  Goal status: INITIAL  PLAN: PT FREQUENCY: 2x/week  PT DURATION: 9 weeks  PLANNED INTERVENTIONS: Therapeutic exercises, Therapeutic activity, Neuromuscular re-education, Patient/Family education, Self Care, Joint mobilization, Aquatic Therapy, Dry Needling, Electrical stimulation, Cryotherapy, Moist heat, Taping, Ultrasound, and Manual therapy  PLAN FOR NEXT SESSION: Progress patient band exercise, consider STM or TPDN to left upper trap.    During this treatment session, the therapist was present, participating in and directing the treatment.  Cristal Ford, SPT 02/12/23 11:05 AM

## 2023-02-17 ENCOUNTER — Encounter (HOSPITAL_BASED_OUTPATIENT_CLINIC_OR_DEPARTMENT_OTHER): Payer: Self-pay | Admitting: Physical Therapy

## 2023-02-17 ENCOUNTER — Ambulatory Visit (HOSPITAL_BASED_OUTPATIENT_CLINIC_OR_DEPARTMENT_OTHER): Payer: Commercial Managed Care - PPO | Attending: Orthopaedic Surgery | Admitting: Physical Therapy

## 2023-02-17 DIAGNOSIS — M6281 Muscle weakness (generalized): Secondary | ICD-10-CM

## 2023-02-17 DIAGNOSIS — M25612 Stiffness of left shoulder, not elsewhere classified: Secondary | ICD-10-CM | POA: Diagnosis not present

## 2023-02-17 DIAGNOSIS — M25512 Pain in left shoulder: Secondary | ICD-10-CM | POA: Diagnosis not present

## 2023-02-17 NOTE — Therapy (Signed)
OUTPATIENT PHYSICAL THERAPY TREATMENT NOTE / PROGRESS NOTE   Patient Name: Rose Bell MRN: [id number] DOB:[birth year], 42 y.o., female Today's Date: 02/17/2023  END OF SESSION:  PT End of Session - 02/17/23 0846     Visit Number 21    Number of Visits 28    Date for PT Re-Evaluation 03/05/23    Authorization Type [employer]    PT Start Time [phone]    PT Stop Time 0848    PT Time Calculation (min) 41 min    Activity Tolerance Patient tolerated treatment well    Behavior During Therapy [facility] for tasks assessed/performed                       Past Medical History:  Diagnosis Date   Hyperlipemia    Hypertension    no current problems, no meds   Pneumonia    Seasonal allergies    Tear of left rotator cuff 10/2022   Past Surgical History:  Procedure Laterality Date   ARTHOSCOPIC ROTAOR CUFF REPAIR Left 11/10/2022   Procedure: LEFT SHOULDER ARTHROSCOPY WITH ROTATOR CUFF REPAIR;  Surgeon: Huel Cote, MD;  Location: [facility] OR;  Service: Orthopedics;  Laterality: Left;   BREAST BIOPSY Right    SHOULDER SURGERY Left 11/10/2022   TUBAL LIGATION     Patient Active Problem List   Diagnosis Date Noted   Rotator cuff tear, left 10/22/2022   Elevated BP without diagnosis of hypertension 01/19/2022   Orgasmic headache 01/08/2016   Obesity (BMI 30-39.9) 01/02/2015   Hyperlipidemia 08/10/2014   General medical examination 06/26/2011   Screening for malignant neoplasm of the cervix 06/26/2011   Vitamin D deficiency 05/30/2009   Anxiety state 05/30/2009   B12 deficiency 05/23/2009    PCP: Dr Neena Rhymes MD    REFERRING PROVIDER: Dr Huel Cote   REFERRING DIAG: [phone] (ICD-10-CM) - Traumatic complete tear of left rotator cuff, initial encounter   THERAPY DIAG:  Stiffness of left shoulder, not elsewhere classified  Muscle weakness (generalized)  Acute pain of left shoulder  Rationale for Evaluation and Treatment: Rehabilitation  ONSET DATE:  11/10/2022 L RTC repair  SAD and extensive debridement   Days since surgery: 99   SUBJECTIVE:                                                                                                                                                                                      SUBJECTIVE STATEMENT: Pt is 14 weeks and 1 day s/p RCR.  Pt denies any adverse effects after prior Rx.  Pt states the dry needling helped last Rx.  Pt hasn't been having  H/A's and is easier to move L UE since dry needling.  Pt states she is not having any shoulder pain.  Pt states she is able to use her L UE more without thinking about it.  Pt reports she has improved with reaching behind back including doffing bra though is improving.  Pt has some difficulty with cleaning mirrors at home though is improving.  Pt able to hold her hand grandchild.      PERTINENT HISTORY: HTN, RTC tear  PAIN:  NPRS scale: 0/10 currently, at worst 0/10  Pain location: left shoulder  Pain description: aching, soreness  Aggravating factors: use of the shoulder  Relieving factors: Adjusting her movements   PRECAUTIONS: Shoulder  WEIGHT BEARING RESTRICTIONS: Yes    FALLS:  Has patient fallen in last 6 months? Yes. 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   Hobbies: exercise    PLOF: Independent  PATIENT GOALS:  To have use of her arm  To be able to use her left arm     OBJECTIVE:   DIAGNOSTIC FINDINGS:  Noting post-op     TODAY'S TREATMENT:                                                                                                                                           L shoulder PROM/AAROM:                       Flexion:  158  /165                     Scaption:  159     Abduction:  134  / 123                     ER:  58 / 60                     IR:  70    Functional IR:  T8  Reviewed current function, pain level, and response to prior  Rx. Pt received L shoulder flexion, abd, ER, and IR PROM in supine per pt and tissue tolerance within protocol limits.  Pt performed: Pulley flexion, scaption, and abd 2x10 each S/L ER 2x10 with 2# Supins serratus punch 2x12 with 2# Shelf reach x 10 with 0#, x 10 with 1# Jobe's flexion 2x10 with 1# Standing scaption with 1# x 10 4D ball rows on wall 2x10 each   FOTO:  Prior/Current: 55 / 62. Goal of 62 at visit #21.     PATIENT EDUCATION: Education details:  Exercise form including home exercises.  PT instructed pt in using ice especially if having soreness with home exercises.  surgical protocol, exercise form, and POC.    Person educated: Patient Education method:  Explanation,  Demonstration, Tactile cues, Verbal cues Education comprehension: verbalized understanding, returned demonstration, verbal cues required, tactile cues required, and needs further education  HOME EXERCISE PROGRAM: Access Code: [email or web address] URL: [email or web address] Date: 11/17/2022 Prepared by: Lorayne Bender    ASSESSMENT:  CLINICAL IMPRESSION:  Pt is making good progress in all areas including ROM, strength, pain, and function.  Pt reports she is not having any shoulder pain.  Pt states her UT is feeling much better since receiving dry needling.  She has not had the H/A and also has improved shoulder mobility since the dry needling last visit.  Pt has improved UE elevation and demonstrates improved AROM t/o L shoulder including functional IR AROM.  Pt continues to improve with performing ADLs and IADLs.  Pt is improving with strength as evidenced by progression and performance of resistance exercises. She is progressing appropriately with protocol.  Pt demonstrates improved self perceived disability with FOTO score improving from 55 to 62.  She met her FOTO goal.  Pt met LTG's #1,4 and partially met #5.  She is progressing toward other goals and should benefit from cont skilled PT services to  address ongoing goals, improve strength, and to assist in restoring desired level of function.      OBJECTIVE IMPAIRMENTS: decreased activity tolerance, decreased mobility, difficulty walking, decreased ROM, decreased strength, impaired UE functional use, and pain.   ACTIVITY LIMITATIONS: sleeping, bathing, dressing, self feeding, reach over head, and hygiene/grooming  PARTICIPATION LIMITATIONS: meal prep, cleaning, laundry, driving, shopping, community activity, occupation, and yard work  PERSONAL FACTORS: None   REHAB POTENTIAL: Excellent  CLINICAL DECISION MAKING: Stable/uncomplicated  EVALUATION COMPLEXITY: Low  GOALS:  SHORT TERM GOALS: Target date: 12/15/2022    Patient will increase passive L external rotation to 90 degrees Baseline: Goal status: ONGOING  2.  Patient will increase left shoulder passive flexion to 120 degrees Baseline:  Goal status: GOAL MET  3.  Patient will be independent with basic exercise program Baseline:  Goal status: GOAL MET  LONG TERM GOALS: Target date: 03/05/2023    Patient will reach to a shelf overhead without increased left shoulder pain Baseline:  Goal status: GOAL MET  2.  Patient will be able to reach behind her head without increased pain in order to perform grooming tasks Baseline:  Goal status: PROGRESSING  3.  Patient will be independent with full exercise program including light gym activity Baseline:  Goal status: INITIAL  4.  Patient will reach behind her back to T12 without increased left shoulder pain in order to perform ADLs Baseline:  Goal status: GOAL MET  5.  Pt will demo L shoulder AROM to be [facility] t/o for performance of ADLs and IADLs.   Goal status:  PARTIALLY MET  6.  Pt will be able to perform her ADLs and IADLs without significant difficulty and significant pain.  Goal status: PROGRESSING  PLAN: PT FREQUENCY: 2x/week  PT DURATION: 9 weeks  PLANNED INTERVENTIONS: Therapeutic exercises, Therapeutic  activity, Neuromuscular re-education, Patient/Family education, Self Care, Joint mobilization, Aquatic Therapy, Dry Needling, Electrical stimulation, Cryotherapy, Moist heat, Taping, Ultrasound, and Manual therapy  PLAN FOR NEXT SESSION:  Pt to continue per current POC.  Will assess strength closer to recert date.  Cont with ROM and strengthening per Dr. Serena Croissant rotator cuff repair protocol.  Consider TPDN to left upper trap if pt has further sx's.    Audie Clear III PT, DPT 02/17/23 2:31 PM

## 2023-02-19 ENCOUNTER — Ambulatory Visit (HOSPITAL_BASED_OUTPATIENT_CLINIC_OR_DEPARTMENT_OTHER): Payer: Commercial Managed Care - PPO | Admitting: Physical Therapy

## 2023-02-19 ENCOUNTER — Encounter (HOSPITAL_BASED_OUTPATIENT_CLINIC_OR_DEPARTMENT_OTHER): Payer: Self-pay | Admitting: Physical Therapy

## 2023-02-19 DIAGNOSIS — M25512 Pain in left shoulder: Secondary | ICD-10-CM | POA: Diagnosis not present

## 2023-02-19 DIAGNOSIS — M25612 Stiffness of left shoulder, not elsewhere classified: Secondary | ICD-10-CM

## 2023-02-19 DIAGNOSIS — M6281 Muscle weakness (generalized): Secondary | ICD-10-CM

## 2023-02-19 NOTE — Therapy (Signed)
OUTPATIENT PHYSICAL THERAPY TREATMENT NOTE / PROGRESS NOTE   Patient Name: Rose Bell MRN: [id number] DOB:[birth year], 42 y.o., female Today's Date: 02/19/2023  END OF SESSION:  PT End of Session - 02/19/23 0759     Visit Number 22    Number of Visits 28    Date for PT Re-Evaluation 03/05/23    Authorization Type [facility] [employer]    PT Start Time 0800    PT Stop Time [phone]    PT Time Calculation (min) 35 min    Activity Tolerance Patient tolerated treatment well    Behavior During Therapy [facility] for tasks assessed/performed                       Past Medical History:  Diagnosis Date   Hyperlipemia    Hypertension    no current problems, no meds   Pneumonia    Seasonal allergies    Tear of left rotator cuff 10/2022   Past Surgical History:  Procedure Laterality Date   ARTHOSCOPIC ROTAOR CUFF REPAIR Left 11/10/2022   Procedure: LEFT SHOULDER ARTHROSCOPY WITH ROTATOR CUFF REPAIR;  Surgeon: Huel Cote, MD;  Location: [facility] OR;  Service: Orthopedics;  Laterality: Left;   BREAST BIOPSY Right    SHOULDER SURGERY Left 11/10/2022   TUBAL LIGATION     Patient Active Problem List   Diagnosis Date Noted   Rotator cuff tear, left 10/22/2022   Elevated BP without diagnosis of hypertension 01/19/2022   Orgasmic headache 01/08/2016   Obesity (BMI 30-39.9) 01/02/2015   Hyperlipidemia 08/10/2014   General medical examination 06/26/2011   Screening for malignant neoplasm of the cervix 06/26/2011   Vitamin D deficiency 05/30/2009   Anxiety state 05/30/2009   B12 deficiency 05/23/2009    PCP: Dr Neena Rhymes MD    REFERRING PROVIDER: Dr Huel Cote   REFERRING DIAG: [phone] (ICD-10-CM) - Traumatic complete tear of left rotator cuff, initial encounter   THERAPY DIAG:  Stiffness of left shoulder, not elsewhere classified  Muscle weakness (generalized)  Acute pain of left shoulder  Rationale for Evaluation and Treatment: Rehabilitation  ONSET DATE:  11/10/2022 L RTC repair  SAD and extensive debridement   Days since surgery: 101   SUBJECTIVE:                                                                                                                                                                                      SUBJECTIVE STATEMENT: Patient is making progress. She is having no pain this morning. She felt like the needling really helped the upper trap.       PERTINENT HISTORY: HTN, RTC tear  PAIN:  NPRS scale: 0/10 currently, at worst 0/10  Pain location: left shoulder  Pain description: aching, soreness  Aggravating factors: use of the shoulder  Relieving factors: Adjusting her movements   PRECAUTIONS: Shoulder  WEIGHT BEARING RESTRICTIONS: Yes    FALLS:  Has patient fallen in last 6 months? Yes. 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   Hobbies: exercise    PLOF: Independent  PATIENT GOALS:  To have use of her arm  To be able to use her left arm     OBJECTIVE:   DIAGNOSTIC FINDINGS:  Noting post-op     TODAY'S TREATMENT:                                                                                                                                           5/3 Pulley flexion, scaption, and abd 2x10 each S/L ER 2x10 with 2# Supins serratus punch 2x12 with 2# Shelf reach x 10 with 0#, x 10 with 1# Jobe's flexion 2x10 with 1# Standing scaption with 1# x 10 4D ball rows on wall 2x10 each  PROM into all planes; inferior and posterior glides grade II and III   Last visit   L shoulder PROM/AAROM:                       Flexion:  158  /165                     Scaption:  159     Abduction:  134  / 123                     ER:  58 / 60                     IR:  70    Functional IR:  T8  Reviewed current function, pain level, and response to prior Rx. Pt received L shoulder flexion, abd, ER, and IR PROM in supine per pt and  tissue tolerance within protocol limits.  Pt performed: Pulley flexion, scaption, and abd 2x10 each S/L ER 2x10 with 2# Supins serratus punch 2x12 with 2# Shelf reach x 10 with 0#, x 10 with 1# Jobe's flexion 2x10 with 1# Standing scaption with 1# x 10 4D ball rows on wall 2x10 each   FOTO:  Prior/Current: 55 / 62. Goal of 62 at visit #21.     PATIENT EDUCATION: Education details:  Exercise form including home exercises.  PT instructed pt in using ice especially if having soreness with home exercises.  surgical protocol, exercise form, and POC.    Person educated: Patient Education method:  Explanation, Demonstration, Tactile cues, Verbal cues Education comprehension: verbalized understanding, returned demonstration, verbal cues required, tactile cues required, and needs  further education  HOME EXERCISE PROGRAM: Access Code: [email or web address] URL: [email or web address] Date: 11/17/2022 Prepared by: Lorayne Bender    ASSESSMENT:  CLINICAL IMPRESSION:  The patient continues to progress well. She had mild limitations in end range ER which improved with manual therapy. She tolerated there-ex well. Therapy will continue to progress as tolerated We will continue to update her HEP in preporation for D/C. No needling required today.  OBJECTIVE IMPAIRMENTS: decreased activity tolerance, decreased mobility, difficulty walking, decreased ROM, decreased strength, impaired UE functional use, and pain.   ACTIVITY LIMITATIONS: sleeping, bathing, dressing, self feeding, reach over head, and hygiene/grooming  PARTICIPATION LIMITATIONS: meal prep, cleaning, laundry, driving, shopping, community activity, occupation, and yard work  PERSONAL FACTORS: None   REHAB POTENTIAL: Excellent  CLINICAL DECISION MAKING: Stable/uncomplicated  EVALUATION COMPLEXITY: Low  GOALS:  SHORT TERM GOALS: Target date: 12/15/2022    Patient will increase passive L external rotation to 90  degrees Baseline: Goal status: ONGOING  2.  Patient will increase left shoulder passive flexion to 120 degrees Baseline:  Goal status: GOAL MET  3.  Patient will be independent with basic exercise program Baseline:  Goal status: GOAL MET  LONG TERM GOALS: Target date: 03/05/2023    Patient will reach to a shelf overhead without increased left shoulder pain Baseline:  Goal status: GOAL MET  2.  Patient will be able to reach behind her head without increased pain in order to perform grooming tasks Baseline:  Goal status: PROGRESSING  3.  Patient will be independent with full exercise program including light gym activity Baseline:  Goal status: INITIAL  4.  Patient will reach behind her back to T12 without increased left shoulder pain in order to perform ADLs Baseline:  Goal status: GOAL MET  5.  Pt will demo L shoulder AROM to be [facility] t/o for performance of ADLs and IADLs.   Goal status:  PARTIALLY MET  6.  Pt will be able to perform her ADLs and IADLs without significant difficulty and significant pain.  Goal status: PROGRESSING  PLAN: PT FREQUENCY: 2x/week  PT DURATION: 9 weeks  PLANNED INTERVENTIONS: Therapeutic exercises, Therapeutic activity, Neuromuscular re-education, Patient/Family education, Self Care, Joint mobilization, Aquatic Therapy, Dry Needling, Electrical stimulation, Cryotherapy, Moist heat, Taping, Ultrasound, and Manual therapy  PLAN FOR NEXT SESSION:  Pt to continue per current POC.  Will assess strength closer to recert date.  Cont with ROM and strengthening per Dr. Serena Croissant rotator cuff repair protocol.  Consider TPDN to left upper trap if pt has further sx's.    Lorayne Bender PT DPT 02/19/23 2:41 PM

## 2023-02-24 ENCOUNTER — Encounter (HOSPITAL_BASED_OUTPATIENT_CLINIC_OR_DEPARTMENT_OTHER): Payer: Self-pay | Admitting: Physical Therapy

## 2023-02-24 ENCOUNTER — Ambulatory Visit (HOSPITAL_BASED_OUTPATIENT_CLINIC_OR_DEPARTMENT_OTHER): Payer: Commercial Managed Care - PPO | Admitting: Physical Therapy

## 2023-02-24 DIAGNOSIS — M25512 Pain in left shoulder: Secondary | ICD-10-CM

## 2023-02-24 DIAGNOSIS — M25612 Stiffness of left shoulder, not elsewhere classified: Secondary | ICD-10-CM | POA: Diagnosis not present

## 2023-02-24 DIAGNOSIS — M6281 Muscle weakness (generalized): Secondary | ICD-10-CM

## 2023-02-24 NOTE — Therapy (Signed)
OUTPATIENT PHYSICAL THERAPY TREATMENT NOTE    Patient Name: Rose Bell MRN: [id number] DOB:Apr 21, 1981, 42 y.o., female Today's Date: 02/25/2023  END OF SESSION:   PT End of Session - 02/19/23 0759       Visit Number 23     Number of Visits 28     Date for PT Re-Evaluation 03/05/23     Authorization Type [facility] [employer]     PT Start Time 0802     PT Stop Time [phone]     PT Time Calculation (min) 41 min     Activity Tolerance Patient tolerated treatment well     Behavior During Therapy [facility] for tasks assessed/performed                 Past Medical History:  Diagnosis Date   Hyperlipemia    Hypertension    no current problems, no meds   Pneumonia    Seasonal allergies    Tear of left rotator cuff 10/2022   Past Surgical History:  Procedure Laterality Date   ARTHOSCOPIC ROTAOR CUFF REPAIR Left 11/10/2022   Procedure: LEFT SHOULDER ARTHROSCOPY WITH ROTATOR CUFF REPAIR;  Surgeon: Huel Cote, MD;  Location: [facility] OR;  Service: Orthopedics;  Laterality: Left;   BREAST BIOPSY Right    SHOULDER SURGERY Left 11/10/2022   TUBAL LIGATION     Patient Active Problem List   Diagnosis Date Noted   Rotator cuff tear, left 10/22/2022   Elevated BP without diagnosis of hypertension 01/19/2022   Orgasmic headache 01/08/2016   Obesity (BMI 30-39.9) 01/02/2015   Hyperlipidemia 08/10/2014   General medical examination 06/26/2011   Screening for malignant neoplasm of the cervix 06/26/2011   Vitamin D deficiency 05/30/2009   Anxiety state 05/30/2009   B12 deficiency 05/23/2009    PCP: Dr Neena Rhymes MD    REFERRING PROVIDER: Dr Huel Cote   REFERRING DIAG: [phone] (ICD-10-CM) - Traumatic complete tear of left rotator cuff, initial encounter   THERAPY DIAG:  Stiffness of left shoulder, not elsewhere classified  Muscle weakness (generalized)  Acute pain of left shoulder  Rationale for Evaluation and Treatment: Rehabilitation  ONSET DATE: 11/10/2022 L  RTC repair  SAD and extensive debridement   Days since surgery: 106   SUBJECTIVE:                                                                                                                                                                                      SUBJECTIVE STATEMENT: Patient is 15 weeks and 1 day s/p RCR.  Pt denies any adverse effects after prior Rx.  Pt denies pain currently.  Pt denies any problem reaching  into a cabinet though doesn't feel strong.        PERTINENT HISTORY: HTN, RTC tear  PAIN:  NPRS scale: 0/10 currently, at worst 0/10  Pain location: left shoulder  Pain description: aching, soreness  Aggravating factors: use of the shoulder  Relieving factors: Adjusting her movements   PRECAUTIONS: Shoulder  WEIGHT BEARING RESTRICTIONS: Yes    FALLS:  Has patient fallen in last 6 months? Yes. 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   Hobbies: exercise    PLOF: Independent  PATIENT GOALS:  To have use of her arm  To be able to use her left arm     OBJECTIVE:   DIAGNOSTIC FINDINGS:  Noting post-op     TODAY'S TREATMENT:                                                                                                                                          Manual Therapy:  Gentle distraction with oscillation and grade II- III  Posterior and inf jt mobs to L GH joint to improve pain, stiffness, and to normalize arthrokinematics Pt received L shoulder flexion, scaption, abd, ER, and IR PROM in supine per pt and tissue tolerance within protocol limits.  Pulley flexion, scaption, and abd 2x10 each S/L ER 2x10 with 2# Supins serratus punch 2x12 with 2#, 1x10 with 3# Prone horizontal abd 2x8 Shelf reach 2 x 10 with 1# Jobe's flexion 1x10 with 1#, 2x10 with 2# Standing scaption with 1# 2 x 10 4D ball rows on wall 2x10 each ER/IR with arm at side with GTB  2x10     PATIENT EDUCATION: Education details:  Exercise form, HEP, and POC.    Person educated: Patient Education method:  Explanation, Demonstration, Tactile cues, Verbal cues Education comprehension: verbalized understanding, returned demonstration, verbal cues required, tactile cues required, and needs further education  HOME EXERCISE PROGRAM: Access Code: [email or web address] URL: [email or web address] Date: 11/17/2022 Prepared by: Lorayne Bender    ASSESSMENT:  CLINICAL IMPRESSION: Pt continues to progress well in all areas.  She is improving with shoulder strength and stability as evidenced by performance and progression of exercises.  She performed exercises per protocol well.  Pt has made good progress with shoulder ROM.  She responded well to Rx having no pain and no c/o's after Rx.    OBJECTIVE IMPAIRMENTS: decreased activity tolerance, decreased mobility, difficulty walking, decreased ROM, decreased strength, impaired UE functional use, and pain.   ACTIVITY LIMITATIONS: sleeping, bathing, dressing, self feeding, reach over head, and hygiene/grooming  PARTICIPATION LIMITATIONS: meal prep, cleaning, laundry, driving, shopping, community activity, occupation, and yard work  PERSONAL FACTORS: None   REHAB POTENTIAL: Excellent  CLINICAL DECISION MAKING: Stable/uncomplicated  EVALUATION COMPLEXITY: Low  GOALS:  SHORT TERM GOALS: Target date: 12/15/2022    Patient will increase passive L external rotation  to 90 degrees Baseline: Goal status: ONGOING  2.  Patient will increase left shoulder passive flexion to 120 degrees Baseline:  Goal status: GOAL MET  3.  Patient will be independent with basic exercise program Baseline:  Goal status: GOAL MET  LONG TERM GOALS: Target date: 03/05/2023    Patient will reach to a shelf overhead without increased left shoulder pain Baseline:  Goal status: GOAL MET  2.  Patient will be able to reach behind her head  without increased pain in order to perform grooming tasks Baseline:  Goal status: PROGRESSING  3.  Patient will be independent with full exercise program including light gym activity Baseline:  Goal status: INITIAL  4.  Patient will reach behind her back to T12 without increased left shoulder pain in order to perform ADLs Baseline:  Goal status: GOAL MET  5.  Pt will demo L shoulder AROM to be [facility] t/o for performance of ADLs and IADLs.   Goal status:  PARTIALLY MET  6.  Pt will be able to perform her ADLs and IADLs without significant difficulty and significant pain.  Goal status: PROGRESSING  PLAN: PT FREQUENCY: 2x/week  PT DURATION: 9 weeks  PLANNED INTERVENTIONS: Therapeutic exercises, Therapeutic activity, Neuromuscular re-education, Patient/Family education, Self Care, Joint mobilization, Aquatic Therapy, Dry Needling, Electrical stimulation, Cryotherapy, Moist heat, Taping, Ultrasound, and Manual therapy  PLAN FOR NEXT SESSION:  Pt to continue per current POC.  Will assess strength closer to recert date.  Cont with ROM and strengthening per Dr. Serena Croissant rotator cuff repair protocol.  Consider TPDN to left upper trap if pt has further sx's.    Audie Clear III PT, DPT 02/25/23 5:23 PM

## 2023-02-25 ENCOUNTER — Other Ambulatory Visit: Payer: Self-pay | Admitting: Family Medicine

## 2023-02-25 ENCOUNTER — Other Ambulatory Visit: Payer: Self-pay

## 2023-02-25 ENCOUNTER — Other Ambulatory Visit (HOSPITAL_COMMUNITY): Payer: Self-pay

## 2023-02-25 DIAGNOSIS — E782 Mixed hyperlipidemia: Secondary | ICD-10-CM

## 2023-02-25 MED ORDER — ROSUVASTATIN CALCIUM 10 MG PO TABS
10.0000 mg | ORAL_TABLET | Freq: Every day | ORAL | 0 refills | Status: DC
  Filled 2023-02-25: qty 90, 90d supply, fill #0

## 2023-03-03 ENCOUNTER — Ambulatory Visit (HOSPITAL_BASED_OUTPATIENT_CLINIC_OR_DEPARTMENT_OTHER): Payer: Commercial Managed Care - PPO | Admitting: Physical Therapy

## 2023-03-03 DIAGNOSIS — M25512 Pain in left shoulder: Secondary | ICD-10-CM

## 2023-03-03 DIAGNOSIS — M25612 Stiffness of left shoulder, not elsewhere classified: Secondary | ICD-10-CM

## 2023-03-03 DIAGNOSIS — M6281 Muscle weakness (generalized): Secondary | ICD-10-CM | POA: Diagnosis not present

## 2023-03-03 NOTE — Therapy (Incomplete)
OUTPATIENT PHYSICAL THERAPY TREATMENT NOTE    Patient Name: Rose Bell MRN: [id number] DOB:09/19/1981, 42 y.o., female Today's Date: 03/04/2023  END OF SESSION:  PT End of Session - 03/03/23 0824     Visit Number 24    Number of Visits 29    Date for PT Re-Evaluation 03/31/23    Authorization Type [employer]    PT Start Time [phone]    PT Stop Time [phone]    PT Time Calculation (min) 48 min    Activity Tolerance Patient tolerated treatment well    Behavior During Therapy [facility] for tasks assessed/performed                          Past Medical History:  Diagnosis Date   Hyperlipemia    Hypertension    no current problems, no meds   Pneumonia    Seasonal allergies    Tear of left rotator cuff 10/2022   Past Surgical History:  Procedure Laterality Date   ARTHOSCOPIC ROTAOR CUFF REPAIR Left 11/10/2022   Procedure: LEFT SHOULDER ARTHROSCOPY WITH ROTATOR CUFF REPAIR;  Surgeon: Huel Cote, MD;  Location: [facility] OR;  Service: Orthopedics;  Laterality: Left;   BREAST BIOPSY Right    SHOULDER SURGERY Left 11/10/2022   TUBAL LIGATION     Patient Active Problem List   Diagnosis Date Noted   Rotator cuff tear, left 10/22/2022   Elevated BP without diagnosis of hypertension 01/19/2022   Orgasmic headache 01/08/2016   Obesity (BMI 30-39.9) 01/02/2015   Hyperlipidemia 08/10/2014   General medical examination 06/26/2011   Screening for malignant neoplasm of the cervix 06/26/2011   Vitamin D deficiency 05/30/2009   Anxiety state 05/30/2009   B12 deficiency 05/23/2009    PCP: Dr Neena Rhymes MD    REFERRING PROVIDER: Dr Huel Cote   REFERRING DIAG: [phone] (ICD-10-CM) - Traumatic complete tear of left rotator cuff, initial encounter   THERAPY DIAG:  Stiffness of left shoulder, not elsewhere classified  Muscle weakness (generalized)  Acute pain of left shoulder  Rationale for Evaluation and Treatment: Rehabilitation  ONSET DATE:  11/10/2022 L RTC repair  SAD and extensive debridement   Days since surgery: 113   SUBJECTIVE:                                                                                                                                                                                      SUBJECTIVE STATEMENT: Patient is 16 weeks and 1 day s/p RCR.  Pt denies any adverse effects after prior Rx.  Pt states her shoulder is doing well though her neck is bothering  her. Pt denies pain in shoulder currently though has some L sided cervical pain.  Pt is able to reach into a cabinet well.  Pt reports no limitation with self care activities, just not normal yet.    PERTINENT HISTORY: HTN, RTC tear  PAIN:  NPRS scale:           0/10      /  2/10 Pain location: left shoulder / L sided cervical pain Pain description: restricted squeeze, pinch, tight  Aggravating factors: use of the shoulder  Relieving factors: Adjusting her movements   PRECAUTIONS: Shoulder  WEIGHT BEARING RESTRICTIONS: Yes    FALLS:  Has patient fallen in last 6 months? Yes. 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   Hobbies: exercise    PLOF: Independent  PATIENT GOALS:  To have use of her arm  To be able to use her left arm     OBJECTIVE:   DIAGNOSTIC FINDINGS:  Noting post-op     TODAY'S TREATMENT:                                                                                                                                          Manual Therapy:  STM to L sided UT and cervical paraspinals to improve pain and soft tissue tightness and mobility.  Shoulder strength: R:  5/5 in flex and abd L:  4/5 in flex and abd  ;  4+/5 in ER and IR   Pulley flexion, scaption, and abd x20 each UBE L1 x 3 mins S/L ER 2x10 with 2# Prone horizontal abd 2x10 Shelf reach 2 x 10 with 1# Jobe's flexion 3x10 with 2# Standing scaption with 1# 2 x 10 4D ball rows  on wall 2x10 each with 1.1# ball     PATIENT EDUCATION: Education details:  Exercise form, HEP, and POC.    Person educated: Patient Education method:  Explanation, Demonstration, Tactile cues, Verbal cues Education comprehension: verbalized understanding, returned demonstration, verbal cues required, tactile cues required, and needs further education  HOME EXERCISE PROGRAM: Access Code: [email or web address] URL: [email or web address] Date: 11/17/2022 Prepared by: Lorayne Bender    ASSESSMENT:  CLINICAL IMPRESSION: Pt continues to progress well in all areas.  Pt has made good progress with shoulder ROM and UE elevation.  She is improving with shoulder strength and stability as evidenced by performance and progression of exercises.  PT assessed strength today and pt has expected weakness in L shoulder.  Pt is improving with performance of functional usage of L UE including with ADLs/IADLs and reports no limitation with self care activities.  Pt reports having tightness and L sided cervical pain today.  PT performed STW to L UT and cervical paraspinals and pt reports feeling much better having improved sx's.  Pt continues to progress well toward goals.  PT added  a new long term goal today for advanced HEP.   She responded well to Rx having no pain and no c/o's after Rx.  Pt should benefit from cont skilled PT services per protocol to improve strength, address ongoing goals, and to establish independence with advanced HEP.     OBJECTIVE IMPAIRMENTS: decreased activity tolerance, decreased mobility, difficulty walking, decreased ROM, decreased strength, impaired UE functional use, and pain.   ACTIVITY LIMITATIONS: sleeping, bathing, dressing, self feeding, reach over head, and hygiene/grooming  PARTICIPATION LIMITATIONS: meal prep, cleaning, laundry, driving, shopping, community activity, occupation, and yard work  PERSONAL FACTORS: None   REHAB POTENTIAL: Excellent  CLINICAL  DECISION MAKING: Stable/uncomplicated  EVALUATION COMPLEXITY: Low  GOALS:  SHORT TERM GOALS: Target date: 12/15/2022    Patient will increase passive L external rotation to 90 degrees Baseline: Goal status: ONGOING  2.  Patient will increase left shoulder passive flexion to 120 degrees Baseline:  Goal status: GOAL MET  3.  Patient will be independent with basic exercise program Baseline:  Goal status: GOAL MET  LONG TERM GOALS: Target date: 03/31/2023    Patient will reach to a shelf overhead without increased left shoulder pain Baseline:  Goal status: GOAL MET  2.  Patient will be able to reach behind her head without increased pain in order to perform grooming tasks Baseline:  Goal status: PROGRESSING  3.  Patient will be independent with full exercise program including light gym activity Baseline:  Goal status: INITIAL  4.  Patient will reach behind her back to T12 without increased left shoulder pain in order to perform ADLs Baseline:  Goal status: GOAL MET  5.  Pt will demo L shoulder AROM to be [facility] t/o for performance of ADLs and IADLs.   Goal status:  PARTIALLY MET  6.  Pt will be able to perform her ADLs and IADLs without significant difficulty and significant pain.  Goal status: PROGRESSING  7.  Pt will be independent with advanced HEP to establish long term program for improved strength, stability, and ROM and to maximize function.     Goal status:  INITIAL  PLAN: PT FREQUENCY: 1-2x/wk  PT DURATION: 3-4 weeks  PLANNED INTERVENTIONS: Therapeutic exercises, Therapeutic activity, Neuromuscular re-education, Patient/Family education, Self Care, Joint mobilization, Aquatic Therapy, Dry Needling, Electrical stimulation, Cryotherapy, Moist heat, Taping, Ultrasound, and Manual therapy  PLAN FOR NEXT SESSION:  Cont with ROM and strengthening per Dr. Serena Croissant rotator cuff repair protocol.  Consider TPDN and STM to left upper trap if pt has further sx's.     Audie Clear III PT, DPT 03/04/23 9:03 AM

## 2023-03-04 ENCOUNTER — Encounter (HOSPITAL_BASED_OUTPATIENT_CLINIC_OR_DEPARTMENT_OTHER): Payer: Self-pay | Admitting: Physical Therapy

## 2023-03-09 ENCOUNTER — Ambulatory Visit (HOSPITAL_BASED_OUTPATIENT_CLINIC_OR_DEPARTMENT_OTHER): Payer: Commercial Managed Care - PPO | Admitting: Physical Therapy

## 2023-03-09 DIAGNOSIS — M6281 Muscle weakness (generalized): Secondary | ICD-10-CM | POA: Diagnosis not present

## 2023-03-09 DIAGNOSIS — M25512 Pain in left shoulder: Secondary | ICD-10-CM | POA: Diagnosis not present

## 2023-03-09 DIAGNOSIS — M25612 Stiffness of left shoulder, not elsewhere classified: Secondary | ICD-10-CM | POA: Diagnosis not present

## 2023-03-09 NOTE — Therapy (Signed)
OUTPATIENT PHYSICAL THERAPY TREATMENT NOTE    Patient Name: Rose Bell MRN: [id number] DOB:10-20-1980, 42 y.o., female Today's Date: 03/09/2023  END OF SESSION:  PT End of Session - 03/03/23 0824     Visit Number 25   Number of Visits 29    Date for PT Re-Evaluation 03/31/23    Authorization Type [employer]    PT Start Time [phone]    PT Stop Time [phone]    PT Time Calculation (min) 48 min    Activity Tolerance Patient tolerated treatment well    Behavior During Therapy [facility] for tasks assessed/performed                          Past Medical History:  Diagnosis Date   Hyperlipemia    Hypertension    no current problems, no meds   Pneumonia    Seasonal allergies    Tear of left rotator cuff 10/2022   Past Surgical History:  Procedure Laterality Date   ARTHOSCOPIC ROTAOR CUFF REPAIR Left 11/10/2022   Procedure: LEFT SHOULDER ARTHROSCOPY WITH ROTATOR CUFF REPAIR;  Surgeon: Huel Cote, MD;  Location: [facility] OR;  Service: Orthopedics;  Laterality: Left;   BREAST BIOPSY Right    SHOULDER SURGERY Left 11/10/2022   TUBAL LIGATION     Patient Active Problem List   Diagnosis Date Noted   Rotator cuff tear, left 10/22/2022   Elevated BP without diagnosis of hypertension 01/19/2022   Orgasmic headache 01/08/2016   Obesity (BMI 30-39.9) 01/02/2015   Hyperlipidemia 08/10/2014   General medical examination 06/26/2011   Screening for malignant neoplasm of the cervix 06/26/2011   Vitamin D deficiency 05/30/2009   Anxiety state 05/30/2009   B12 deficiency 05/23/2009    PCP: Dr Neena Rhymes MD    REFERRING PROVIDER: Dr Huel Cote   REFERRING DIAG: [phone] (ICD-10-CM) - Traumatic complete tear of left rotator cuff, initial encounter   THERAPY DIAG:  Stiffness of left shoulder, not elsewhere classified  Muscle weakness (generalized)  Acute pain of left shoulder  Rationale for Evaluation and Treatment: Rehabilitation  ONSET DATE: 11/10/2022  L RTC repair  SAD and extensive debridement   Days since surgery: 119   SUBJECTIVE:                                                                                                                                                                                      SUBJECTIVE STATEMENT: Patient is 16 weeks and 1 day s/p RCR.  Pt denies any adverse effects after prior Rx.  Pt states her shoulder is doing well though her neck is bothering her.  Pt denies pain in shoulder currently though has some L sided cervical pain.  Pt is able to reach into a cabinet well.  Pt reports no limitation with self care activities, just not normal yet.    PERTINENT HISTORY: HTN, RTC tear  PAIN:  NPRS scale:           0/10      /  2/10 Pain location: left shoulder / L sided cervical pain Pain description: restricted squeeze, pinch, tight  Aggravating factors: use of the shoulder  Relieving factors: Adjusting her movements   PRECAUTIONS: Shoulder  WEIGHT BEARING RESTRICTIONS: Yes    FALLS:  Has patient fallen in last 6 months? Yes. 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   Hobbies: exercise    PLOF: Independent  PATIENT GOALS:  To have use of her arm  To be able to use her left arm     OBJECTIVE:   DIAGNOSTIC FINDINGS:  Noting post-op     TODAY'S TREATMENT:                                                                                                                                         5/21   Discussed how to use RPE to target strengthening better and more safely  Reviewed exercises to work on and avoid  Reviewed strengthening of abduction.   Reviewed FOTO score   Reviewed goals      Last visit: Manual Therapy:  STM to L sided UT and cervical paraspinals to improve pain and soft tissue tightness and mobility.  Shoulder strength: R:  5/5 in flex and abd L:  4/5 in flex and abd  ;  4+/5 in ER and  IR   Pulley flexion, scaption, and abd x20 each UBE L1 x 3 mins S/L ER 2x10 with 2# Prone horizontal abd 2x10 Shelf reach 2 x 10 with 1# Jobe's flexion 3x10 with 2# Standing scaption with 1# 2 x 10 4D ball rows on wall 2x10 each with 1.1# ball     PATIENT EDUCATION: Education details:  Exercise form, HEP, and POC.    Person educated: Patient Education method:  Explanation, Demonstration, Tactile cues, Verbal cues Education comprehension: verbalized understanding, returned demonstration, verbal cues required, tactile cues required, and needs further education  HOME EXERCISE PROGRAM: Access Code: [email or web address] URL: [email or web address] Date: 11/17/2022 Prepared by: Lorayne Bender    ASSESSMENT:  CLINICAL IMPRESSION: The patient has made great progress. She is back to her workout classes. She has minor pain at times. Therapy measured her strength. She is equal with everything except forward flexion.  She also has some weakness with abduction. We reviewed how to strengthen that movement. We reviewed use of RPE to target her sets, reps and weights,. Her FOTO score is over her goal. She was advised to continue her exercises  for at least 3 more months and to keep doing her workout class if she feels good.  See below for goal specific progress.    OBJECTIVE IMPAIRMENTS: decreased activity tolerance, decreased mobility, difficulty walking, decreased ROM, decreased strength, impaired UE functional use, and pain.   ACTIVITY LIMITATIONS: sleeping, bathing, dressing, self feeding, reach over head, and hygiene/grooming  PARTICIPATION LIMITATIONS: meal prep, cleaning, laundry, driving, shopping, community activity, occupation, and yard work  PERSONAL FACTORS: None   REHAB POTENTIAL: Excellent  CLINICAL DECISION MAKING: Stable/uncomplicated  EVALUATION COMPLEXITY: Low  GOALS:  SHORT TERM GOALS: Target date: 12/15/2022    Patient will increase passive L external rotation  to 90 degrees Baseline: Goal status: ONGOING  2.  Patient will increase left shoulder passive flexion to 120 degrees Baseline:  Goal status: GOAL MET  3.  Patient will be independent with basic exercise program Baseline:  Goal status: GOAL MET  LONG TERM GOALS: Target date: 03/31/2023    Patient will reach to a shelf overhead without increased left shoulder pain Baseline:  Goal status: GOAL MET  2.  Patient will be able to reach behind her head without increased pain in order to perform grooming tasks Baseline:  Goal status: PROGRESSING  3.  Patient will be independent with full exercise program including light gym activity Baseline:  Goal status: INITIAL  4.  Patient will reach behind her back to T12 without increased left shoulder pain in order to perform ADLs Baseline:  Goal status: GOAL MET  5.  Pt will demo L shoulder AROM to be [facility] t/o for performance of ADLs and IADLs.   Goal status:  PARTIALLY MET  6.  Pt will be able to perform her ADLs and IADLs without significant difficulty and significant pain.  Goal status: PROGRESSING  7.  Pt will be independent with advanced HEP to establish long term program for improved strength, stability, and ROM and to maximize function.     Goal status:  INITIAL  PLAN: PT FREQUENCY: 1-2x/wk  PT DURATION: 3-4 weeks  PLANNED INTERVENTIONS: Therapeutic exercises, Therapeutic activity, Neuromuscular re-education, Patient/Family education, Self Care, Joint mobilization, Aquatic Therapy, Dry Needling, Electrical stimulation, Cryotherapy, Moist heat, Taping, Ultrasound, and Manual therapy  PLAN FOR NEXT SESSION:  Cont with ROM and strengthening per Dr. Serena Croissant rotator cuff repair protocol.  Consider TPDN and STM to left upper trap if pt has further sx's.    Audie Clear III PT, DPT 03/09/23 10:38 AM

## 2023-05-06 ENCOUNTER — Encounter: Payer: Self-pay | Admitting: Family Medicine

## 2023-05-06 ENCOUNTER — Ambulatory Visit (INDEPENDENT_AMBULATORY_CARE_PROVIDER_SITE_OTHER): Payer: Commercial Managed Care - PPO | Admitting: Family Medicine

## 2023-05-06 ENCOUNTER — Ambulatory Visit
Admission: RE | Admit: 2023-05-06 | Discharge: 2023-05-06 | Disposition: A | Discharge status: Home / Self Care | Payer: Commercial Managed Care - PPO | Source: Ambulatory Visit | Attending: Family Medicine | Admitting: Family Medicine

## 2023-05-06 ENCOUNTER — Other Ambulatory Visit (HOSPITAL_BASED_OUTPATIENT_CLINIC_OR_DEPARTMENT_OTHER): Payer: Self-pay

## 2023-05-06 VITALS — BP 118/80 | HR 77 | Temp 98.2°F | Resp 18 | Ht 63.0 in | Wt 202.4 lb

## 2023-05-06 DIAGNOSIS — E782 Mixed hyperlipidemia: Secondary | ICD-10-CM

## 2023-05-06 DIAGNOSIS — R21 Rash and other nonspecific skin eruption: Secondary | ICD-10-CM | POA: Diagnosis not present

## 2023-05-06 DIAGNOSIS — L509 Urticaria, unspecified: Secondary | ICD-10-CM | POA: Diagnosis not present

## 2023-05-06 DIAGNOSIS — K130 Diseases of lips: Secondary | ICD-10-CM

## 2023-05-06 DIAGNOSIS — Z1231 Encounter for screening mammogram for malignant neoplasm of breast: Secondary | ICD-10-CM

## 2023-05-06 MED ORDER — HYDROCORTISONE 2.5 % EX OINT
TOPICAL_OINTMENT | Freq: Two times a day (BID) | CUTANEOUS | 0 refills | Status: DC
  Filled 2023-05-06: qty 28.35, 14d supply, fill #0

## 2023-05-06 NOTE — Progress Notes (Unsigned)
   Subjective:    Patient ID: Rose Bell, female    DOB: [birth year], 42 y.o.   MRN: [id number]  HPI Skin rashes- pt reports skin will break out and get very itchy after eating.  Pt feels that skin will react to sugar, dairy, and sun.  This weekend had hives.  First noticed in May.  Pt describes it as 'itchy acne'.  Taking daily antihistamine.    Skin lesion- pt reports area on L upper lip and on back that will get red, flaky and then heal.  Lesions will return.  Not painful.  Area will get 'real dry'.  Pt reports this is different than a cold sore or fever blister.   Review of Systems For ROS see HPI     Objective:   Physical Exam Vitals reviewed.  Constitutional:      General: She is not in acute distress.    Appearance: Normal appearance. She is not ill-appearing.  HENT:     Head: Normocephalic and atraumatic.     Mouth/Throat:     Mouth: Mucous membranes are moist.  Skin:    General: Skin is warm and dry.     Comments: Erythematous, flaking area at vermilion border of L upper lip  Neurological:     General: No focal deficit present.     Mental Status: She is alert and oriented to person, place, and time.  Psychiatric:        Mood and Affect: Mood normal.        Behavior: Behavior normal.        Thought Content: Thought content normal.           Assessment & Plan:  Rash of face- new.  Pt reports this tends to occur after eating.  She has not found a specific trigger but does find that sxs seem to worsen w/ sugar, dairy, and the sun.  Given that areas are red and itchy- and at times urticarial- will refer to Allergy to see if we can find specific triggers.  Pt expressed understanding and is in agreement w/ plan.   Lip lesion- new.  Does not appear consistent w/ cold sore or HSV.  Does not look cancerous.  Appear to be eczematous.  Will start topical hydrocortisone ointment and monitor for improvement.  If no improvement, will refer to Derm for complete evaluation.   Pt expressed understanding and is in agreement w/ plan.

## 2023-05-06 NOTE — Patient Instructions (Signed)
Follow up as needed or as scheduled Keep skin well moisturized Drink LOTS of water to stay hydrated inside and out Use the topical ointment on the upper lip twice daily in a small amount If no improvement after 7-10 days, let me know We'll call you to schedule your allergy appt Call with any questions or concerns Stay Safe!  Stay Healthy! Hang in there!

## 2023-05-07 ENCOUNTER — Ambulatory Visit (HOSPITAL_BASED_OUTPATIENT_CLINIC_OR_DEPARTMENT_OTHER): Payer: Commercial Managed Care - PPO | Admitting: Orthopaedic Surgery

## 2023-05-19 ENCOUNTER — Other Ambulatory Visit: Payer: Self-pay

## 2023-05-19 ENCOUNTER — Other Ambulatory Visit: Payer: Self-pay | Admitting: Family Medicine

## 2023-05-19 ENCOUNTER — Other Ambulatory Visit (HOSPITAL_COMMUNITY): Payer: Self-pay

## 2023-05-19 DIAGNOSIS — E782 Mixed hyperlipidemia: Secondary | ICD-10-CM

## 2023-05-19 MED ORDER — ROSUVASTATIN CALCIUM 10 MG PO TABS
10.0000 mg | ORAL_TABLET | Freq: Every day | ORAL | 0 refills | Status: DC
Start: 2023-05-19 — End: 2023-09-15
  Filled 2023-05-19: qty 90, 90d supply, fill #0

## 2023-06-25 ENCOUNTER — Other Ambulatory Visit (HOSPITAL_COMMUNITY): Payer: Self-pay

## 2023-06-25 ENCOUNTER — Ambulatory Visit (INDEPENDENT_AMBULATORY_CARE_PROVIDER_SITE_OTHER): Payer: Commercial Managed Care - PPO | Admitting: Internal Medicine

## 2023-06-25 ENCOUNTER — Encounter: Payer: Self-pay | Admitting: Internal Medicine

## 2023-06-25 VITALS — BP 156/80 | HR 70 | Temp 97.6°F | Resp 16 | Ht 63.0 in | Wt 202.0 lb

## 2023-06-25 DIAGNOSIS — J310 Chronic rhinitis: Secondary | ICD-10-CM

## 2023-06-25 DIAGNOSIS — R202 Paresthesia of skin: Secondary | ICD-10-CM

## 2023-06-25 DIAGNOSIS — L719 Rosacea, unspecified: Secondary | ICD-10-CM | POA: Diagnosis not present

## 2023-06-25 MED ORDER — METRONIDAZOLE 0.75 % EX GEL
1.0000 | Freq: Two times a day (BID) | CUTANEOUS | 3 refills | Status: DC
  Filled 2023-06-25: qty 45, 23d supply, fill #0

## 2023-06-25 MED ORDER — METRONIDAZOLE 0.75 % EX GEL: 1.0000 | Freq: Two times a day (BID) | CUTANEOUS | 3 refills | Status: DC

## 2023-06-25 NOTE — Patient Instructions (Addendum)
Rosacea Treatment:  - preventative methods (see below) - topical metronidazole gel-apply twice daily  Rosacea Care: - avoid flushing which can worsen skin symptoms (extreme temperatures, sunlight, spicy foods, alcohol, exercise, stress) - cleanse skin gently with hypoallegenic cleanser with lukewarm water, wash with your fingers, and avoid harsh scrubbing - avoid irritating topical products such as toners, astringents, and chemical exfoliating agents (eg, alpha hydroxy acids); do not use rough cloths/brushes for exfoliation - daily application of a broad-spectrum sunscreen with a sun protection factor (SPF) of at least 30  -  green-tinted foundation can help to camouflage rash if you are bothered by the skin discoloration  If no better, consider patch testing.    Suspected allergic rhinitis - allergy testing today: deferred due to patient preference - Symptom control: - Consider Nasal Steroid Spray: Best results if used daily. - Options include Flonase (fluticasone), Nasocort (triamcinolone), Nasonex (mometasome) 1- 2 sprays in each nostril daily.  - All can be purchased over-the-counter if not covered by insurance. - Continue Antihistamine: daily or daily as needed.   -Options include Zyrtec (Cetirizine) 10mg , Claritin (Loratadine) 10mg , Allegra (Fexofenadine) 180mg , or Xyzal (Levocetirinze) 5mg  - Can be purchased over-the-counter if not covered by insurance.  Notalgia paresthetica-itchy spot on back - consider sarna lotion (anti-itch relief) as needed  Follow up : 6-8 weeks, sooner if needed It was a pleasure meeting you in clinic today! Thank you for allowing me to participate in your care.  Tonny Bollman, MD [facility] 

## 2023-06-25 NOTE — Progress Notes (Signed)
NEW PATIENT Date of Service/Encounter:  06/25/23 Referring provider: Sheliah Hatch, MD Primary care provider: Sheliah Hatch, MD  Subjective:  Rose Bell is a 42 y.o. female presenting today for evaluation of rash History obtained from: chart review and patient.   Facial rash: Since May has a red and itchy rash on her face. Tends to worsen with sugary foods/high in carbs or with sun exposure. She is only using a dove soap bar and aveeno oat moisturizer on her skin, occasional mascara and lip gloss.  No other products. No changes to products. Rash never completely resolves, but does flare up intermittently without any specific pattern. Trigger unknown.  She has also had a dry spot above her lip that was coming and going.  IT would applea, get dry and flake, resolve and return. Her PCP gave her topical HCT which she has used and found that it resolved this lesion.  She has been using HCT for the rash on her face and it is somewhat helpful.  She does have a spot on her middle back which is extremely itchy.  She is not sure that there is a rash there, but always feels itchy in the same spot.  She did have shoulder surgery on her left shoulder.  She also has spring and fall allergies-will take an OTC AH as needed.  If symptoms really bad, will add a nasal spray but unsure which. She feels this controls symptoms and is not interested in environmental allergy testing.   Chart Review:  Reviewed PCP notes from referral 0/18/24: "Rash of face- new. Pt reports this tends to occur after eating. She has not found a specific trigger but does find that sxs seem to worsen w/ sugar, dairy, and the sun. Given that areas are red and itchy- and at times urticarial- will refer to Allergy to see if we can find specific triggers."  Other allergy screening: Asthma: no Food allergy: no Medication allergy: yes-none to penicillins Eczema:no History of recurrent infections suggestive of  immunodeficency: no  Past Medical History: Past Medical History:  Diagnosis Date   Hyperlipemia    Hypertension    no current problems, no meds   Pneumonia    Seasonal allergies    Tear of left rotator cuff 10/2022   Medication List:  Current Outpatient Medications  Medication Sig Dispense Refill   Biotin 5000 MCG TABS Take 5,000 mcg by mouth daily.     cetirizine (ZYRTEC) 10 MG chewable tablet Chew 10 mg by mouth daily.     cyanocobalamin 2000 MCG tablet Take 2,000 mcg by mouth daily.     hydrocortisone 2.5 % ointment Apply topically 2 (two) times daily. 28.35 g 0   metroNIDAZOLE (METROGEL) 0.75 % gel Apply 1 Application topically 2 (two) times daily. 45 g 3   rosuvastatin (CRESTOR) 10 MG tablet Take 1 tablet (10 mg total) by mouth daily. 90 tablet 0   Vitamin D, Ergocalciferol, (DRISDOL) 1.25 MG (50000 UNIT) CAPS capsule Take 1 capsule (50,000 Units total) by mouth every 7 (seven) days. 7 capsule 12   No current facility-administered medications for this visit.   Known Allergies:  Allergies  Allergen Reactions   Phentermine Swelling    After 3 days pt says tongue swells and was having trouble with speech   Buspirone Swelling   Bupropion Other (See Comments)    Unknown reaction    Statins Anxiety   Past Surgical History: Past Surgical History:  Procedure Laterality Date  ARTHOSCOPIC ROTAOR CUFF REPAIR Left 11/10/2022   Procedure: LEFT SHOULDER ARTHROSCOPY WITH ROTATOR CUFF REPAIR;  Surgeon: Huel Cote, MD;  Location: [facility] OR;  Service: Orthopedics;  Laterality: Left;   BREAST BIOPSY Right    SHOULDER SURGERY Left 11/10/2022   TUBAL LIGATION     Family History: Family History  Problem Relation Age of Onset   Diabetes Mother    Hypertension Mother    Hypothyroidism Mother    Fibroids Mother    Heart disease Father        cardiomyopathy   Other Father        Cardio Megaly   Heart disease Paternal Uncle    Diabetes Other        father side of family   Eczema  Son    Eczema Grandson    Breast cancer Neg Hx    Immunodeficiency Neg Hx    Asthma Neg Hx    Angioedema Neg Hx    Allergic rhinitis Neg Hx    Social History: Dreah lives in a townhouse built 24 years ago, no water damage, vinyl floors, gas heating, central AC, no roaches, not using just protection on the bedding or pillows, no smoke exposure.  She is a Production designer, theatre/television/film x 6 years.  + HEPA filter in the home.  [place]   ROS:  All other systems negative except as noted per HPI.  Objective:  Blood pressure (!) 156/80, pulse 70, temperature 97.6 F (36.4 C), temperature source Temporal, resp. rate 16, height 5\' 3"  (1.6 m), weight 202 lb (91.6 kg), SpO2 98%. Body mass index is 35.78 kg/m. Physical Exam:  General Appearance:  Alert, cooperative, no distress, appears stated age  Head:  Normocephalic, without obvious abnormality, atraumatic  Eyes:  Conjunctiva clear, EOM's intact  Ears EACs normal bilaterally and normal TMs bilaterally  Nose: Nares normal, normal mucosa and no visible anterior polyps  Throat: Lips, tongue normal; teeth and gums normal, normal posterior oropharynx  Neck: Supple, symmetrical  Lungs:   clear to auscultation bilaterally, Respirations unlabored, no coughing  Heart:  regular rate and rhythm and no murmur, Appears well perfused  Extremities: No edema  Skin: Erythema of bilateral cheeks with some papules, very fine telangiectasias noted on right cheek, one pinpoint erythematous papules in middle of upper back  Neurologic: No gross deficits   Diagnostics: None  Labs:  Lab Orders  No laboratory test(s) ordered today    Assessment and Plan  Rash-suspect rosacea but did consider contact dermatitis. Will treat first for rosacea and if no improvement, consider patch testing.   Rosacea Treatment:  - preventative methods (see below) - topical metronidazole gel-apply twice daily  Rosacea Care: - avoid flushing which can worsen skin  symptoms (extreme temperatures, sunlight, spicy foods, alcohol, exercise, stress) - cleanse skin gently with hypoallegenic cleanser with lukewarm water, wash with your fingers, and avoid harsh scrubbing - avoid irritating topical products such as toners, astringents, and chemical exfoliating agents (eg, alpha hydroxy acids); do not use rough cloths/brushes for exfoliation - daily application of a broad-spectrum sunscreen with a sun protection factor (SPF) of at least 30  -  green-tinted foundation can help to camouflage rash if you are bothered by the skin discoloration  If no better, consider patch testing.    Suspected allergic rhinitis - allergy testing today: deferred due to patient preference - Symptom control: - Consider Nasal Steroid Spray: Best results if used daily. - Options include Flonase (fluticasone), Nasocort (triamcinolone),  Nasonex (mometasome) 1- 2 sprays in each nostril daily.  - All can be purchased over-the-counter if not covered by insurance. - Continue Antihistamine: daily or daily as needed.   -Options include Zyrtec (Cetirizine) 10mg , Claritin (Loratadine) 10mg , Allegra (Fexofenadine) 180mg , or Xyzal (Levocetirinze) 5mg  - Can be purchased over-the-counter if not covered by insurance.  Notalgia paresthetica-itchy spot on back - consider sarna lotion (anti-itch relief) as needed  Follow up : 6-8 weeks, sooner if needed It was a pleasure meeting you in clinic today! Thank you for allowing me to participate in your care.  This note in its entirety was forwarded to the Provider who requested this consultation.  Other:   Thank you for your kind referral. I appreciate the opportunity to take part in Princessa's care. Please do not hesitate to contact me with questions.  Sincerely,  Tonny Bollman, MD [facility] [place]

## 2023-08-03 ENCOUNTER — Encounter: Payer: Self-pay | Admitting: Family Medicine

## 2023-08-03 ENCOUNTER — Ambulatory Visit (INDEPENDENT_AMBULATORY_CARE_PROVIDER_SITE_OTHER): Payer: Commercial Managed Care - PPO | Admitting: Family Medicine

## 2023-08-03 VITALS — BP 122/78 | HR 69 | Temp 98.0°F | Ht 63.0 in | Wt 199.1 lb

## 2023-08-03 DIAGNOSIS — Z6835 Body mass index (BMI) 35.0-35.9, adult: Secondary | ICD-10-CM

## 2023-08-03 DIAGNOSIS — E782 Mixed hyperlipidemia: Secondary | ICD-10-CM

## 2023-08-03 DIAGNOSIS — R030 Elevated blood-pressure reading, without diagnosis of hypertension: Secondary | ICD-10-CM

## 2023-08-03 DIAGNOSIS — Z23 Encounter for immunization: Secondary | ICD-10-CM

## 2023-08-03 DIAGNOSIS — E669 Obesity, unspecified: Secondary | ICD-10-CM | POA: Diagnosis not present

## 2023-08-03 LAB — CBC WITH DIFFERENTIAL/PLATELET
Basophils Absolute: 0 10*3/uL (ref 0.0–0.1)
Basophils Relative: 0.4 % (ref 0.0–3.0)
Eosinophils Absolute: 0.2 10*3/uL (ref 0.0–0.7)
Eosinophils Relative: 3.3 % (ref 0.0–5.0)
HCT: 40 % (ref 36.0–46.0)
Hemoglobin: 13 g/dL (ref 12.0–15.0)
Lymphocytes Relative: 35.5 % (ref 12.0–46.0)
Lymphs Abs: 2.2 10*3/uL (ref 0.7–4.0)
MCHC: 32.4 g/dL (ref 30.0–36.0)
MCV: 87.7 fL (ref 78.0–100.0)
Monocytes Absolute: 0.4 10*3/uL (ref 0.1–1.0)
Monocytes Relative: 6.6 % (ref 3.0–12.0)
Neutro Abs: 3.4 10*3/uL (ref 1.4–7.7)
Neutrophils Relative %: 54.2 % (ref 43.0–77.0)
Platelets: 289 10*3/uL (ref 150.0–400.0)
RBC: 4.56 Mil/uL (ref 3.87–5.11)
RDW: 13.6 % (ref 11.5–15.5)
WBC: 6.3 10*3/uL (ref 4.0–10.5)

## 2023-08-03 LAB — HEPATIC FUNCTION PANEL
ALT: 15 U/L (ref 0–35)
AST: 16 U/L (ref 0–37)
Albumin: 4.2 g/dL (ref 3.5–5.2)
Alkaline Phosphatase: 57 U/L (ref 39–117)
Bilirubin, Direct: 0 mg/dL (ref 0.0–0.3)
Total Bilirubin: 0.4 mg/dL (ref 0.2–1.2)
Total Protein: 6.7 g/dL (ref 6.0–8.3)

## 2023-08-03 LAB — LIPID PANEL
Cholesterol: 192 mg/dL (ref 0–200)
HDL: 44.3 mg/dL (ref >39.00)
LDL Cholesterol: 109 mg/dL — ABNORMAL HIGH (ref 0–99)
NonHDL: 147.58
Total CHOL/HDL Ratio: 4
Triglycerides: 194 mg/dL — ABNORMAL HIGH (ref 0.0–149.0)
VLDL: 38.8 mg/dL (ref 0.0–40.0)

## 2023-08-03 LAB — BASIC METABOLIC PANEL
BUN: 8 mg/dL (ref 6–23)
CO2: 28 meq/L (ref 19–32)
Calcium: 9.3 mg/dL (ref 8.4–10.5)
Chloride: 104 meq/L (ref 96–112)
Creatinine, Ser: 0.68 mg/dL (ref 0.40–1.20)
GFR: 106.03 mL/min (ref >60.00)
Glucose, Bld: 102 mg/dL — ABNORMAL HIGH (ref 70–99)
Potassium: 3.5 meq/L (ref 3.5–5.1)
Sodium: 140 meq/L (ref 135–145)

## 2023-08-03 LAB — TSH: TSH: 2.47 u[IU]/mL (ref 0.35–5.50)

## 2023-08-03 NOTE — Assessment & Plan Note (Signed)
Chronic problem.  On Crestor 10mg  daily w/o difficulty.  Check labs.  Adjust meds prn

## 2023-08-03 NOTE — Patient Instructions (Signed)
Schedule your complete physical in 6 months We'll notify you of your lab results and make any changes if needed Continue to work on healthy diet and regular exercise- you can do it! Call with any questions or concerns Stay Safe!  Stay Healthy! Happy Fall!!!

## 2023-08-03 NOTE — Progress Notes (Signed)
   Subjective:    Patient ID: Rose Bell, female    DOB: 09-Jan-1981, 42 y.o.   MRN: [id number]  HPI Hyperlipidemia- chronic problem, on Crestor 10mg  daily.  No abd pain, N/V.  Elevated BP- BP is back in normal range today.  No CP, SOB, HA's, visual changes,  Obesity- ongoing issue for pt.  BMI 35.27.  has decreased amount of exercise b/c she's taking care of father-in-law.  This is week 8.  He will be returning home and she plans to resume previous level of activity.   Review of Systems For ROS see HPI     Objective:   Physical Exam Vitals reviewed.  Constitutional:      General: She is not in acute distress.    Appearance: Normal appearance. She is well-developed. She is not ill-appearing.  HENT:     Head: Normocephalic and atraumatic.  Eyes:     Conjunctiva/sclera: Conjunctivae normal.     Pupils: Pupils are equal, round, and reactive to light.  Neck:     Thyroid: No thyromegaly.  Cardiovascular:     Rate and Rhythm: Normal rate and regular rhythm.     Heart sounds: Normal heart sounds. No murmur heard. Pulmonary:     Effort: Pulmonary effort is normal. No respiratory distress.     Breath sounds: Normal breath sounds.  Abdominal:     General: There is no distension.     Palpations: Abdomen is soft.     Tenderness: There is no abdominal tenderness.  Musculoskeletal:     Cervical back: Normal range of motion and neck supple.     Right lower leg: No edema.     Left lower leg: No edema.  Lymphadenopathy:     Cervical: No cervical adenopathy.  Skin:    General: Skin is warm and dry.  Neurological:     General: No focal deficit present.     Mental Status: She is alert and oriented to person, place, and time.  Psychiatric:        Mood and Affect: Mood normal.        Behavior: Behavior normal.        Thought Content: Thought content normal.           Assessment & Plan:

## 2023-08-03 NOTE — Assessment & Plan Note (Signed)
BP is back in normal range.  No need for meds at this time.  Encouraged her to continue exercising and limiting salt intake.  Will continue to follow.

## 2023-08-03 NOTE — Assessment & Plan Note (Signed)
Ongoing issue for pt.  She has decreased her level of activity as she is now caring for her father in law.  He will be returning home soon and she plans to increase her activity level.  Will check labs to risk stratify.  Will follow.

## 2023-08-04 ENCOUNTER — Telehealth: Payer: Self-pay

## 2023-08-04 NOTE — Telephone Encounter (Signed)
-----   Message from Neena Rhymes sent at 08/04/2023  7:32 AM EDT ----- Labs look great!  No changes at this time

## 2023-08-20 ENCOUNTER — Ambulatory Visit (INDEPENDENT_AMBULATORY_CARE_PROVIDER_SITE_OTHER): Payer: Commercial Managed Care - PPO | Admitting: Internal Medicine

## 2023-08-20 ENCOUNTER — Encounter: Payer: Self-pay | Admitting: Internal Medicine

## 2023-08-20 VITALS — BP 138/84 | HR 83 | Temp 98.4°F | Resp 16 | Wt 199.0 lb

## 2023-08-20 DIAGNOSIS — L719 Rosacea, unspecified: Secondary | ICD-10-CM | POA: Diagnosis not present

## 2023-08-20 DIAGNOSIS — R202 Paresthesia of skin: Secondary | ICD-10-CM

## 2023-08-20 DIAGNOSIS — J310 Chronic rhinitis: Secondary | ICD-10-CM

## 2023-08-20 NOTE — Progress Notes (Signed)
FOLLOW UP Date of Service/Encounter:  08/20/23  Subjective:  Rose Bell (DOB: 1981-05-05) is a 42 y.o. female who [facility] in re-evaluation of the following: Rosacea, allergic rhinitis, notalgia paresthetica History obtained from: chart review and patient.  For Review, LV was on 06/25/23  with Dr.Monaca Wadas seen for intial visit for rosacea, notalgia paresthetica and allergic rhinitis . See below for summary of history and diagnostics.   Therapeutic plans/changes recommended: We started MetroGel, and discussed follow-up to see if improvement.   ----------------------------------------------------- Pertinent History/Diagnostics:  Facial rash/rosacea : Rose Bell has a red and itchy rash on her face. Tends to worsen with sugary foods/high in carbs or with sun exposure. She is only using a dove soap bar and aveeno oat moisturizer on her skin, occasional mascara and lip gloss. No other products. No changes to products. Rash never completely resolves, but does flare up intermittently without any specific pattern. Trigger unknown.  Improved with metrogel, but not completely resolvesd. Chronic Rhinitis:  spring and fall allergies-will take an OTC AH as needed. If symptoms really bad, will add a nasal spray but unsure which. She feels this controls symptoms and is not interested in environmental allergy testing.  -Discussed over-the-counter antihistamine and IN CS as needed, consider environmental allergy testing if symptoms worsen Other:  -Notalgia paresthetica: spot on her middle back which is extremely itchy.  No rash.  She did have shoulder surgery on her left shoulder.  --------------------------------------------------- Today presents for follow-up. Discussed the use of AI scribe software for clinical note transcription with the patient, who gave verbal consent to proceed.  History of Present Illness   The patient, with a history of skin  conditions, reports an improvement in her facial skin condition, with fewer breakouts and less itching. However, she notes the emergence of new breakouts on the upper part of her face. She has been using Metrogel as part of her treatment regimen. The patient also mentions a persistent itchy spot on her back, which occasionally flares up, but is not scabbing as it did previously. She has not been able to apply any topical treatments to this area due to its difficult-to-reach location.  In addition to her skin conditions, the patient also has a history of allergies, which have not been causing any recent issues. She has been managing these with OTC medication, which she has enough supply of until the next year.  The patient's skin condition, suspected to be rosacea, has shown improvement since the initial consultation. However, the patient is still experiencing some breakouts and itching. The patient's back condition, while not visibly concerning, continues to cause discomfort due to occasional intense itching.      All medications reviewed by clinical staff and updated in chart. No new pertinent medical or surgical history except as noted in HPI.  ROS: All others negative except as noted per HPI.   Objective:  BP 138/84 (BP Location: Left Arm, Patient Position: Sitting, Cuff Size: Normal)   Pulse 83   Temp 98.4 F (36.9 C) (Temporal)   Resp 16   Wt 199 lb (90.3 kg)   SpO2 99%   BMI 35.25 kg/m  Body mass index is 35.25 kg/m. Physical Exam: General Appearance:  Alert, cooperative, no distress, appears stated age  Head:  Normocephalic, without obvious abnormality, atraumatic  Eyes:  Conjunctiva clear, EOM's intact  Ears EACs normal bilaterally and normal TMs bilaterally  Nose: Nares normal, hypertrophic turbinates, normal mucosa, and no visible  anterior polyps  Throat: Lips, tongue normal; teeth and gums normal, normal posterior oropharynx  Neck: Supple, symmetrical  Lungs:   clear to  auscultation bilaterally, Respirations unlabored, no coughing  Heart:  regular rate and rhythm and no murmur, Appears well perfused  Extremities: No edema  Skin: Skin color, texture, turgor normal and no rashes or lesions on visualized portions of skin  Neurologic: No gross deficits   Labs:  Lab Orders  No laboratory test(s) ordered today    Assessment/Plan   Rosacea Treatment:  - preventative methods (see below) - topical metronidazole gel-apply twice daily -Referral to dermatology for additional treatment options  Rosacea Care: - avoid flushing which can worsen skin symptoms (extreme temperatures, sunlight, spicy foods, alcohol, exercise, stress) - cleanse skin gently with hypoallegenic cleanser with lukewarm water, wash with your fingers, and avoid harsh scrubbing - avoid irritating topical products such as toners, astringents, and chemical exfoliating agents (eg, alpha hydroxy acids); do not use rough cloths/brushes for exfoliation - daily application of a broad-spectrum sunscreen with a sun protection factor (SPF) of at least 30  -  green-tinted foundation can help to camouflage rash if you are bothered by the skin discoloration  Suspected allergic rhinitis - allergy testing deferred due to patient preference, consider in future symptoms persistent or hard to control - Symptom control: - Consider Nasal Steroid Spray: Best results if used daily. - Options include Flonase (fluticasone), Nasocort (triamcinolone), Nasonex (mometasome) 1- 2 sprays in each nostril daily.  - All can be purchased over-the-counter if not covered by insurance. - Continue Antihistamine: daily or daily as needed.   -Options include Zyrtec (Cetirizine) 10mg , Claritin (Loratadine) 10mg , Allegra (Fexofenadine) 180mg , or Xyzal (Levocetirinze) 5mg  - Can be purchased over-the-counter if not covered by insurance.  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 Thank you for allowing me to participate in your care.  Other: none  Tonny Bollman, MD  [facility]

## 2023-08-20 NOTE — Patient Instructions (Addendum)
Rosacea Treatment:  - preventative methods (see below) - topical metronidazole gel-apply twice daily -Referral to dermatology for additional treatment options  Rosacea Care: - avoid flushing which can worsen skin symptoms (extreme temperatures, sunlight, spicy foods, alcohol, exercise, stress) - cleanse skin gently with hypoallegenic cleanser with lukewarm water, wash with your fingers, and avoid harsh scrubbing - avoid irritating topical products such as toners, astringents, and chemical exfoliating agents (eg, alpha hydroxy acids); do not use rough cloths/brushes for exfoliation - daily application of a broad-spectrum sunscreen with a sun protection factor (SPF) of at least 30  -  green-tinted foundation can help to camouflage rash if you are bothered by the skin discoloration  Suspected allergic rhinitis - allergy testing deferred due to patient preference, consider in future symptoms persistent or hard to control - Symptom control: - Consider Nasal Steroid Spray: Best results if used daily. - Options include Flonase (fluticasone), Nasocort (triamcinolone), Nasonex (mometasome) 1- 2 sprays in each nostril daily.  - All can be purchased over-the-counter if not covered by insurance. - Continue Antihistamine: daily or daily as needed.   -Options include Zyrtec (Cetirizine) 10mg , Claritin (Loratadine) 10mg , Allegra (Fexofenadine) 180mg , or Xyzal (Levocetirinze) 5mg  - Can be purchased over-the-counter if not covered by insurance.  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 Thank you for allowing me to participate in your care.  Tonny Bollman, MD [facility] [place]

## 2023-08-27 ENCOUNTER — Telehealth: Payer: Self-pay

## 2023-08-27 NOTE — Telephone Encounter (Signed)
-----   Message from Verlee Monte sent at 08/20/2023  4:41 PM EDT ----- Please refer to derm for rosacea. thanks

## 2023-09-02 DIAGNOSIS — F4323 Adjustment disorder with mixed anxiety and depressed mood: Secondary | ICD-10-CM | POA: Diagnosis not present

## 2023-09-07 DIAGNOSIS — F4323 Adjustment disorder with mixed anxiety and depressed mood: Secondary | ICD-10-CM | POA: Diagnosis not present

## 2023-09-13 DIAGNOSIS — F4323 Adjustment disorder with mixed anxiety and depressed mood: Secondary | ICD-10-CM | POA: Diagnosis not present

## 2023-09-15 ENCOUNTER — Other Ambulatory Visit: Payer: Self-pay | Admitting: Family Medicine

## 2023-09-15 ENCOUNTER — Other Ambulatory Visit: Payer: Self-pay

## 2023-09-15 ENCOUNTER — Other Ambulatory Visit (HOSPITAL_COMMUNITY): Payer: Self-pay

## 2023-09-15 DIAGNOSIS — E782 Mixed hyperlipidemia: Secondary | ICD-10-CM

## 2023-09-15 MED ORDER — ROSUVASTATIN CALCIUM 10 MG PO TABS
10.0000 mg | ORAL_TABLET | Freq: Every day | ORAL | 0 refills | Status: DC
  Filled 2023-09-15: qty 90, 90d supply, fill #0

## 2023-09-28 DIAGNOSIS — F4323 Adjustment disorder with mixed anxiety and depressed mood: Secondary | ICD-10-CM | POA: Diagnosis not present

## 2023-10-04 DIAGNOSIS — F4323 Adjustment disorder with mixed anxiety and depressed mood: Secondary | ICD-10-CM | POA: Diagnosis not present

## 2023-11-02 DIAGNOSIS — F4323 Adjustment disorder with mixed anxiety and depressed mood: Secondary | ICD-10-CM | POA: Diagnosis not present

## 2023-11-08 ENCOUNTER — Ambulatory Visit: Payer: Commercial Managed Care - PPO

## 2023-11-08 ENCOUNTER — Ambulatory Visit: Payer: Commercial Managed Care - PPO | Admitting: Sports Medicine

## 2023-11-08 DIAGNOSIS — M25511 Pain in right shoulder: Secondary | ICD-10-CM

## 2023-11-08 DIAGNOSIS — S46011A Strain of muscle(s) and tendon(s) of the rotator cuff of right shoulder, initial encounter: Secondary | ICD-10-CM

## 2023-11-08 DIAGNOSIS — Z9889 Other specified postprocedural states: Secondary | ICD-10-CM | POA: Diagnosis not present

## 2023-11-08 NOTE — Progress Notes (Signed)
    Procedures performed today:    None.  Independent interpretation of notes and tests performed by another provider:   None.  Brief History, Exam, Impression, and Recommendations:    S/P left rotator cuff repair Doing really well now about a year post rotator cuff repair with Dr. Steward Drone.  Strain of right rotator cuff capsule Pleasant 43 year old female, she went rollerskating with her grandchild, unfortunately slipped and fell backwards onto her right shoulder, this occurred about 4 days ago. Now with some pain over the shoulder but improving. On exam she does have positive impingement signs but excellent strength pointing away from a cuff tear, suspect rotator cuff strain, adding x-rays, home PT, return as needed.    ____________________________________________ Ihor Austin. Benjamin Stain, M.D., ABFM., CAQSM., AME. Primary Care and Sports Medicine [place] [facility] [facility]  Adjunct Professor of Family Medicine  [employer]  Restaurant manager, fast food

## 2023-11-08 NOTE — Assessment & Plan Note (Signed)
Doing really well now about a year post rotator cuff repair with Dr. Steward Drone.

## 2023-11-08 NOTE — Assessment & Plan Note (Signed)
Pleasant 43 year old female, she went rollerskating with her grandchild, unfortunately slipped and fell backwards onto her right shoulder, this occurred about 4 days ago. Now with some pain over the shoulder but improving. On exam she does have positive impingement signs but excellent strength pointing away from a cuff tear, suspect rotator cuff strain, adding x-rays, home PT, return as needed.

## 2023-11-13 ENCOUNTER — Encounter: Payer: Self-pay | Admitting: Sports Medicine

## 2023-11-15 DIAGNOSIS — F4323 Adjustment disorder with mixed anxiety and depressed mood: Secondary | ICD-10-CM | POA: Diagnosis not present

## 2023-11-29 DIAGNOSIS — L718 Other rosacea: Secondary | ICD-10-CM | POA: Diagnosis not present

## 2023-11-29 DIAGNOSIS — F4323 Adjustment disorder with mixed anxiety and depressed mood: Secondary | ICD-10-CM | POA: Diagnosis not present

## 2023-11-29 DIAGNOSIS — D2371 Other benign neoplasm of skin of right lower limb, including hip: Secondary | ICD-10-CM | POA: Diagnosis not present

## 2023-11-29 DIAGNOSIS — R202 Paresthesia of skin: Secondary | ICD-10-CM | POA: Diagnosis not present

## 2023-12-20 ENCOUNTER — Ambulatory Visit: Payer: Commercial Managed Care - PPO | Admitting: Sports Medicine

## 2023-12-29 DIAGNOSIS — F4323 Adjustment disorder with mixed anxiety and depressed mood: Secondary | ICD-10-CM | POA: Diagnosis not present

## 2024-01-05 ENCOUNTER — Other Ambulatory Visit: Payer: Self-pay

## 2024-01-05 ENCOUNTER — Other Ambulatory Visit (HOSPITAL_COMMUNITY): Payer: Self-pay

## 2024-01-05 ENCOUNTER — Other Ambulatory Visit: Payer: Self-pay | Admitting: Family Medicine

## 2024-01-05 DIAGNOSIS — E782 Mixed hyperlipidemia: Secondary | ICD-10-CM

## 2024-01-05 MED ORDER — ROSUVASTATIN CALCIUM 10 MG PO TABS
10.0000 mg | ORAL_TABLET | Freq: Every day | ORAL | 0 refills | Status: DC
  Filled 2024-01-05: qty 90, 90d supply, fill #0

## 2024-04-18 ENCOUNTER — Other Ambulatory Visit: Payer: Self-pay

## 2024-04-18 ENCOUNTER — Other Ambulatory Visit (HOSPITAL_COMMUNITY): Payer: Self-pay

## 2024-04-18 ENCOUNTER — Other Ambulatory Visit: Payer: Self-pay | Admitting: Family Medicine

## 2024-04-18 DIAGNOSIS — E782 Mixed hyperlipidemia: Secondary | ICD-10-CM

## 2024-04-18 MED ORDER — ROSUVASTATIN CALCIUM 10 MG PO TABS
10.0000 mg | ORAL_TABLET | Freq: Every day | ORAL | 0 refills | Status: DC
  Filled 2024-04-18: qty 90, 90d supply, fill #0

## 2024-04-18 NOTE — Telephone Encounter (Signed)
 Patient needs an appointment not seen for non acute in about 1 year

## 2024-04-18 NOTE — Telephone Encounter (Signed)
 Requested Prescriptions   Pending Prescriptions Disp Refills   rosuvastatin  (CRESTOR ) 10 MG tablet 90 tablet 0    Sig: Take 1 tablet (10 mg total) by mouth daily.     Date of patient request: 04/18/2024 Last office visit: 08/03/2023 Upcoming visit: 04/25/2024 Date of last refill: 01/05/2024 Last refill amount: 90

## 2024-04-25 ENCOUNTER — Ambulatory Visit (INDEPENDENT_AMBULATORY_CARE_PROVIDER_SITE_OTHER): Admitting: Family Medicine

## 2024-04-25 ENCOUNTER — Encounter: Payer: Self-pay | Admitting: Family Medicine

## 2024-04-25 VITALS — BP 122/78 | HR 78 | Temp 98.1°F | Ht 63.0 in | Wt 202.4 lb

## 2024-04-25 DIAGNOSIS — Z1159 Encounter for screening for other viral diseases: Secondary | ICD-10-CM

## 2024-04-25 DIAGNOSIS — Z1211 Encounter for screening for malignant neoplasm of colon: Secondary | ICD-10-CM

## 2024-04-25 DIAGNOSIS — E669 Obesity, unspecified: Secondary | ICD-10-CM | POA: Diagnosis not present

## 2024-04-25 DIAGNOSIS — E538 Deficiency of other specified B group vitamins: Secondary | ICD-10-CM | POA: Diagnosis not present

## 2024-04-25 DIAGNOSIS — Z0000 Encounter for general adult medical examination without abnormal findings: Secondary | ICD-10-CM

## 2024-04-25 DIAGNOSIS — E559 Vitamin D deficiency, unspecified: Secondary | ICD-10-CM

## 2024-04-25 LAB — TSH: TSH: 2.89 u[IU]/mL (ref 0.35–5.50)

## 2024-04-25 LAB — CBC WITH DIFFERENTIAL/PLATELET
Basophils Absolute: 0 K/uL (ref 0.0–0.1)
Basophils Relative: 0.4 % (ref 0.0–3.0)
Eosinophils Absolute: 0.2 K/uL (ref 0.0–0.7)
Eosinophils Relative: 3 % (ref 0.0–5.0)
HCT: 38.3 % (ref 36.0–46.0)
Hemoglobin: 12.7 g/dL (ref 12.0–15.0)
Lymphocytes Relative: 35.5 % (ref 12.0–46.0)
Lymphs Abs: 2.3 K/uL (ref 0.7–4.0)
MCHC: 33 g/dL (ref 30.0–36.0)
MCV: 86.6 fl (ref 78.0–100.0)
Monocytes Absolute: 0.5 K/uL (ref 0.1–1.0)
Monocytes Relative: 8 % (ref 3.0–12.0)
Neutro Abs: 3.4 K/uL (ref 1.4–7.7)
Neutrophils Relative %: 53.1 % (ref 43.0–77.0)
Platelets: 261 K/uL (ref 150.0–400.0)
RBC: 4.43 Mil/uL (ref 3.87–5.11)
RDW: 13.3 % (ref 11.5–15.5)
WBC: 6.4 K/uL (ref 4.0–10.5)

## 2024-04-25 LAB — B12 AND FOLATE PANEL
Folate: 10.2 ng/mL (ref >5.9)
Vitamin B-12: 643 pg/mL (ref 211–911)

## 2024-04-25 LAB — HEPATIC FUNCTION PANEL
ALT: 12 U/L (ref 0–35)
AST: 14 U/L (ref 0–37)
Albumin: 4.4 g/dL (ref 3.5–5.2)
Alkaline Phosphatase: 57 U/L (ref 39–117)
Bilirubin, Direct: 0.1 mg/dL (ref 0.0–0.3)
Total Bilirubin: 0.5 mg/dL (ref 0.2–1.2)
Total Protein: 6.6 g/dL (ref 6.0–8.3)

## 2024-04-25 LAB — LIPID PANEL
Cholesterol: 208 mg/dL — ABNORMAL HIGH (ref 0–200)
HDL: 48.6 mg/dL (ref >39.00)
LDL Cholesterol: 134 mg/dL — ABNORMAL HIGH (ref 0–99)
NonHDL: 159.04
Total CHOL/HDL Ratio: 4
Triglycerides: 125 mg/dL (ref 0.0–149.0)
VLDL: 25 mg/dL (ref 0.0–40.0)

## 2024-04-25 LAB — BASIC METABOLIC PANEL WITH GFR
BUN: 11 mg/dL (ref 6–23)
CO2: 28 meq/L (ref 19–32)
Calcium: 9.2 mg/dL (ref 8.4–10.5)
Chloride: 105 meq/L (ref 96–112)
Creatinine, Ser: 0.71 mg/dL (ref 0.40–1.20)
GFR: 102.99 mL/min (ref >60.00)
Glucose, Bld: 88 mg/dL (ref 70–99)
Potassium: 4.3 meq/L (ref 3.5–5.1)
Sodium: 139 meq/L (ref 135–145)

## 2024-04-25 LAB — VITAMIN D 25 HYDROXY (VIT D DEFICIENCY, FRACTURES): VITD: 25.13 ng/mL — ABNORMAL LOW (ref 30.00–100.00)

## 2024-04-25 NOTE — Progress Notes (Signed)
   Subjective:    Patient ID: Rose Bell, female    DOB: 08/07/1981, 43 y.o.   MRN: [id number]  HPI CPE- UTD on mammo, pap.  Due for colonoscopy, Tdap.  Patient Care Team    Relationship Specialty Notifications Start End  Mahlon Comer BRAVO, MD PCP - General   10/01/10   [facility]    08/03/23      Health Maintenance  Topic Date Due   Hepatitis C Screening  Never done   Hepatitis B Vaccines (1 of 3 - 19+ 3-dose series) Never done   HPV VACCINES (1 - 3-dose SCDM series) Never done   COVID-19 Vaccine (3 - 2024-25 season) 06/20/2023   DTaP/Tdap/Td (6 - Td or Tdap) 01/23/2024   Colonoscopy  Never done   MAMMOGRAM  05/05/2024   INFLUENZA VACCINE  05/19/2024   Cervical Cancer Screening (HPV/Pap Cotest)  12/05/2024   HIV Screening  Completed   Meningococcal B Vaccine  Aged Out      Review of Systems Patient reports no vision/ hearing changes, adenopathy,fever, weight change,  persistant/recurrent hoarseness , swallowing issues, chest pain, palpitations, edema, persistant/recurrent cough, hemoptysis, dyspnea (rest/exertional/paroxysmal nocturnal), gastrointestinal bleeding (melena, rectal bleeding), abdominal pain, significant heartburn, bowel changes, GU symptoms (dysuria, hematuria, incontinence), Gyn symptoms (abnormal  bleeding, pain),  syncope, focal weakness, memory loss, numbness & tingling, skin/hair/nail changes, abnormal bruising or bleeding, anxiety, or depression.     Objective:   Physical Exam General Appearance:    Alert, cooperative, no distress, appears stated age, obese  Head:    Normocephalic, without obvious abnormality, atraumatic  Eyes:    PERRL, conjunctiva/corneas clear, EOM's intact both eyes  Ears:    Normal TM's and external ear canals, both ears  Nose:   Nares normal, septum midline, mucosa normal, no drainage    or sinus tenderness  Throat:   Lips, mucosa, and tongue normal; teeth and gums normal  Neck:    Supple, symmetrical, trachea midline, no adenopathy;    Thyroid : no enlargement/tenderness/nodules  Back:     Symmetric, no curvature, ROM normal, no CVA tenderness  Lungs:     Clear to auscultation bilaterally, respirations unlabored  Chest Wall:    No tenderness or deformity   Heart:    Regular rate and rhythm, S1 and S2 normal, no murmur, rub   or gallop  Breast Exam:    Deferred to GYN  Abdomen:     Soft, non-tender, bowel sounds active all four quadrants,    no masses, no organomegaly  Genitalia:    Deferred to GYN  Rectal:    Extremities:   Extremities normal, atraumatic, no cyanosis or edema  Pulses:   2+ and symmetric all extremities  Skin:   Skin color, texture, turgor normal, no rashes or lesions  Lymph nodes:   Cervical, supraclavicular, and axillary nodes normal  Neurologic:   CNII-XII intact, normal strength, sensation and reflexes    throughout          Assessment & Plan:

## 2024-04-25 NOTE — Patient Instructions (Signed)
 Follow up in 6 months to recheck blood pressure and cholesterol We'll notify you of your lab results and make any changes if needed Keep up the good work on healthy diet and regular exercise- you can do it! Schedule your mammogram at your convenience Call with any questions or concerns Stay Safe! Stay Healthy! HAPPY BIRTHDAY!!!

## 2024-04-25 NOTE — Assessment & Plan Note (Signed)
 Pt's PE WNL w/ exception of BMI.  UTD on mammo, pap.  Referral for colonoscopy sent.  Will get Tdap records from [employer] [facility].  Check labs.  Anticipatory guidance provided.

## 2024-04-26 ENCOUNTER — Ambulatory Visit: Payer: Self-pay | Admitting: Family Medicine

## 2024-04-26 ENCOUNTER — Other Ambulatory Visit: Payer: Self-pay

## 2024-04-26 LAB — HEPATITIS C ANTIBODY: Hepatitis C Ab: NONREACTIVE

## 2024-04-26 MED ORDER — VITAMIN D (ERGOCALCIFEROL) 1.25 MG (50000 UNIT) PO CAPS: 50000.0000 [IU] | ORAL_CAPSULE | ORAL | 0 refills | Status: DC

## 2024-04-26 NOTE — Progress Notes (Signed)
 Vitamin D has been sent in Pt has reviewed labs via MyChart

## 2024-05-20 ENCOUNTER — Other Ambulatory Visit: Payer: Self-pay | Admitting: Family Medicine

## 2024-05-20 ENCOUNTER — Ambulatory Visit
Admission: RE | Admit: 2024-05-20 | Discharge: 2024-05-20 | Disposition: A | Discharge status: Home / Self Care | Source: Ambulatory Visit | Attending: Family Medicine | Admitting: Family Medicine

## 2024-05-20 DIAGNOSIS — Z1231 Encounter for screening mammogram for malignant neoplasm of breast: Secondary | ICD-10-CM

## 2024-06-16 ENCOUNTER — Encounter: Payer: Self-pay | Admitting: Family Medicine

## 2024-06-16 NOTE — Telephone Encounter (Signed)
 Patient would like gastro referral sent to another location. Please advise, thank you

## 2024-06-20 ENCOUNTER — Encounter: Payer: Self-pay | Admitting: Sports Medicine

## 2024-07-17 DIAGNOSIS — K648 Other hemorrhoids: Secondary | ICD-10-CM | POA: Diagnosis not present

## 2024-07-17 DIAGNOSIS — Z1211 Encounter for screening for malignant neoplasm of colon: Secondary | ICD-10-CM | POA: Diagnosis not present

## 2024-07-19 ENCOUNTER — Other Ambulatory Visit: Payer: Self-pay

## 2024-07-19 ENCOUNTER — Other Ambulatory Visit (HOSPITAL_COMMUNITY): Payer: Self-pay

## 2024-07-19 ENCOUNTER — Other Ambulatory Visit: Payer: Self-pay | Admitting: Family Medicine

## 2024-07-19 ENCOUNTER — Encounter (HOSPITAL_COMMUNITY): Payer: Self-pay

## 2024-07-19 DIAGNOSIS — E782 Mixed hyperlipidemia: Secondary | ICD-10-CM

## 2024-07-19 MED ORDER — ROSUVASTATIN CALCIUM 10 MG PO TABS
10.0000 mg | ORAL_TABLET | Freq: Every day | ORAL | 0 refills | Status: DC
  Filled 2024-07-19: qty 90, 90d supply, fill #0

## 2024-10-26 ENCOUNTER — Ambulatory Visit: Admitting: Family Medicine

## 2024-10-26 ENCOUNTER — Encounter: Payer: Self-pay | Admitting: Family Medicine

## 2024-10-26 VITALS — BP 132/88 | HR 80 | Wt 205.0 lb

## 2024-10-26 DIAGNOSIS — E782 Mixed hyperlipidemia: Secondary | ICD-10-CM | POA: Diagnosis not present

## 2024-10-26 DIAGNOSIS — R030 Elevated blood-pressure reading, without diagnosis of hypertension: Secondary | ICD-10-CM

## 2024-10-26 LAB — LIPID PANEL
Cholesterol: 205 mg/dL — ABNORMAL HIGH (ref 28–200)
HDL: 52.3 mg/dL
LDL Cholesterol: 132 mg/dL — ABNORMAL HIGH (ref 10–99)
NonHDL: 152.75
Total CHOL/HDL Ratio: 4
Triglycerides: 105 mg/dL (ref 10.0–149.0)
VLDL: 21 mg/dL (ref 0.0–40.0)

## 2024-10-26 LAB — BASIC METABOLIC PANEL WITH GFR
BUN: 16 mg/dL (ref 6–23)
CO2: 27 meq/L (ref 19–32)
Calcium: 9.1 mg/dL (ref 8.4–10.5)
Chloride: 106 meq/L (ref 96–112)
Creatinine, Ser: 0.66 mg/dL (ref 0.40–1.20)
GFR: 105.88 mL/min
Glucose, Bld: 102 mg/dL — ABNORMAL HIGH (ref 70–99)
Potassium: 4.3 meq/L (ref 3.5–5.1)
Sodium: 140 meq/L (ref 135–145)

## 2024-10-26 LAB — TSH: TSH: 2.49 u[IU]/mL (ref 0.35–5.50)

## 2024-10-26 LAB — HEPATIC FUNCTION PANEL
ALT: 12 U/L (ref 3–35)
AST: 15 U/L (ref 5–37)
Albumin: 4.3 g/dL (ref 3.5–5.2)
Alkaline Phosphatase: 52 U/L (ref 39–117)
Bilirubin, Direct: 0.1 mg/dL (ref 0.1–0.3)
Total Bilirubin: 0.3 mg/dL (ref 0.2–1.2)
Total Protein: 6.6 g/dL (ref 6.0–8.3)

## 2024-10-26 LAB — CBC WITH DIFFERENTIAL/PLATELET
Basophils Absolute: 0 K/uL (ref 0.0–0.1)
Basophils Relative: 0.4 % (ref 0.0–3.0)
Eosinophils Absolute: 0.3 K/uL (ref 0.0–0.7)
Eosinophils Relative: 4.6 % (ref 0.0–5.0)
HCT: 37.4 % (ref 36.0–46.0)
Hemoglobin: 12.4 g/dL (ref 12.0–15.0)
Lymphocytes Relative: 34.7 % (ref 12.0–46.0)
Lymphs Abs: 2.5 K/uL (ref 0.7–4.0)
MCHC: 33.3 g/dL (ref 30.0–36.0)
MCV: 85.9 fl (ref 78.0–100.0)
Monocytes Absolute: 0.5 K/uL (ref 0.1–1.0)
Monocytes Relative: 7.3 % (ref 3.0–12.0)
Neutro Abs: 3.8 K/uL (ref 1.4–7.7)
Neutrophils Relative %: 53 % (ref 43.0–77.0)
Platelets: 247 K/uL (ref 150.0–400.0)
RBC: 4.35 Mil/uL (ref 3.87–5.11)
RDW: 15.5 % (ref 11.5–15.5)
WBC: 7.1 K/uL (ref 4.0–10.5)

## 2024-10-26 NOTE — Progress Notes (Signed)
" ° °  Subjective:    Patient ID: Rose Bell, female    DOB: 03/01/1981, 44 y.o.   MRN: [id number]  HPI Hyperlipidemia- chronic problem, on Crestor  10mg  daily.  No abd pain, N/V.  Obesity- ongoing issue.  Up 3 lbs since July.  Pt reports decreased exercise over the holiday season but is back at it this week.  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  No CP, SOB, HA's, visual changes, edema.   Review of Systems For ROS see HPI     Objective:   Physical Exam Vitals reviewed.  Constitutional:      General: She is not in acute distress.    Appearance: Normal appearance. She is well-developed. She is not ill-appearing.  HENT:     Head: Normocephalic and atraumatic.  Eyes:     Conjunctiva/sclera: Conjunctivae normal.     Pupils: Pupils are equal, round, and reactive to light.  Neck:     Thyroid : No thyromegaly.  Cardiovascular:     Rate and Rhythm: Normal rate and regular rhythm.     Pulses: Normal pulses.     Heart sounds: Normal heart sounds. No murmur heard. Pulmonary:     Effort: Pulmonary effort is normal. No respiratory distress.     Breath sounds: Normal breath sounds.  Abdominal:     General: There is no distension.     Palpations: Abdomen is soft.     Tenderness: There is no abdominal tenderness.  Musculoskeletal:     Cervical back: Normal range of motion and neck supple.     Right lower leg: No edema.     Left lower leg: No edema.  Lymphadenopathy:     Cervical: No cervical adenopathy.  Skin:    General: Skin is warm and dry.  Neurological:     General: No focal deficit present.     Mental Status: She is alert and oriented to person, place, and time.  Psychiatric:        Mood and Affect: Mood normal.        Behavior: Behavior normal.        Thought Content: Thought content normal.           Assessment & Plan:    "

## 2024-10-26 NOTE — Assessment & Plan Note (Signed)
Chronic problem.  Currently on Crestor 10mg daily w/o difficulty.  Check labs.  Adjust meds prn  

## 2024-10-26 NOTE — Assessment & Plan Note (Signed)
 Ongoing issue for pt.  Up 3 lbs since July but this was likely holiday eating.  Her BMI of 36.31 coupled w/ hyperlipidemia qualifies as morbidly obese.  Encouraged low carb diet and regular exercise.  Will follow.

## 2024-10-26 NOTE — Assessment & Plan Note (Signed)
 Pt has hx of similar.  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  BP had decreased somewhat prior to leaving.  Will follow.

## 2024-10-26 NOTE — Patient Instructions (Signed)
 Schedule your complete physical in 6 months We'll notify you of your lab results and make any changes if needed Continue to work on healthy diet and regular exercise- you can do it! Call with any questions or concerns Stay Safe!  Stay Healthy! Happy New Year!!!

## 2024-10-27 ENCOUNTER — Ambulatory Visit: Payer: Self-pay | Admitting: Family Medicine

## 2024-10-27 NOTE — Telephone Encounter (Signed)
 Called patient and she stated she just wanted to make sure there was nothing else that needed to be discussed because no one usually calls her to read the lab results to her. I explained that if we noticed by the middle of the day to the end of the day if you have not read the message we will call just to make sure you receive this information. Patient verbalized understanding.

## 2024-10-27 NOTE — Telephone Encounter (Unsigned)
 Copied from CRM #[id number]. Topic: Clinical - Lab/Test Results >> Oct 27, 2024  2:23 PM Alfonso ORN wrote: Reason for CRM: relayed lab results message to pt. Pt said generally something like that would just be in mychart and is requesting to speak to cma directly regarding labs . Please call pt back

## 2024-10-31 ENCOUNTER — Other Ambulatory Visit: Payer: Self-pay | Admitting: Family Medicine

## 2024-10-31 DIAGNOSIS — E782 Mixed hyperlipidemia: Secondary | ICD-10-CM

## 2024-11-01 ENCOUNTER — Other Ambulatory Visit: Payer: Self-pay

## 2024-11-01 MED ORDER — ROSUVASTATIN CALCIUM 10 MG PO TABS
10.0000 mg | ORAL_TABLET | Freq: Every day | ORAL | 0 refills | Status: AC
  Filled 2024-11-01: qty 90, 90d supply, fill #0

## 2025-04-30 ENCOUNTER — Encounter: Admitting: Family Medicine
